# Patient Record
Sex: Female | Born: 1940 | Race: Black or African American | Hispanic: No | Smoking: Never smoker
Health system: Southern US, Community
[De-identification: ages and names within clinical notes are randomized; demographics above are authoritative.]

## PROBLEM LIST (undated history)

## (undated) DIAGNOSIS — E079 Disorder of thyroid, unspecified: Secondary | ICD-10-CM

## (undated) DIAGNOSIS — K219 Gastro-esophageal reflux disease without esophagitis: Secondary | ICD-10-CM

## (undated) DIAGNOSIS — I10 Essential (primary) hypertension: Secondary | ICD-10-CM

## (undated) DIAGNOSIS — Z974 Presence of external hearing-aid: Secondary | ICD-10-CM

## (undated) DIAGNOSIS — J45909 Unspecified asthma, uncomplicated: Secondary | ICD-10-CM

## (undated) DIAGNOSIS — Z972 Presence of dental prosthetic device (complete) (partial): Secondary | ICD-10-CM

---

## 2011-02-24 ENCOUNTER — Encounter (HOSPITAL_BASED_OUTPATIENT_CLINIC_OR_DEPARTMENT_OTHER): Payer: Self-pay | Admitting: *Deleted

## 2011-02-24 ENCOUNTER — Emergency Department (HOSPITAL_BASED_OUTPATIENT_CLINIC_OR_DEPARTMENT_OTHER)
Admission: EM | Admit: 2011-02-24 | Discharge: 2011-02-24 | Disposition: A | Discharge status: Home / Self Care | Payer: Medicare Other | Attending: Emergency Medicine | Admitting: Emergency Medicine

## 2011-02-24 DIAGNOSIS — R040 Epistaxis: Secondary | ICD-10-CM | POA: Insufficient documentation

## 2011-02-24 DIAGNOSIS — I10 Essential (primary) hypertension: Secondary | ICD-10-CM | POA: Insufficient documentation

## 2011-02-24 DIAGNOSIS — E079 Disorder of thyroid, unspecified: Secondary | ICD-10-CM | POA: Insufficient documentation

## 2011-02-24 HISTORY — DX: Essential (primary) hypertension: I10

## 2011-02-24 HISTORY — DX: Disorder of thyroid, unspecified: E07.9

## 2011-02-24 MED ORDER — SILVER NITRATE-POT NITRATE 75-25 % EX MISC
1.0000 | Freq: Once | CUTANEOUS | Status: AC
  Administered 2011-02-24: 1 via TOPICAL

## 2011-02-24 MED ORDER — LIDOCAINE-EPINEPHRINE 2 %-1:100000 IJ SOLN
20.0000 mL | Freq: Once | INTRAMUSCULAR | Status: AC
  Administered 2011-02-24: 1 mL

## 2011-02-24 MED ORDER — SILVER NITRATE-POT NITRATE 75-25 % EX MISC
CUTANEOUS | Status: AC
  Filled 2011-02-24: qty 1

## 2011-02-24 MED ORDER — LIDOCAINE-EPINEPHRINE 2 %-1:100000 IJ SOLN
INTRAMUSCULAR | Status: AC
  Filled 2011-02-24: qty 1

## 2011-02-24 NOTE — ED Provider Notes (Signed)
History     CSN: [id number]  Arrival date & time 02/24/11  7829   First MD Initiated Contact with Patient 02/24/11 [phone]      Chief Complaint  Patient presents with  . Epistaxis    (Consider location/radiation/quality/duration/timing/severity/associated sxs/prior treatment) HPI Comments: Pt has hx of intermittent brief nose bleeds over the last few years presents with c/o R sided anterior nose bleed which started just after awakening.  The bleeding is mild, persistent, better with holding pressure, associated with a slight amount of blood dripping down the throat into the stomach. She is on intermittent baby aspirin. She denies other anticoagulation, denies trauma, denies picking.  Patient is a 71 y.o. female presenting with nosebleeds. The history is provided by the patient and a relative.  Epistaxis     Past Medical History  Diagnosis Date  . Thyroid disease   . Hypertension     History reviewed. No pertinent past surgical history.  No family history on file.  History  Substance Use Topics  . Smoking status: Never Smoker   . Smokeless tobacco: Not on file  . Alcohol Use:     OB History    Grav Para Term Preterm Abortions TAB SAB Ect Mult Living                  Review of Systems  HENT: Positive for nosebleeds.   Respiratory: Negative for cough and chest tightness.   Cardiovascular: Negative for chest pain.  Gastrointestinal: Negative for nausea, vomiting and diarrhea.  Skin: Negative for rash.  Neurological: Negative for headaches.    Allergies  Fish oil  Home Medications   Current Outpatient Rx  Name Route Sig Dispense Refill  . ASPIRIN 81 MG PO TABS Oral Take 160 mg by mouth daily.    Marland Kitchen ESOMEPRAZOLE MAGNESIUM 20 MG PO CPDR Oral Take 20 mg by mouth daily before breakfast.    . LEVOTHYROXINE SODIUM 100 MCG PO TABS Oral Take 100 mcg by mouth daily.    Marland Kitchen LISINOPRIL 10 MG PO TABS Oral Take 10 mg by mouth daily.      BP 162/67  Pulse 87  Resp 18   SpO2 100%  Physical Exam  Nursing note and vitals reviewed. Constitutional: She appears well-developed and well-nourished. No distress.  HENT:  Head: Normocephalic and atraumatic.  Mouth/Throat: Oropharynx is clear and moist. No oropharyngeal exudate.       Right nostril with focal anterior focus of bleed.  Eyes: Conjunctivae and EOM are normal. Pupils are equal, round, and reactive to light. Right eye exhibits no discharge. Left eye exhibits no discharge. No scleral icterus.  Neck: Normal range of motion. Neck supple. No JVD present. No thyromegaly present.  Cardiovascular: Normal rate, regular rhythm, normal heart sounds and intact distal pulses.  Exam reveals no gallop and no friction rub.   No murmur heard. Pulmonary/Chest: Effort normal and breath sounds normal. No respiratory distress. She has no wheezes. She has no rales.  Abdominal: Soft. Bowel sounds are normal. She exhibits no distension and no mass. There is no tenderness.  Musculoskeletal: Normal range of motion. She exhibits no edema and no tenderness.  Lymphadenopathy:    She has no cervical adenopathy.  Neurological: She is alert. Coordination normal.  Skin: Skin is warm and dry. No rash noted. No erythema.  Psychiatric: She has a normal mood and affect. Her behavior is normal.    ED Course  EPISTAXIS MANAGEMENT Date/Time: 02/24/2011 6:50 AM Performed by: Eber Hong D  Authorized by: Eber Hong D Consent: Verbal consent obtained. Risks and benefits: risks, benefits and alternatives were discussed Consent given by: patient Patient understanding: patient states understanding of the procedure being performed Patient identity confirmed: verbally with patient and arm band Time out: Immediately prior to procedure a "time out" was called to verify the correct patient, procedure, equipment, support staff and site/side marked as required. Local anesthetic: lidocaine 2% with epinephrine and topical anesthetic Anesthetic  total: 3 ml Patient sedated: no Treatment site: right anterior Post-procedure assessment: bleeding stopped Treatment complexity: simple Patient tolerance: Patient tolerated the procedure well with no immediate complications. Comments: Focal packing with cotton ball, lidocaine 2% with epi soak, compression. Removed after 20 minutes with cessation. Than focally cauterized with silver nitrate.   (including critical care time)  Labs Reviewed - No data to display No results found.   1. Epistaxis       MDM  Right nostril packed with cotton swabs, 2% lidocaine with epi subcutaneous swabs, PACs remain in place for approximately 20 minutes. When removed bleeding had stopped and focal site seen. Dental silver nitrate applied, good eschar in place, patient stable for discharge and referral to ear nose and throat.        Vida Roller, MD 02/24/11 [phone]

## 2011-02-24 NOTE — ED Notes (Addendum)
Patient has had a nose bleed starting around 6am. Patient is holding pressure. Patient has a history of nosebleeds and has seen an ENT in the past.

## 2012-11-24 ENCOUNTER — Encounter (HOSPITAL_BASED_OUTPATIENT_CLINIC_OR_DEPARTMENT_OTHER): Payer: Self-pay | Admitting: Emergency Medicine

## 2012-11-24 ENCOUNTER — Emergency Department (HOSPITAL_BASED_OUTPATIENT_CLINIC_OR_DEPARTMENT_OTHER)
Admission: EM | Admit: 2012-11-24 | Discharge: 2012-11-24 | Disposition: A | Discharge status: Home / Self Care | Payer: Medicare Other | Attending: Emergency Medicine | Admitting: Emergency Medicine

## 2012-11-24 ENCOUNTER — Emergency Department (HOSPITAL_BASED_OUTPATIENT_CLINIC_OR_DEPARTMENT_OTHER): Payer: Medicare Other

## 2012-11-24 DIAGNOSIS — Z7982 Long term (current) use of aspirin: Secondary | ICD-10-CM | POA: Insufficient documentation

## 2012-11-24 DIAGNOSIS — I10 Essential (primary) hypertension: Secondary | ICD-10-CM | POA: Insufficient documentation

## 2012-11-24 DIAGNOSIS — Z79899 Other long term (current) drug therapy: Secondary | ICD-10-CM | POA: Insufficient documentation

## 2012-11-24 DIAGNOSIS — N390 Urinary tract infection, site not specified: Secondary | ICD-10-CM | POA: Insufficient documentation

## 2012-11-24 DIAGNOSIS — K219 Gastro-esophageal reflux disease without esophagitis: Secondary | ICD-10-CM | POA: Insufficient documentation

## 2012-11-24 DIAGNOSIS — E079 Disorder of thyroid, unspecified: Secondary | ICD-10-CM | POA: Insufficient documentation

## 2012-11-24 HISTORY — DX: Gastro-esophageal reflux disease without esophagitis: K21.9

## 2012-11-24 LAB — COMPREHENSIVE METABOLIC PANEL
ALT: 17 U/L (ref 0–35)
Albumin: 3.9 g/dL (ref 3.5–5.2)
Alkaline Phosphatase: 74 U/L (ref 39–117)
Calcium: 10.1 mg/dL (ref 8.4–10.5)
Potassium: 3.4 mEq/L — ABNORMAL LOW (ref 3.5–5.1)
Sodium: 139 mEq/L (ref 135–145)
Total Protein: 8.3 g/dL (ref 6.0–8.3)

## 2012-11-24 LAB — URINALYSIS, ROUTINE W REFLEX MICROSCOPIC
Hgb urine dipstick: NEGATIVE
Protein, ur: NEGATIVE mg/dL
Urobilinogen, UA: 1 mg/dL (ref 0.0–1.0)

## 2012-11-24 LAB — CBC WITH DIFFERENTIAL/PLATELET
Basophils Relative: 0 % (ref 0–1)
Eosinophils Absolute: 0.1 10*3/uL (ref 0.0–0.7)
MCH: 27.2 pg (ref 26.0–34.0)
MCHC: 33.3 g/dL (ref 30.0–36.0)
Neutrophils Relative %: 74 % (ref 43–77)
Platelets: 251 10*3/uL (ref 150–400)
RDW: 14.9 % (ref 11.5–15.5)

## 2012-11-24 LAB — LIPASE, BLOOD: Lipase: 32 U/L (ref 11–59)

## 2012-11-24 LAB — URINE MICROSCOPIC-ADD ON

## 2012-11-24 MED ORDER — NITROFURANTOIN MONOHYD MACRO 100 MG PO CAPS: 100.0000 mg | ORAL_CAPSULE | Freq: Two times a day (BID) | ORAL | Status: DC

## 2012-11-24 MED ORDER — CEPHALEXIN 250 MG/5ML PO SUSR: 500.0000 mg | Freq: Three times a day (TID) | ORAL | Status: AC

## 2012-11-24 NOTE — ED Notes (Signed)
Periumbilical abd pain since 0700 this morning. Tender to touch.  GERD meds have not been working lately.  Burning in throat. Denies N/V/D.

## 2012-11-24 NOTE — ED Provider Notes (Signed)
CSN: [id number]     Arrival date & time 11/24/12  1437 History   First MD Initiated Contact with Patient 11/24/12 1544     Chief Complaint  Patient presents with  . Abdominal Pain   (Consider location/radiation/quality/duration/timing/severity/associated sxs/prior Treatment) HPI Pt presents with c/o mid abdominal pain.  She states pain began this morning and she felt that she needed to pass a bowel movement.  She was able to have a small bowel movement and pain did decrease somewhat.  She denies having fever, vomiting.  Did notice some burning with urination this morning.  She states she takes nexium for GERD but feels this is not working as well as when she first started it.  No chest pain.  No shortness of breath. There are no other associated systemic symptoms, there are no other alleviating or modifying factors.   Past Medical History  Diagnosis Date  . Thyroid disease   . Hypertension   . GERD (gastroesophageal reflux disease)    History reviewed. No pertinent past surgical history. No family history on file. History  Substance Use Topics  . Smoking status: Never Smoker   . Smokeless tobacco: Not on file  . Alcohol Use: No   OB History   Grav Para Term Preterm Abortions TAB SAB Ect Mult Living                 Review of Systems ROS reviewed and all otherwise negative except for mentioned in HPI  Allergies  Fish oil  Home Medications   Current Outpatient Rx  Name  Route  Sig  Dispense  Refill  . furosemide (LASIX) 20 MG tablet   Oral   Take 20 mg by mouth daily.         . montelukast (SINGULAIR) 10 MG tablet   Oral   Take 10 mg by mouth at bedtime.         Marland Kitchen aspirin 81 MG tablet   Oral   Take 160 mg by mouth daily.         Marland Kitchen esomeprazole (NEXIUM) 20 MG capsule   Oral   Take 20 mg by mouth daily before breakfast.         . levothyroxine (SYNTHROID, LEVOTHROID) 100 MCG tablet   Oral   Take 100 mcg by mouth daily.         Marland Kitchen lisinopril  (PRINIVIL,ZESTRIL) 10 MG tablet   Oral   Take 10 mg by mouth daily.         . nitrofurantoin, macrocrystal-monohydrate, (MACROBID) 100 MG capsule   Oral   Take 1 capsule (100 mg total) by mouth 2 (two) times daily.   14 capsule   0    BP 145/71  Pulse 87  Temp(Src) 98.3 F (36.8 C) (Oral)  Resp 16  Ht 5\' 3"  (1.6 m)  Wt 172 lb (78.019 kg)  BMI 30.48 kg/m2  SpO2 98% Vitals reviewed Physical Exam Physical Examination: General appearance - alert, well appearing, and in no distress Mental status - alert, oriented to person, place, and time Eyes - no conjunctival injection, no scleral icterus Mouth - mucous membranes moist, pharynx normal without lesions Chest - clear to auscultation, no wheezes, rales or rhonchi, symmetric air entry Heart - normal rate, regular rhythm, normal S1, S2, no murmurs, rubs, clicks or gallops Abdomen - soft, mild diffuse tenderness to palpation, nondistended, no masses or organomegaly, nabs Extremities - peripheral pulses normal, no pedal edema, no clubbing or cyanosis Skin - normal  coloration and turgor, no rashes  ED Course  Procedures (including critical care time) Labs Review Labs Reviewed  URINALYSIS, ROUTINE W REFLEX MICROSCOPIC - Abnormal; Notable for the following:    APPearance CLOUDY (*)    Bilirubin Urine SMALL (*)    Nitrite POSITIVE (*)    Leukocytes, UA SMALL (*)    All other components within normal limits  URINE MICROSCOPIC-ADD ON - Abnormal; Notable for the following:    Squamous Epithelial / LPF FEW (*)    Bacteria, UA MANY (*)    All other components within normal limits  COMPREHENSIVE METABOLIC PANEL - Abnormal; Notable for the following:    Potassium 3.4 (*)    Glucose, Bld 121 (*)    GFR calc non Af Amer 54 (*)    GFR calc Af Amer 63 (*)    All other components within normal limits  URINE CULTURE  CBC WITH DIFFERENTIAL  LIPASE, BLOOD   Imaging Review Dg Abd 2 Views  11/24/2012   CLINICAL DATA:  Abdominal pain.   EXAM: ABDOMEN - 2 VIEW  COMPARISON:  None.  FINDINGS: Calcifications in the pelvis compatible with phleboliths. Nonobstructive bowel gas pattern. No free air, organomegaly or acute bony abnormality. Left basilar scarring or atelectasis. Right lung base is clear.  IMPRESSION: No evidence of bowel obstruction or free air.   Electronically Signed   By: Charlett Nose M.D.   On: 11/24/2012 16:28      EKG Interpretation     Ventricular Rate:  95 PR Interval:    QRS Duration: 140 QT Interval:  384 QTC Calculation: 482 R Axis:   -70 Text Interpretation:  Wide QRS rhythm with occasional Premature ventricular complexes Right bundle branch block Left anterior fascicular block Left ventricular hypertrophy with repolarization abnormality Cannot rule out Septal infarct , age undetermined Abnormal ECG No old tracing to compare               MDM   1. Urinary tract infection    Pt presenting with c/o diffuse abdominal pain.  Labs reassuring, no significant tenderness on abdominal exam.  UA c/w UTI.  Will start on macrobid.  xrays also reaassuring.  Xray images reviewed and interpreted by me.  Low suspicion of acute emergent condition requiring further treatment in the ED.  Close f/u with PMD recommended.  Discharged with strict return precautions.  Pt agreeable with plan.    Ethelda Chick, MD 11/24/12 [phone]

## 2012-11-24 NOTE — ED Notes (Signed)
Pt. Reports her son drove her here.

## 2012-11-25 LAB — URINE CULTURE: Colony Count: >=100000

## 2013-04-30 ENCOUNTER — Emergency Department (HOSPITAL_BASED_OUTPATIENT_CLINIC_OR_DEPARTMENT_OTHER): Payer: Medicare Other

## 2013-04-30 ENCOUNTER — Emergency Department (HOSPITAL_BASED_OUTPATIENT_CLINIC_OR_DEPARTMENT_OTHER)
Admission: EM | Admit: 2013-04-30 | Discharge: 2013-04-30 | Disposition: A | Discharge status: Home / Self Care | Payer: Medicare Other | Attending: Emergency Medicine | Admitting: Emergency Medicine

## 2013-04-30 ENCOUNTER — Encounter (HOSPITAL_BASED_OUTPATIENT_CLINIC_OR_DEPARTMENT_OTHER): Payer: Self-pay | Admitting: Emergency Medicine

## 2013-04-30 DIAGNOSIS — I10 Essential (primary) hypertension: Secondary | ICD-10-CM | POA: Insufficient documentation

## 2013-04-30 DIAGNOSIS — N289 Disorder of kidney and ureter, unspecified: Secondary | ICD-10-CM | POA: Insufficient documentation

## 2013-04-30 DIAGNOSIS — J45909 Unspecified asthma, uncomplicated: Secondary | ICD-10-CM | POA: Insufficient documentation

## 2013-04-30 DIAGNOSIS — Z7982 Long term (current) use of aspirin: Secondary | ICD-10-CM | POA: Insufficient documentation

## 2013-04-30 DIAGNOSIS — E079 Disorder of thyroid, unspecified: Secondary | ICD-10-CM | POA: Insufficient documentation

## 2013-04-30 DIAGNOSIS — Z79899 Other long term (current) drug therapy: Secondary | ICD-10-CM | POA: Insufficient documentation

## 2013-04-30 DIAGNOSIS — K219 Gastro-esophageal reflux disease without esophagitis: Secondary | ICD-10-CM | POA: Insufficient documentation

## 2013-04-30 DIAGNOSIS — R42 Dizziness and giddiness: Secondary | ICD-10-CM | POA: Insufficient documentation

## 2013-04-30 HISTORY — DX: Unspecified asthma, uncomplicated: J45.909

## 2013-04-30 LAB — COMPREHENSIVE METABOLIC PANEL
ALBUMIN: 3.9 g/dL (ref 3.5–5.2)
ALT: 16 U/L (ref 0–35)
AST: 26 U/L (ref 0–37)
Alkaline Phosphatase: 77 U/L (ref 39–117)
BUN: 22 mg/dL (ref 6–23)
CALCIUM: 9.8 mg/dL (ref 8.4–10.5)
CO2: 27 mEq/L (ref 19–32)
Chloride: 98 mEq/L (ref 96–112)
Creatinine, Ser: 1.5 mg/dL — ABNORMAL HIGH (ref 0.50–1.10)
GFR calc non Af Amer: 33 mL/min — ABNORMAL LOW (ref >90)
GFR, EST AFRICAN AMERICAN: 38 mL/min — AB (ref >90)
GLUCOSE: 108 mg/dL — AB (ref 70–99)
Potassium: 3.8 mEq/L (ref 3.7–5.3)
SODIUM: 139 meq/L (ref 137–147)
TOTAL PROTEIN: 8 g/dL (ref 6.0–8.3)
Total Bilirubin: 0.4 mg/dL (ref 0.3–1.2)

## 2013-04-30 LAB — TROPONIN I

## 2013-04-30 LAB — CBC WITH DIFFERENTIAL/PLATELET
Basophils Absolute: 0 10*3/uL (ref 0.0–0.1)
Basophils Relative: 1 % (ref 0–1)
EOS ABS: 0.1 10*3/uL (ref 0.0–0.7)
Eosinophils Relative: 3 % (ref 0–5)
HCT: 36 % (ref 36.0–46.0)
HEMOGLOBIN: 11.9 g/dL — AB (ref 12.0–15.0)
Lymphocytes Relative: 31 % (ref 12–46)
Lymphs Abs: 1.6 10*3/uL (ref 0.7–4.0)
MCH: 27.9 pg (ref 26.0–34.0)
MCHC: 33.1 g/dL (ref 30.0–36.0)
MCV: 84.3 fL (ref 78.0–100.0)
Monocytes Absolute: 0.8 10*3/uL (ref 0.1–1.0)
Monocytes Relative: 15 % — ABNORMAL HIGH (ref 3–12)
Neutro Abs: 2.7 10*3/uL (ref 1.7–7.7)
Neutrophils Relative %: 51 % (ref 43–77)
PLATELETS: 237 10*3/uL (ref 150–400)
RBC: 4.27 MIL/uL (ref 3.87–5.11)
RDW: 14.4 % (ref 11.5–15.5)
WBC: 5.2 10*3/uL (ref 4.0–10.5)

## 2013-04-30 LAB — URINALYSIS, ROUTINE W REFLEX MICROSCOPIC
Bilirubin Urine: NEGATIVE
Glucose, UA: NEGATIVE mg/dL
Hgb urine dipstick: NEGATIVE
Ketones, ur: NEGATIVE mg/dL
Leukocytes, UA: NEGATIVE
NITRITE: NEGATIVE
PH: 7 (ref 5.0–8.0)
Protein, ur: NEGATIVE mg/dL
SPECIFIC GRAVITY, URINE: 1.009 (ref 1.005–1.030)
Urobilinogen, UA: 1 mg/dL (ref 0.0–1.0)

## 2013-04-30 MED ORDER — SODIUM CHLORIDE 0.9 % IV BOLUS (SEPSIS)
500.0000 mL | Freq: Once | INTRAVENOUS | Status: AC
  Administered 2013-04-30: 500 mL via INTRAVENOUS

## 2013-04-30 MED ORDER — SODIUM CHLORIDE 0.9 % IV BOLUS (SEPSIS): 250.0000 mL | Freq: Once | INTRAVENOUS | Status: DC

## 2013-04-30 MED ORDER — SODIUM CHLORIDE 0.9 % IV SOLN: INTRAVENOUS | Status: DC

## 2013-04-30 NOTE — ED Notes (Signed)
MD at bedside. 

## 2013-04-30 NOTE — ED Notes (Signed)
Pt resting quielty, describes and episode today of "my feet wouldn't go where I wanted them to go..." pt states episode lasted a few minutes, and has had no c/o since. Denies any ha, numbness, tingling, weakness or other c/o.

## 2013-04-30 NOTE — ED Notes (Signed)
Patient transported to X-ray 

## 2013-04-30 NOTE — Discharge Instructions (Signed)
Dizziness Dizziness is a common problem. It is a feeling of unsteadiness or lightheadedness. You may feel like you are about to faint. Dizziness can lead to injury if you stumble or fall. A person of any age group can suffer from dizziness, but dizziness is more common in older adults. CAUSES  Dizziness can be caused by many different things, including:  Middle ear problems.  Standing for too long.  Infections.  An allergic reaction.  Aging.  An emotional response to something, such as the sight of blood.  Side effects of medicines.  Fatigue.  Problems with circulation or blood pressure.  Excess use of alcohol, medicines, or illegal drug use.  Breathing too fast (hyperventilation).  An arrhythmia or problems with your heart rhythm.  Low red blood cell count (anemia).  Pregnancy.  Vomiting, diarrhea, fever, or other illnesses that cause dehydration.  Diseases or conditions such as Parkinson's disease, high blood pressure (hypertension), diabetes, and thyroid problems.  Exposure to extreme heat. DIAGNOSIS  To find the cause of your dizziness, your caregiver may do a physical exam, lab tests, radiologic imaging scans, or an electrocardiography test (ECG).  TREATMENT  Treatment of dizziness depends on the cause of your symptoms and can vary greatly. HOME CARE INSTRUCTIONS   Drink enough fluids to keep your urine clear or pale yellow. This is especially important in very hot weather. In the elderly, it is also important in cold weather.  If your dizziness is caused by medicines, take them exactly as directed. When taking blood pressure medicines, it is especially important to get up slowly.  Rise slowly from chairs and steady yourself until you feel okay.  In the morning, first sit up on the side of the bed. When this seems okay, stand slowly while holding onto something until you know your balance is fine.  If you need to stand in one place for a long time, be sure to  move your legs often. Tighten and relax the muscles in your legs while standing.  If dizziness continues to be a problem, have someone stay with you for a day or two. Do this until you feel you are well enough to stay alone. Have the person call your caregiver if he or she notices changes in you that are concerning.  Do not drive or use heavy machinery if you feel dizzy.  Do not drink alcohol. SEEK IMMEDIATE MEDICAL CARE IF:   Your dizziness or lightheadedness gets worse.  You feel nauseous or vomit.  You develop problems with talking, walking, weakness, or using your arms, hands, or legs.  You are not thinking clearly or you have difficulty forming sentences. It may take a friend or family member to determine if your thinking is normal.  You develop chest pain, abdominal pain, shortness of breath, or sweating.  Your vision changes.  You notice any bleeding.  You have side effects from medicine that seems to be getting worse rather than better. MAKE SURE YOU:   Understand these instructions.  Will watch your condition.  Will get help right away if you are not doing well or get worse. Document Released: 07/10/2000 Document Revised: 04/08/2011 Document Reviewed: 08/03/2010 [facility] Patient Information 2014 [place], [place].  Workup here was negative. For any significant findings. Return for any newer worse symptoms. If he still experienced some mild dizziness just followup with your regular Dr.  Renal function is a little worse than it has been in the past. Recommend that she have this rechecked next week  by your regular Dr.

## 2013-04-30 NOTE — ED Notes (Signed)
Pt states she was at home when "my feet wouldn't work right and i got dizzy"-A/O-denies pain

## 2013-04-30 NOTE — ED Provider Notes (Signed)
CSN: [id number]     Arrival date & time 04/30/13  1519 History   First MD Initiated Contact with Patient 04/30/13 1538     Chief Complaint  Patient presents with  . Dizziness     (Consider location/radiation/quality/duration/timing/severity/associated sxs/prior Treatment) The history is provided by the patient.   73 year old female with acute onset of some dizziness. Approximately 15 minutes prior to arrival lasting just 10 minutes. Was gone by the time she got here. Not true vertigo the room spinning did not feel like you pass out. No headache no nausea vomiting no fevers no other symptoms. Patient's had this happen in the past as been a while. Patient is followed by [facility]. Patient denies headache did have some generalized weakness but no focal weakness. Stated the date was a little bit off with walking. No neck pain no back pain.  Past Medical History  Diagnosis Date  . Thyroid disease   . Hypertension   . GERD (gastroesophageal reflux disease)   . Asthma    History reviewed. No pertinent past surgical history. No family history on file. History  Substance Use Topics  . Smoking status: Never Smoker   . Smokeless tobacco: Not on file  . Alcohol Use: No   OB History   Grav Para Term Preterm Abortions TAB SAB Ect Mult Living                 Review of Systems  Constitutional: Positive for fatigue. Negative for fever.  HENT: Negative for congestion.   Eyes: Negative for visual disturbance.  Respiratory: Negative for shortness of breath.   Cardiovascular: Negative for chest pain.  Gastrointestinal: Negative for nausea, vomiting and abdominal pain.  Musculoskeletal: Negative for neck pain.  Skin: Negative for rash.  Neurological: Positive for dizziness and weakness. Negative for syncope, speech difficulty, light-headedness, numbness and headaches.      Allergies  Fish oil  Home Medications   Current Outpatient Rx  Name  Route  Sig  Dispense   Refill  . aspirin 81 MG tablet   Oral   Take 160 mg by mouth daily.         Marland Kitchen esomeprazole (NEXIUM) 20 MG capsule   Oral   Take 20 mg by mouth daily before breakfast.         . furosemide (LASIX) 20 MG tablet   Oral   Take 20 mg by mouth daily.         Marland Kitchen levothyroxine (SYNTHROID, LEVOTHROID) 100 MCG tablet   Oral   Take 100 mcg by mouth daily.         Marland Kitchen lisinopril (PRINIVIL,ZESTRIL) 10 MG tablet   Oral   Take 10 mg by mouth daily.         . montelukast (SINGULAIR) 10 MG tablet   Oral   Take 10 mg by mouth at bedtime.          BP 132/70  Pulse 83  Temp(Src) 98.1 F (36.7 C) (Oral)  Resp 20  Ht 5\' 8"  (1.727 m)  Wt 169 lb (76.658 kg)  BMI 25.70 kg/m2  SpO2 100% Physical Exam  Nursing note and vitals reviewed. Constitutional: She is oriented to person, place, and time. She appears well-developed and well-nourished. No distress.  HENT:  Head: Normocephalic and atraumatic.  Eyes: Conjunctivae and EOM are normal. Pupils are equal, round, and reactive to light.  Neck: Normal range of motion.  Cardiovascular: Normal rate, regular rhythm and normal heart sounds.  No murmur heard. Pulmonary/Chest: Effort normal. No respiratory distress.  Abdominal: Soft. Bowel sounds are normal. There is no tenderness.  Neurological: She is alert and oriented to person, place, and time. No cranial nerve deficit. She exhibits normal muscle tone. Coordination normal.  Skin: Skin is warm. No rash noted.    ED Course  Procedures (including critical care time) Labs Review Labs Reviewed  COMPREHENSIVE METABOLIC PANEL - Abnormal; Notable for the following:    Glucose, Bld 108 (*)    Creatinine, Ser 1.50 (*)    GFR calc non Af Amer 33 (*)    GFR calc Af Amer 38 (*)    All other components within normal limits  CBC WITH DIFFERENTIAL - Abnormal; Notable for the following:    Hemoglobin 11.9 (*)    Monocytes Relative 15 (*)    All other components within normal limits  TROPONIN  I  URINALYSIS, ROUTINE W REFLEX MICROSCOPIC   Results for orders placed during the hospital encounter of 04/30/13  COMPREHENSIVE METABOLIC PANEL      Result Value Ref Range   Sodium 139  137 - 147 mEq/L   Potassium 3.8  3.7 - 5.3 mEq/L   Chloride 98  96 - 112 mEq/L   CO2 27  19 - 32 mEq/L   Glucose, Bld 108 (*) 70 - 99 mg/dL   BUN 22  6 - 23 mg/dL   Creatinine, Ser 1.611.50 (*) 0.50 - 1.10 mg/dL   Calcium 9.8  8.4 - 09.610.5 mg/dL   Total Protein 8.0  6.0 - 8.3 g/dL   Albumin 3.9  3.5 - 5.2 g/dL   AST 26  0 - 37 U/L   ALT 16  0 - 35 U/L   Alkaline Phosphatase 77  39 - 117 U/L   Total Bilirubin 0.4  0.3 - 1.2 mg/dL   GFR calc non Af Amer 33 (*) >90 mL/min   GFR calc Af Amer 38 (*) >90 mL/min  TROPONIN I      Result Value Ref Range   Troponin I <0.30  <0.30 ng/mL  URINALYSIS, ROUTINE W REFLEX MICROSCOPIC      Result Value Ref Range   Color, Urine YELLOW  YELLOW   APPearance CLEAR  CLEAR   Specific Gravity, Urine 1.009  1.005 - 1.030   pH 7.0  5.0 - 8.0   Glucose, UA NEGATIVE  NEGATIVE mg/dL   Hgb urine dipstick NEGATIVE  NEGATIVE   Bilirubin Urine NEGATIVE  NEGATIVE   Ketones, ur NEGATIVE  NEGATIVE mg/dL   Protein, ur NEGATIVE  NEGATIVE mg/dL   Urobilinogen, UA 1.0  0.0 - 1.0 mg/dL   Nitrite NEGATIVE  NEGATIVE   Leukocytes, UA NEGATIVE  NEGATIVE  CBC WITH DIFFERENTIAL      Result Value Ref Range   WBC 5.2  4.0 - 10.5 K/uL   RBC 4.27  3.87 - 5.11 MIL/uL   Hemoglobin 11.9 (*) 12.0 - 15.0 g/dL   HCT 04.536.0  40.936.0 - 81.146.0 %   MCV 84.3  78.0 - 100.0 fL   MCH 27.9  26.0 - 34.0 pg   MCHC 33.1  30.0 - 36.0 g/dL   RDW 91.414.4  78.211.5 - 95.615.5 %   Platelets 237  150 - 400 K/uL   Neutrophils Relative % 51  43 - 77 %   Neutro Abs 2.7  1.7 - 7.7 K/uL   Lymphocytes Relative 31  12 - 46 %   Lymphs Abs 1.6  0.7 - 4.0 K/uL  Monocytes Relative 15 (*) 3 - 12 %   Monocytes Absolute 0.8  0.1 - 1.0 K/uL   Eosinophils Relative 3  0 - 5 %   Eosinophils Absolute 0.1  0.0 - 0.7 K/uL   Basophils Relative  1  0 - 1 %   Basophils Absolute 0.0  0.0 - 0.1 K/uL    Imaging Review Dg Chest 2 View  04/30/2013   CLINICAL DATA:  Dizziness  EXAM: CHEST  2 VIEW  COMPARISON:  None.  FINDINGS: There is no edema or consolidation. Heart is upper normal in size with pulmonary vascularity are normal. No adenopathy. There is degenerative change in the thoracic spine.  IMPRESSION: No edema or consolidation.   Electronically Signed   By: Bretta Bang M.D.   On: 04/30/2013 16:38   Ct Head Wo Contrast  04/30/2013   CLINICAL DATA:  Dizziness  EXAM: CT HEAD WITHOUT CONTRAST  TECHNIQUE: Contiguous axial images were obtained from the base of the skull through the vertex without intravenous contrast. Study was obtained within 24 hr of patient's arrival at the emergency department.  COMPARISON:  None.  FINDINGS: There is age related volume loss. There is a cavum septum pellucidum, an anatomic variant. There is no mass, hemorrhage, extra-axial fluid collection, or midline shift. Gray-white compartments appear normal. There is no focal acute infarct appreciable.  Bony calvarium appears intact.  The mastoid air cells are clear.  IMPRESSION: Age related volume loss.  Study otherwise unremarkable.   Electronically Signed   By: Bretta Bang M.D.   On: 04/30/2013 16:36     EKG Interpretation   Date/Time:  Friday April 30 2013 15:36:07 EDT Ventricular Rate:  0 PR Interval:    QRS Duration: 0 QT Interval:  0 QTC Calculation: 0 R Axis:   0 Text Interpretation:   No QRS complexes found, no ECG analysis possible  Confirmed by Leyton Brownlee  MD, Angelos Wasco [phone]) on 04/30/2013 3:42:10 PM Also  confirmed by Deretha Emory  MD, Phill Steck [phone])  on 04/30/2013 3:42:49 PM       Date: 04/30/2013  Rate: 84  Rhythm: normal sinus rhythm  QRS Axis: left  Intervals: normal  ST/T Wave abnormalities: nonspecific ST/T changes and early repolarization  Conduction Disutrbances:right bundle branch block and left anterior fascicular block  Narrative  Interpretation:   Old EKG Reviewed: unchanged EKG is unchanged from 2014. EKG this is above-the-knee use was a blank EKG. EKG was also done today.    MDM   Final diagnoses:  Dizziness  Renal insufficiency   The patient's dizziness symptoms have resolved. Not consistent with true vertigo. Patient's neuro exam without any focal deficits. Symptoms are very brief and lasted 10 minutes at most. Patient has had similar symptoms in the past but not recently. Head CT negative for any evidence of bleed or prior stroke. EKG without any significant arrhythmias. Labs normal no leukocytosis or anemia only exception to the labs would be the creatinine is elevated at 1.5 suggests of her renals insufficiency and repeat the kidney function test will need to be done next week. Patient will return for any new or worse symptoms. Chest x-ray is also negative for pneumonia pneumothorax or pulmonary edema. Patient's troponin was also negative.  Clinically do not think this was related to a stroke or TIA.  Shelda Jakes, MD 04/30/13 [phone]

## 2013-10-30 ENCOUNTER — Emergency Department (HOSPITAL_BASED_OUTPATIENT_CLINIC_OR_DEPARTMENT_OTHER)
Admission: EM | Admit: 2013-10-30 | Discharge: 2013-10-30 | Disposition: A | Discharge status: Home / Self Care | Payer: Medicare Other | Attending: Emergency Medicine | Admitting: Emergency Medicine

## 2013-10-30 ENCOUNTER — Encounter (HOSPITAL_BASED_OUTPATIENT_CLINIC_OR_DEPARTMENT_OTHER): Payer: Self-pay | Admitting: Emergency Medicine

## 2013-10-30 DIAGNOSIS — Z79899 Other long term (current) drug therapy: Secondary | ICD-10-CM | POA: Insufficient documentation

## 2013-10-30 DIAGNOSIS — Z7982 Long term (current) use of aspirin: Secondary | ICD-10-CM | POA: Diagnosis not present

## 2013-10-30 DIAGNOSIS — J45909 Unspecified asthma, uncomplicated: Secondary | ICD-10-CM | POA: Diagnosis not present

## 2013-10-30 DIAGNOSIS — R112 Nausea with vomiting, unspecified: Secondary | ICD-10-CM | POA: Diagnosis present

## 2013-10-30 DIAGNOSIS — N3000 Acute cystitis without hematuria: Secondary | ICD-10-CM | POA: Diagnosis not present

## 2013-10-30 DIAGNOSIS — K219 Gastro-esophageal reflux disease without esophagitis: Secondary | ICD-10-CM | POA: Diagnosis not present

## 2013-10-30 DIAGNOSIS — I10 Essential (primary) hypertension: Secondary | ICD-10-CM | POA: Insufficient documentation

## 2013-10-30 DIAGNOSIS — E079 Disorder of thyroid, unspecified: Secondary | ICD-10-CM | POA: Diagnosis not present

## 2013-10-30 LAB — COMPREHENSIVE METABOLIC PANEL
ALK PHOS: 76 U/L (ref 39–117)
ALT: 12 U/L (ref 0–35)
AST: 20 U/L (ref 0–37)
Albumin: 3.5 g/dL (ref 3.5–5.2)
Anion gap: 15 (ref 5–15)
BILIRUBIN TOTAL: 0.5 mg/dL (ref 0.3–1.2)
BUN: 15 mg/dL (ref 6–23)
CHLORIDE: 102 meq/L (ref 96–112)
CO2: 26 meq/L (ref 19–32)
Calcium: 9.8 mg/dL (ref 8.4–10.5)
Creatinine, Ser: 1 mg/dL (ref 0.50–1.10)
GFR calc Af Amer: 62 mL/min — ABNORMAL LOW (ref >90)
GFR, EST NON AFRICAN AMERICAN: 54 mL/min — AB (ref >90)
Glucose, Bld: 165 mg/dL — ABNORMAL HIGH (ref 70–99)
Potassium: 3.4 mEq/L — ABNORMAL LOW (ref 3.7–5.3)
SODIUM: 143 meq/L (ref 137–147)
Total Protein: 7.7 g/dL (ref 6.0–8.3)

## 2013-10-30 LAB — CBC WITH DIFFERENTIAL/PLATELET
Basophils Absolute: 0 10*3/uL (ref 0.0–0.1)
Basophils Relative: 0 % (ref 0–1)
Eosinophils Absolute: 0 10*3/uL (ref 0.0–0.7)
Eosinophils Relative: 0 % (ref 0–5)
HEMATOCRIT: 38.7 % (ref 36.0–46.0)
Hemoglobin: 12.7 g/dL (ref 12.0–15.0)
LYMPHS PCT: 16 % (ref 12–46)
Lymphs Abs: 1.5 10*3/uL (ref 0.7–4.0)
MCH: 27.3 pg (ref 26.0–34.0)
MCHC: 32.8 g/dL (ref 30.0–36.0)
MCV: 83.2 fL (ref 78.0–100.0)
Monocytes Absolute: 0.6 10*3/uL (ref 0.1–1.0)
Monocytes Relative: 7 % (ref 3–12)
NEUTROS ABS: 6.8 10*3/uL (ref 1.7–7.7)
Neutrophils Relative %: 77 % (ref 43–77)
PLATELETS: 226 10*3/uL (ref 150–400)
RBC: 4.65 MIL/uL (ref 3.87–5.11)
RDW: 14.4 % (ref 11.5–15.5)
WBC: 8.9 10*3/uL (ref 4.0–10.5)

## 2013-10-30 LAB — URINALYSIS, ROUTINE W REFLEX MICROSCOPIC
Bilirubin Urine: NEGATIVE
GLUCOSE, UA: NEGATIVE mg/dL
Hgb urine dipstick: NEGATIVE
Ketones, ur: NEGATIVE mg/dL
Nitrite: POSITIVE — AB
PROTEIN: NEGATIVE mg/dL
Specific Gravity, Urine: 1.017 (ref 1.005–1.030)
UROBILINOGEN UA: 1 mg/dL (ref 0.0–1.0)
pH: 7.5 (ref 5.0–8.0)

## 2013-10-30 LAB — URINE MICROSCOPIC-ADD ON

## 2013-10-30 MED ORDER — ONDANSETRON 4 MG PO TBDP: 4.0000 mg | ORAL_TABLET | Freq: Three times a day (TID) | ORAL | Status: DC | PRN

## 2013-10-30 MED ORDER — CEPHALEXIN 500 MG PO CAPS: 500.0000 mg | ORAL_CAPSULE | Freq: Two times a day (BID) | ORAL | Status: DC

## 2013-10-30 MED ORDER — SODIUM CHLORIDE 0.9 % IV BOLUS (SEPSIS)
500.0000 mL | Freq: Once | INTRAVENOUS | Status: AC
  Administered 2013-10-30: 500 mL via INTRAVENOUS

## 2013-10-30 MED ORDER — CEFTRIAXONE SODIUM 1 G IJ SOLR
INTRAMUSCULAR | Status: AC
  Filled 2013-10-30: qty 10

## 2013-10-30 MED ORDER — DEXTROSE 5 % IV SOLN
1.0000 g | Freq: Once | INTRAVENOUS | Status: AC
  Administered 2013-10-30: 1 g via INTRAVENOUS

## 2013-10-30 MED ORDER — ONDANSETRON HCL 4 MG/2ML IJ SOLN
4.0000 mg | Freq: Once | INTRAMUSCULAR | Status: AC
  Administered 2013-10-30: 4 mg via INTRAVENOUS
  Filled 2013-10-30: qty 2

## 2013-10-30 NOTE — ED Notes (Signed)
Pt sts she ate at about 3:00pm on Friday and then around 7:30, she began vomiting and having abd pain.

## 2013-10-30 NOTE — ED Provider Notes (Signed)
CSN: [id number]     Arrival date & time 10/30/13  0131 History   First MD Initiated Contact with Patient 10/30/13 0143     Chief Complaint  Patient presents with  . Vomiting      (Consider location/radiation/quality/duration/timing/severity/associated sxs/prior Treatment) HPI This is a 73 year old female who started "feeling bad" yesterday afternoon about 3:30 after eating a sandwich. She began vomiting about 6:30 PM yesterday evening. She has vomited 3 times, the most severe episode just prior to arrival. Her nausea has improved. She denies diarrhea. She denies fever. She has had some mild abdominal pain which she describes as "gas".   Past Medical History  Diagnosis Date  . Thyroid disease   . Hypertension   . GERD (gastroesophageal reflux disease)   . Asthma    History reviewed. No pertinent past surgical history. No family history on file. History  Substance Use Topics  . Smoking status: Never Smoker   . Smokeless tobacco: Not on file  . Alcohol Use: No   OB History   Grav Para Term Preterm Abortions TAB SAB Ect Mult Living                 Review of Systems  All other systems reviewed and are negative.   Allergies  Fish oil  Home Medications   Prior to Admission medications   Medication Sig Start Date End Date Taking? Authorizing Provider  aspirin 81 MG tablet Take 160 mg by mouth daily.    Historical Provider, MD  esomeprazole (NEXIUM) 20 MG capsule Take 40 mg by mouth daily before breakfast.     Historical Provider, MD  furosemide (LASIX) 20 MG tablet Take 20 mg by mouth daily.    Historical Provider, MD  levothyroxine (SYNTHROID, LEVOTHROID) 100 MCG tablet Take 250 mcg by mouth daily.     Historical Provider, MD  lisinopril (PRINIVIL,ZESTRIL) 10 MG tablet Take 10 mg by mouth daily.    Historical Provider, MD  montelukast (SINGULAIR) 10 MG tablet Take 10 mg by mouth at bedtime.    Historical Provider, MD   BP 134/69  Pulse 84  Temp(Src) 98.2 F (36.8 C)  (Oral)  Resp 18  Ht 5\' 6"  (1.676 m)  Wt 163 lb (73.936 kg)  BMI 26.32 kg/m2  SpO2 99%  Physical Exam General: Well-developed, well-nourished female in no acute distress; appearance consistent with age of record HENT: normocephalic; atraumatic Eyes: pupils equal, round and reactive to light; extraocular muscles intact; arcus senilis bilaterally; left ptosis Neck: supple Heart: regular rate and rhythm Lungs: clear to auscultation bilaterally Abdomen: soft; nondistended; nontender; no masses or hepatosplenomegaly; bowel sounds present Extremities: No deformity; full range of motion; pulses normal Neurologic: Awake, alert and oriented; motor function intact in all extremities and symmetric; no facial droop Skin: Warm and dry Psychiatric: Normal mood and affect    ED Course  Procedures (including critical care time)  MDM  Nursing notes and vitals signs, including pulse oximetry, reviewed.  Summary of this visit's results, reviewed by myself:  Labs:  Results for orders placed during the hospital encounter of 10/30/13 (from the past 24 hour(s))  URINALYSIS, ROUTINE W REFLEX MICROSCOPIC     Status: Abnormal   Collection Time    10/30/13  1:38 AM      Result Value Ref Range   Color, Urine YELLOW  YELLOW   APPearance CLOUDY (*) CLEAR   Specific Gravity, Urine 1.017  1.005 - 1.030   pH 7.5  5.0 - 8.0  Glucose, UA NEGATIVE  NEGATIVE mg/dL   Hgb urine dipstick NEGATIVE  NEGATIVE   Bilirubin Urine NEGATIVE  NEGATIVE   Ketones, ur NEGATIVE  NEGATIVE mg/dL   Protein, ur NEGATIVE  NEGATIVE mg/dL   Urobilinogen, UA 1.0  0.0 - 1.0 mg/dL   Nitrite POSITIVE (*) NEGATIVE   Leukocytes, UA SMALL (*) NEGATIVE  URINE MICROSCOPIC-ADD ON     Status: Abnormal   Collection Time    10/30/13  1:38 AM      Result Value Ref Range   Squamous Epithelial / LPF FEW (*) RARE   WBC, UA 3-6  <3 WBC/hpf   RBC / HPF 0-2  <3 RBC/hpf   Bacteria, UA MANY (*) RARE   Casts HYALINE CASTS (*) NEGATIVE  CBC  WITH DIFFERENTIAL     Status: None   Collection Time    10/30/13  1:54 AM      Result Value Ref Range   WBC 8.9  4.0 - 10.5 K/uL   RBC 4.65  3.87 - 5.11 MIL/uL   Hemoglobin 12.7  12.0 - 15.0 g/dL   HCT 16.1  09.6 - 04.5 %   MCV 83.2  78.0 - 100.0 fL   MCH 27.3  26.0 - 34.0 pg   MCHC 32.8  30.0 - 36.0 g/dL   RDW 40.9  81.1 - 91.4 %   Platelets 226  150 - 400 K/uL   Neutrophils Relative % 77  43 - 77 %   Neutro Abs 6.8  1.7 - 7.7 K/uL   Lymphocytes Relative 16  12 - 46 %   Lymphs Abs 1.5  0.7 - 4.0 K/uL   Monocytes Relative 7  3 - 12 %   Monocytes Absolute 0.6  0.1 - 1.0 K/uL   Eosinophils Relative 0  0 - 5 %   Eosinophils Absolute 0.0  0.0 - 0.7 K/uL   Basophils Relative 0  0 - 1 %   Basophils Absolute 0.0  0.0 - 0.1 K/uL  COMPREHENSIVE METABOLIC PANEL     Status: Abnormal   Collection Time    10/30/13  1:54 AM      Result Value Ref Range   Sodium 143  137 - 147 mEq/L   Potassium 3.4 (*) 3.7 - 5.3 mEq/L   Chloride 102  96 - 112 mEq/L   CO2 26  19 - 32 mEq/L   Glucose, Bld 165 (*) 70 - 99 mg/dL   BUN 15  6 - 23 mg/dL   Creatinine, Ser 7.82  0.50 - 1.10 mg/dL   Calcium 9.8  8.4 - 95.6 mg/dL   Total Protein 7.7  6.0 - 8.3 g/dL   Albumin 3.5  3.5 - 5.2 g/dL   AST 20  0 - 37 U/L   ALT 12  0 - 35 U/L   Alkaline Phosphatase 76  39 - 117 U/L   Total Bilirubin 0.5  0.3 - 1.2 mg/dL   GFR calc non Af Amer 54 (*) >90 mL/min   GFR calc Af Amer 62 (*) >90 mL/min   Anion gap 15  5 - 15   2:34 AM Patient feeling better after IV fluids and Zofran. Urinalysis is concerning for early UTI. We will treat accordingly.   Hanley Seamen, MD 10/30/13 [phone]

## 2013-11-01 LAB — URINE CULTURE: Colony Count: >=100000

## 2013-11-02 ENCOUNTER — Telehealth (HOSPITAL_BASED_OUTPATIENT_CLINIC_OR_DEPARTMENT_OTHER): Payer: Self-pay | Admitting: Emergency Medicine

## 2013-11-02 NOTE — Telephone Encounter (Signed)
Post ED Visit - Positive Culture Follow-up  Culture report reviewed by antimicrobial stewardship pharmacist: []  Wes Dulaney, Pharm.D., BCPS [x]  Celedonio MiyamotoJeremy Frens, [address] BCPS []  Georgina PillionElizabeth Martin, Pharm.D., BCPS []  GroverMinh Pham, [place]arm[place]D., BCPS, AAHIVP []  Estella HuskMichelle Turner, Pharm.D., BCPS, AAHIVP []  Carly Sabat, Pharm.D. []  Enzo BiNathan Batchelder, [address]  Positive urine culture Klebsiella Treated with Cephalexin 500mg  po caps bid x 5 days, organism sensitive to the same and no further patient follow-up is required at this time.  Berle MullMiller, Tiffanny Lamarche 11/02/2013, 11:00 AM

## 2013-12-03 ENCOUNTER — Encounter (HOSPITAL_BASED_OUTPATIENT_CLINIC_OR_DEPARTMENT_OTHER): Payer: Self-pay | Admitting: *Deleted

## 2013-12-03 ENCOUNTER — Emergency Department (HOSPITAL_BASED_OUTPATIENT_CLINIC_OR_DEPARTMENT_OTHER)
Admission: EM | Admit: 2013-12-03 | Discharge: 2013-12-04 | Disposition: A | Discharge status: Home / Self Care | Payer: Medicare Other | Attending: Emergency Medicine | Admitting: Emergency Medicine

## 2013-12-03 DIAGNOSIS — R52 Pain, unspecified: Secondary | ICD-10-CM

## 2013-12-03 DIAGNOSIS — Z7982 Long term (current) use of aspirin: Secondary | ICD-10-CM | POA: Insufficient documentation

## 2013-12-03 DIAGNOSIS — J45909 Unspecified asthma, uncomplicated: Secondary | ICD-10-CM | POA: Diagnosis not present

## 2013-12-03 DIAGNOSIS — R1084 Generalized abdominal pain: Secondary | ICD-10-CM | POA: Diagnosis present

## 2013-12-03 DIAGNOSIS — E079 Disorder of thyroid, unspecified: Secondary | ICD-10-CM | POA: Diagnosis not present

## 2013-12-03 DIAGNOSIS — K5900 Constipation, unspecified: Secondary | ICD-10-CM | POA: Insufficient documentation

## 2013-12-03 DIAGNOSIS — K219 Gastro-esophageal reflux disease without esophagitis: Secondary | ICD-10-CM | POA: Diagnosis not present

## 2013-12-03 DIAGNOSIS — I10 Essential (primary) hypertension: Secondary | ICD-10-CM | POA: Insufficient documentation

## 2013-12-03 DIAGNOSIS — Z79899 Other long term (current) drug therapy: Secondary | ICD-10-CM | POA: Insufficient documentation

## 2013-12-03 LAB — CBC WITH DIFFERENTIAL/PLATELET
BASOS PCT: 1 % (ref 0–1)
Basophils Absolute: 0 10*3/uL (ref 0.0–0.1)
Eosinophils Absolute: 0 10*3/uL (ref 0.0–0.7)
Eosinophils Relative: 1 % (ref 0–5)
HCT: 35.6 % — ABNORMAL LOW (ref 36.0–46.0)
Hemoglobin: 11.9 g/dL — ABNORMAL LOW (ref 12.0–15.0)
LYMPHS PCT: 28 % (ref 12–46)
Lymphs Abs: 1.4 10*3/uL (ref 0.7–4.0)
MCH: 27.7 pg (ref 26.0–34.0)
MCHC: 33.4 g/dL (ref 30.0–36.0)
MCV: 82.8 fL (ref 78.0–100.0)
Monocytes Absolute: 0.8 10*3/uL (ref 0.1–1.0)
Monocytes Relative: 17 % — ABNORMAL HIGH (ref 3–12)
Neutro Abs: 2.7 10*3/uL (ref 1.7–7.7)
Neutrophils Relative %: 53 % (ref 43–77)
Platelets: 200 10*3/uL (ref 150–400)
RBC: 4.3 MIL/uL (ref 3.87–5.11)
RDW: 14 % (ref 11.5–15.5)
WBC: 5 10*3/uL (ref 4.0–10.5)

## 2013-12-03 LAB — COMPREHENSIVE METABOLIC PANEL
ALT: 14 U/L (ref 0–35)
AST: 25 U/L (ref 0–37)
Albumin: 3.7 g/dL (ref 3.5–5.2)
Alkaline Phosphatase: 73 U/L (ref 39–117)
Anion gap: 13 (ref 5–15)
BUN: 13 mg/dL (ref 6–23)
CHLORIDE: 102 meq/L (ref 96–112)
CO2: 28 meq/L (ref 19–32)
CREATININE: 0.9 mg/dL (ref 0.50–1.10)
Calcium: 9.8 mg/dL (ref 8.4–10.5)
GFR, EST AFRICAN AMERICAN: 71 mL/min — AB (ref >90)
GFR, EST NON AFRICAN AMERICAN: 61 mL/min — AB (ref >90)
GLUCOSE: 115 mg/dL — AB (ref 70–99)
Potassium: 3.2 mEq/L — ABNORMAL LOW (ref 3.7–5.3)
Sodium: 143 mEq/L (ref 137–147)
Total Bilirubin: 0.6 mg/dL (ref 0.3–1.2)
Total Protein: 7.8 g/dL (ref 6.0–8.3)

## 2013-12-03 LAB — URINALYSIS, ROUTINE W REFLEX MICROSCOPIC
BILIRUBIN URINE: NEGATIVE
GLUCOSE, UA: NEGATIVE mg/dL
Hgb urine dipstick: NEGATIVE
KETONES UR: NEGATIVE mg/dL
LEUKOCYTES UA: NEGATIVE
Nitrite: NEGATIVE
Protein, ur: NEGATIVE mg/dL
Specific Gravity, Urine: 1.009 (ref 1.005–1.030)
Urobilinogen, UA: 1 mg/dL (ref 0.0–1.0)
pH: 6.5 (ref 5.0–8.0)

## 2013-12-03 LAB — LIPASE, BLOOD: LIPASE: 45 U/L (ref 11–59)

## 2013-12-03 MED ORDER — GI COCKTAIL ~~LOC~~
30.0000 mL | Freq: Once | ORAL | Status: AC
  Administered 2013-12-03: 30 mL via ORAL
  Filled 2013-12-03: qty 30

## 2013-12-03 MED ORDER — DICYCLOMINE HCL 10 MG/ML IM SOLN
20.0000 mg | Freq: Once | INTRAMUSCULAR | Status: AC
  Administered 2013-12-03: 20 mg via INTRAMUSCULAR
  Filled 2013-12-03: qty 2

## 2013-12-03 NOTE — ED Notes (Addendum)
Mid abdominal burning pain off and on for 2 days. Constipation. Recently treated for a UTI

## 2013-12-03 NOTE — ED Provider Notes (Signed)
CSN: [id number]     Arrival date & time 12/03/13  2102 History  This chart was scribed for Dominique Ochoa Dominique CordsK Cathey Fredenburg-Rasch, MD by Modena JanskyAlbert Thayil, ED Scribe. This patient was seen in room MH04/MH04 and the patient's care was started at 11:04 PM.   Chief Complaint  Patient presents with  . Abdominal Pain   Patient is a 73 y.o. female presenting with abdominal pain. The history is provided by the patient. No language interpreter was used.  Abdominal Pain Pain location:  Generalized Pain quality: burning   Pain radiates to:  Does not radiate Pain severity:  Moderate Duration:  4 days Timing:  Intermittent Chronicity:  New Context: eating   Context: not alcohol use   Relieved by: gaviscon. Worsened by:  Nothing tried Ineffective treatments:  None tried Associated symptoms: constipation   Associated symptoms: no chest pain, no diarrhea, no dysuria, no fever, no nausea, no shortness of breath and no vomiting   Risk factors: not pregnant and no recent hospitalization    HPI Comments: Dominique Ochoa is a 73 y.o. female with a hx of GERD who presents to the Emergency Department complaining of intermittent moderate generalized abdominal pain that started about 3 days ago. She reports that she had an episode 3 days ago and this morning. She rates the severity of her pain currently as a 3/10. She describes the pain as a burning sensation. She states that she took gaviscon with relief. She reports that her last BM was 2 days ago. She denies any diarrhea, or emesis.   Past Medical History  Diagnosis Date  . Thyroid disease   . Hypertension   . GERD (gastroesophageal reflux disease)   . Asthma    History reviewed. No pertinent past surgical history. No family history on file. History  Substance Use Topics  . Smoking status: Never Smoker   . Smokeless tobacco: Not on file  . Alcohol Use: No   OB History    No data available     Review of Systems  Constitutional: Negative for fever.  Respiratory:  Negative for shortness of breath.   Cardiovascular: Negative for chest pain.  Gastrointestinal: Positive for abdominal pain and constipation. Negative for nausea, vomiting and diarrhea.  Genitourinary: Negative for dysuria and difficulty urinating.  All other systems reviewed and are negative.   Allergies  Fish oil  Home Medications   Prior to Admission medications   Medication Sig Start Date End Date Taking? Authorizing Provider  aspirin 81 MG tablet Take 160 mg by mouth daily.    Historical Provider, MD  cephALEXin (KEFLEX) 500 MG capsule Take 1 capsule (500 mg total) by mouth 2 (two) times daily. 10/30/13   John L Molpus, MD  esomeprazole (NEXIUM) 20 MG capsule Take 40 mg by mouth daily before breakfast.     Historical Provider, MD  furosemide (LASIX) 20 MG tablet Take 20 mg by mouth daily.    Historical Provider, MD  levothyroxine (SYNTHROID, LEVOTHROID) 100 MCG tablet Take 250 mcg by mouth daily.     Historical Provider, MD  lisinopril (PRINIVIL,ZESTRIL) 10 MG tablet Take 10 mg by mouth daily.    Historical Provider, MD  montelukast (SINGULAIR) 10 MG tablet Take 10 mg by mouth at bedtime.    Historical Provider, MD  ondansetron (ZOFRAN ODT) 4 MG disintegrating tablet Take 1 tablet (4 mg total) by mouth every 8 (eight) hours as needed for nausea or vomiting. 4mg  ODT q4 hours prn nausea/vomit 10/30/13   Hanley SeamenJohn L Molpus, MD  BP 147/78 mmHg  Pulse 81  Temp(Src) 97.6 F (36.4 C) (Oral)  Resp 20  Ht 5\' 6"  (1.676 m)  Wt 163 lb (73.936 kg)  BMI 26.32 kg/m2  SpO2 100% Physical Exam  Constitutional: She is oriented to person, place, and time. She appears well-developed and well-nourished. No distress.  HENT:  Head: Normocephalic and atraumatic.  Mouth/Throat: Oropharynx is clear and moist.  Moist mucous membranes.   Eyes: EOM are normal. Pupils are equal, round, and reactive to light.  Neck: Normal range of motion. Neck supple. No tracheal deviation present.  Cardiovascular: Normal  rate, regular rhythm and normal heart sounds.  Exam reveals no gallop and no friction rub.   No murmur heard. Pulmonary/Chest: Effort normal and breath sounds normal. No respiratory distress. She has no wheezes. She has no rales.  Abdominal: Soft. She exhibits no distension. There is no tenderness. There is no rebound and no guarding.  Hyperactive bowel sounds. Stool is palpable.   Musculoskeletal: Normal range of motion. She exhibits no edema or tenderness.  Neurological: She is alert and oriented to person, place, and time.  Skin: Skin is warm and dry.  Psychiatric: She has a normal mood and affect. Her behavior is normal.  Nursing note and vitals reviewed.   ED Course  Procedures (including critical care time) DIAGNOSTIC STUDIES: Oxygen Saturation is 100% on RA, normal by my interpretation.    COORDINATION OF CARE: 11:08 PM- Pt advised of plan for treatment which includes medication and labs and pt agrees.  Labs Review Labs Reviewed  URINALYSIS, ROUTINE W REFLEX MICROSCOPIC - Abnormal; Notable for the following:    APPearance CLOUDY (*)    All other components within normal limits  CBC WITH DIFFERENTIAL - Abnormal; Notable for the following:    Hemoglobin 11.9 (*)    HCT 35.6 (*)    Monocytes Relative 17 (*)    All other components within normal limits  COMPREHENSIVE METABOLIC PANEL - Abnormal; Notable for the following:    Potassium 3.2 (*)    Glucose, Bld 115 (*)    GFR calc non Af Amer 61 (*)    GFR calc Af Amer 71 (*)    All other components within normal limits  LIPASE, BLOOD    Imaging Review No results found.   EKG Interpretation None      MDM   Final diagnoses:  Pain   Exam and vitals benign and reassuring.  Patient points to sites where bowel sounds are most active as sites of pain.  Suspect Gas and GERD.  Will recommend a bland diet and miralax.  Strict return precautions given.     I personally performed the services described in this  documentation, which was scribed in my presence. The recorded information has been reviewed and is accurate.     [name], MD 12/04/13 [phone]

## 2013-12-04 ENCOUNTER — Emergency Department (HOSPITAL_BASED_OUTPATIENT_CLINIC_OR_DEPARTMENT_OTHER): Payer: Medicare Other

## 2013-12-04 MED ORDER — SUCRALFATE 1 GM/10ML PO SUSP: 1.0000 g | Freq: Three times a day (TID) | ORAL | Status: DC

## 2014-05-25 ENCOUNTER — Emergency Department (HOSPITAL_BASED_OUTPATIENT_CLINIC_OR_DEPARTMENT_OTHER)
Admission: EM | Admit: 2014-05-25 | Discharge: 2014-05-25 | Disposition: A | Discharge status: Home / Self Care | Payer: Medicare Other | Attending: Emergency Medicine | Admitting: Emergency Medicine

## 2014-05-25 ENCOUNTER — Encounter (HOSPITAL_BASED_OUTPATIENT_CLINIC_OR_DEPARTMENT_OTHER): Payer: Self-pay | Admitting: *Deleted

## 2014-05-25 DIAGNOSIS — Y998 Other external cause status: Secondary | ICD-10-CM | POA: Diagnosis not present

## 2014-05-25 DIAGNOSIS — S91011A Laceration without foreign body, right ankle, initial encounter: Secondary | ICD-10-CM | POA: Diagnosis present

## 2014-05-25 DIAGNOSIS — Y9389 Activity, other specified: Secondary | ICD-10-CM | POA: Insufficient documentation

## 2014-05-25 DIAGNOSIS — Z79899 Other long term (current) drug therapy: Secondary | ICD-10-CM | POA: Diagnosis not present

## 2014-05-25 DIAGNOSIS — Z7982 Long term (current) use of aspirin: Secondary | ICD-10-CM | POA: Diagnosis not present

## 2014-05-25 DIAGNOSIS — J45909 Unspecified asthma, uncomplicated: Secondary | ICD-10-CM | POA: Diagnosis not present

## 2014-05-25 DIAGNOSIS — I10 Essential (primary) hypertension: Secondary | ICD-10-CM | POA: Insufficient documentation

## 2014-05-25 DIAGNOSIS — W25XXXA Contact with sharp glass, initial encounter: Secondary | ICD-10-CM | POA: Diagnosis not present

## 2014-05-25 DIAGNOSIS — E079 Disorder of thyroid, unspecified: Secondary | ICD-10-CM | POA: Diagnosis not present

## 2014-05-25 DIAGNOSIS — K219 Gastro-esophageal reflux disease without esophagitis: Secondary | ICD-10-CM | POA: Diagnosis not present

## 2014-05-25 DIAGNOSIS — Z792 Long term (current) use of antibiotics: Secondary | ICD-10-CM | POA: Insufficient documentation

## 2014-05-25 DIAGNOSIS — Y9289 Other specified places as the place of occurrence of the external cause: Secondary | ICD-10-CM | POA: Diagnosis not present

## 2014-05-25 NOTE — ED Notes (Signed)
Pt c/o laceration to right ankle  by broken glass x 30 mins ago

## 2014-05-25 NOTE — ED Notes (Signed)
Minor cut to rt ankle by broken bottle that fell from cabinet  Bleeding controlled

## 2014-05-25 NOTE — Discharge Instructions (Signed)

## 2014-05-25 NOTE — ED Provider Notes (Signed)
CSN: [id number]     Arrival date & time 05/25/14  1854 History   First MD Initiated Contact with Patient 05/25/14 1907     Chief Complaint  Patient presents with  . Extremity Laceration     (Consider location/radiation/quality/duration/timing/severity/associated sxs/prior Treatment) HPI Comments: 74 year old female presenting with a laceration to her right ankle occurring 30 minutes prior to arrival. States she opened a cabinet, and a hot sauce bottle fell out that broke, scraping her on the right side of her ankle. States there was not a lot of bleeding. Denies any pain. She applied a Band-Aid but did not closely look at the wound. Denies numbness or tingling.  Patient is a 74 y.o. female presenting with skin laceration. The history is provided by the patient.  Laceration Location:  Leg Leg laceration location:  R ankle Length (cm):  2 Depth:  Cutaneous Quality: straight   Bleeding: controlled   Time since incident:  30 minutes Laceration mechanism:  Broken glass   Past Medical History  Diagnosis Date  . Thyroid disease   . Hypertension   . GERD (gastroesophageal reflux disease)   . Asthma    History reviewed. No pertinent past surgical history. History reviewed. No pertinent family history. History  Substance Use Topics  . Smoking status: Never Smoker   . Smokeless tobacco: Not on file  . Alcohol Use: No   OB History    No data available     Review of Systems  Constitutional: Negative.   HENT: Negative.   Respiratory: Negative.   Cardiovascular: Negative.   Skin: Positive for wound.  Neurological: Negative for numbness.      Allergies  Fish oil  Home Medications   Prior to Admission medications   Medication Sig Start Date End Date Taking? Authorizing Provider  aspirin 81 MG tablet Take 160 mg by mouth daily.    Historical Provider, MD  cephALEXin (KEFLEX) 500 MG capsule Take 1 capsule (500 mg total) by mouth 2 (two) times daily. 10/30/13   John Molpus,  MD  esomeprazole (NEXIUM) 20 MG capsule Take 40 mg by mouth daily before breakfast.     Historical Provider, MD  furosemide (LASIX) 20 MG tablet Take 20 mg by mouth daily.    Historical Provider, MD  levothyroxine (SYNTHROID, LEVOTHROID) 100 MCG tablet Take 250 mcg by mouth daily.     Historical Provider, MD  lisinopril (PRINIVIL,ZESTRIL) 10 MG tablet Take 10 mg by mouth daily.    Historical Provider, MD  montelukast (SINGULAIR) 10 MG tablet Take 10 mg by mouth at bedtime.    Historical Provider, MD  ondansetron (ZOFRAN ODT) 4 MG disintegrating tablet Take 1 tablet (4 mg total) by mouth every 8 (eight) hours as needed for nausea or vomiting.  ODT q4 hours prn nausea/vomit 10/30/13   John Molpus, MD  sucralfate (CARAFATE) 1 GM/10ML suspension Take 10 mLs (1 g total) by mouth 4 (four) times daily -  with meals and at bedtime. 12/04/13   April Palumbo, MD   BP 143/66 mmHg  Pulse 81  Temp(Src) 98.2 F (36.8 C) (Oral)  Resp 18  Ht  (1.626 m)  Wt 153 lb (69.4 kg)  BMI 26.25 kg/m2  SpO2 99% Physical Exam  Constitutional: She is oriented to person, place, and time. She appears well-developed and well-nourished. No distress.  HENT:  Head: Normocephalic and atraumatic.  Mouth/Throat: Oropharynx is clear and moist.  Eyes: Conjunctivae and EOM are normal.  Neck: Normal range of motion. Neck supple.  Cardiovascular: Normal rate, regular rhythm and normal heart sounds.   Pulses:      Dorsalis pedis pulses are 2+ on the left side.       Posterior tibial pulses are 2+ on the left side.  Pulmonary/Chest: Effort normal and breath sounds normal. No respiratory distress.  Musculoskeletal: Normal range of motion. She exhibits no edema.       Feet:  Neurological: She is alert and oriented to person, place, and time. No sensory deficit.  Skin: Skin is warm and dry.  Psychiatric: She has a normal mood and affect. Her behavior is normal.  Nursing note and vitals reviewed.   ED Course  Procedures  (including critical care time) Labs Review Labs Reviewed - No data to display  Imaging Review No results found.   EKG Interpretation None      MDM   Final diagnoses:  Laceration of right ankle, initial encounter   Neurovascularly intact. The laceration is very superficial. No foreign body. No active bleeding. Reassurance given. Stable for discharge. Return precautions given. Patient states understanding of treatment care plan and is agreeable.  Discussed with attending Dr. Anitra LauthPlunkett who also evaluated patient and agrees with plan of care.  Kathrynn SpeedRobyn M Kmarion Rawl, PA-C 05/25/14 1932  Gwyneth SproutWhitney Plunkett, MD 05/25/14 [phone]

## 2015-04-15 ENCOUNTER — Emergency Department (HOSPITAL_COMMUNITY): Payer: Medicare Other

## 2015-04-15 ENCOUNTER — Emergency Department (HOSPITAL_COMMUNITY)
Admission: EM | Admit: 2015-04-15 | Discharge: 2015-04-15 | Disposition: A | Discharge status: Home / Self Care | Payer: Medicare Other | Attending: Emergency Medicine | Admitting: Emergency Medicine

## 2015-04-15 ENCOUNTER — Encounter (HOSPITAL_COMMUNITY): Payer: Self-pay

## 2015-04-15 DIAGNOSIS — Z7982 Long term (current) use of aspirin: Secondary | ICD-10-CM | POA: Diagnosis not present

## 2015-04-15 DIAGNOSIS — I10 Essential (primary) hypertension: Secondary | ICD-10-CM | POA: Diagnosis not present

## 2015-04-15 DIAGNOSIS — E079 Disorder of thyroid, unspecified: Secondary | ICD-10-CM | POA: Insufficient documentation

## 2015-04-15 DIAGNOSIS — Z79899 Other long term (current) drug therapy: Secondary | ICD-10-CM | POA: Insufficient documentation

## 2015-04-15 DIAGNOSIS — K219 Gastro-esophageal reflux disease without esophagitis: Secondary | ICD-10-CM | POA: Diagnosis not present

## 2015-04-15 DIAGNOSIS — J45909 Unspecified asthma, uncomplicated: Secondary | ICD-10-CM | POA: Insufficient documentation

## 2015-04-15 DIAGNOSIS — R55 Syncope and collapse: Secondary | ICD-10-CM | POA: Insufficient documentation

## 2015-04-15 LAB — CBC WITH DIFFERENTIAL/PLATELET
Basophils Absolute: 0 10*3/uL (ref 0.0–0.1)
Basophils Relative: 0 %
Eosinophils Absolute: 0 10*3/uL (ref 0.0–0.7)
Eosinophils Relative: 0 %
HCT: 37.6 % (ref 36.0–46.0)
HEMOGLOBIN: 11.9 g/dL — AB (ref 12.0–15.0)
LYMPHS PCT: 15 %
Lymphs Abs: 1.1 10*3/uL (ref 0.7–4.0)
MCH: 26.5 pg (ref 26.0–34.0)
MCHC: 31.6 g/dL (ref 30.0–36.0)
MCV: 83.7 fL (ref 78.0–100.0)
Monocytes Absolute: 0.6 10*3/uL (ref 0.1–1.0)
Monocytes Relative: 8 %
NEUTROS PCT: 77 %
Neutro Abs: 5.4 10*3/uL (ref 1.7–7.7)
Platelets: 221 10*3/uL (ref 150–400)
RBC: 4.49 MIL/uL (ref 3.87–5.11)
RDW: 14.1 % (ref 11.5–15.5)
WBC: 7.1 10*3/uL (ref 4.0–10.5)

## 2015-04-15 LAB — COMPREHENSIVE METABOLIC PANEL
ALK PHOS: 64 U/L (ref 38–126)
ALT: 11 U/L — AB (ref 14–54)
AST: 26 U/L (ref 15–41)
Albumin: 3.4 g/dL — ABNORMAL LOW (ref 3.5–5.0)
Anion gap: 13 (ref 5–15)
BUN: 19 mg/dL (ref 6–20)
CO2: 24 mmol/L (ref 22–32)
CREATININE: 1.13 mg/dL — AB (ref 0.44–1.00)
Calcium: 9.4 mg/dL (ref 8.9–10.3)
Chloride: 103 mmol/L (ref 101–111)
GFR calc Af Amer: 53 mL/min — ABNORMAL LOW (ref >60)
GFR, EST NON AFRICAN AMERICAN: 46 mL/min — AB (ref >60)
Glucose, Bld: 149 mg/dL — ABNORMAL HIGH (ref 65–99)
Potassium: 3.7 mmol/L (ref 3.5–5.1)
SODIUM: 140 mmol/L (ref 135–145)
Total Bilirubin: 0.7 mg/dL (ref 0.3–1.2)
Total Protein: 6.5 g/dL (ref 6.5–8.1)

## 2015-04-15 LAB — URINALYSIS, ROUTINE W REFLEX MICROSCOPIC
BILIRUBIN URINE: NEGATIVE
Glucose, UA: NEGATIVE mg/dL
Hgb urine dipstick: NEGATIVE
KETONES UR: NEGATIVE mg/dL
LEUKOCYTES UA: NEGATIVE
NITRITE: NEGATIVE
PH: 6.5 (ref 5.0–8.0)
PROTEIN: NEGATIVE mg/dL
Specific Gravity, Urine: 1.006 (ref 1.005–1.030)

## 2015-04-15 LAB — LIPASE, BLOOD: Lipase: 42 U/L (ref 11–51)

## 2015-04-15 LAB — TROPONIN I

## 2015-04-15 MED ORDER — ONDANSETRON HCL 4 MG/2ML IJ SOLN
4.0000 mg | Freq: Once | INTRAMUSCULAR | Status: DC
  Filled 2015-04-15: qty 2

## 2015-04-15 MED ORDER — SODIUM CHLORIDE 0.9 % IV BOLUS (SEPSIS)
500.0000 mL | Freq: Once | INTRAVENOUS | Status: AC
  Administered 2015-04-15: 500 mL via INTRAVENOUS

## 2015-04-15 NOTE — ED Notes (Signed)
Patient transported to CT 

## 2015-04-15 NOTE — ED Notes (Signed)
Patient arrived to ED via [facility] from home. EMS reports: patient lives with family. Patient reported that she awoke approx 0200 feeling nauseous. Ambulated to bathroom to vomit. Had unwitnessed syncopal episode. Reports that when she came to, she banged on wall to alert family. EMS called by family. 20 gauge in Left FA. BP 118/82, Pulse 80, 99% on room air. CBG 163. EKG - RBBB. Also, c/o abdominal pain with palpation. Hx GERD.

## 2015-04-15 NOTE — ED Provider Notes (Signed)
CSN: [id number]     Arrival date & time 04/15/15  0240 History  By signing my name below, I, [name], attest that this documentation has been prepared under the direction and in the presence of Gilda Creasehristopher J Pollina, MD. Electronically Signed: Evon Slackerrance Branch, ED Scribe. 04/15/2015. 3:21 AM.     Chief Complaint  Patient presents with  . Loss of Consciousness    The history is provided by the patient. No language interpreter was used.   HPI Comments: Dominique Ochoa is a 75 y.o. female brought in by ambulance, who presents to the Emergency Department complaining of syncopal episode 45 min PTA. Per ems pt was complaining of abdominal pain at about 9 pm tonight. She states that she woke up tonight due to nausea. She states she went to the restroom wanted to vomit but was unable to. States she returned to her room and is really unsure what happened after that. She states that all she remember is waking up on the floor. Pt states she felt "flush" prior to LOC. She states that she feels as if she is back to baseline. Denies HA, CP. Pt denies abdominal pain or nausea at this time.      Past Medical History  Diagnosis Date  . Thyroid disease   . Hypertension   . GERD (gastroesophageal reflux disease)   . Asthma    History reviewed. No pertinent past surgical history. No family history on file. Social History  Substance Use Topics  . Smoking status: Never Smoker   . Smokeless tobacco: None  . Alcohol Use: No   OB History    No data available     Review of Systems  Cardiovascular: Negative for chest pain.  Neurological: Positive for syncope. Negative for headaches.  All other systems reviewed and are negative.     Allergies  Fish oil  Home Medications   Prior to Admission medications   Medication Sig Start Date End Date Taking? Authorizing Provider  aspirin 81 MG tablet Take 81 mg by mouth every other day.    Yes Historical Provider, MD  levothyroxine (SYNTHROID,  LEVOTHROID) 25 MCG tablet Take 25 mcg by mouth daily before breakfast.   Yes Historical Provider, MD  lisinopril-hydrochlorothiazide (PRINZIDE,ZESTORETIC) 20-25 MG tablet Take 1 tablet by mouth daily.   Yes Historical Provider, MD  montelukast (SINGULAIR) 10 MG tablet Take 10 mg by mouth at bedtime.   Yes Historical Provider, MD  Multiple Vitamin (MULTIVITAMIN WITH MINERALS) TABS tablet Take 1 tablet by mouth daily.   Yes Historical Provider, MD  pantoprazole (PROTONIX) 40 MG tablet Take 40 mg by mouth 2 (two) times daily.   Yes Historical Provider, MD   BP 143/74 mmHg  Pulse 78  Temp(Src) 98.4 F (36.9 C) (Oral)  Resp 19  Ht 5\' 5"  (1.651 m)  Wt 165 lb (74.844 kg)  BMI 27.46 kg/m2  SpO2 93%   Physical Exam  Constitutional: She is oriented to person, place, and time. She appears well-developed and well-nourished. No distress.  HENT:  Head: Normocephalic and atraumatic.  Right Ear: Hearing normal.  Left Ear: Hearing normal.  Nose: Nose normal.  Mouth/Throat: Oropharynx is clear and moist and mucous membranes are normal.  Eyes: Conjunctivae and EOM are normal. Pupils are equal, round, and reactive to light.  Neck: Normal range of motion. Neck supple.  Cardiovascular: Regular rhythm, S1 normal and S2 normal.  Exam reveals no gallop and no friction rub.   No murmur heard. Pulmonary/Chest: Effort normal and  breath sounds normal. No respiratory distress. She exhibits no tenderness.  Abdominal: Soft. Normal appearance and bowel sounds are normal. There is no hepatosplenomegaly. There is no tenderness. There is no rebound, no guarding, no tenderness at McBurney's point and negative Murphy's sign. No hernia.  Musculoskeletal: Normal range of motion.  Neurological: She is alert and oriented to person, place, and time. She has normal strength. No cranial nerve deficit or sensory deficit. Coordination normal. GCS eye subscore is 4. GCS verbal subscore is 5. GCS motor subscore is 6.  Skin: Skin is  warm, dry and intact. No rash noted. No cyanosis.  Psychiatric: She has a normal mood and affect. Her speech is normal and behavior is normal. Thought content normal.  Nursing note and vitals reviewed.   ED Course  Procedures (including critical care time) DIAGNOSTIC STUDIES: Oxygen Saturation is 100% on RA, normal by my interpretation.    COORDINATION OF CARE: 3:21 AM-Discussed treatment plan with pt at bedside and pt agreed to plan.     Labs Review Labs Reviewed  CBC WITH DIFFERENTIAL/PLATELET - Abnormal; Notable for the following:    Hemoglobin 11.9 (*)    All other components within normal limits  COMPREHENSIVE METABOLIC PANEL - Abnormal; Notable for the following:    Glucose, Bld 149 (*)    Creatinine, Ser 1.13 (*)    Albumin 3.4 (*)    ALT 11 (*)    GFR calc non Af Amer 46 (*)    GFR calc Af Amer 53 (*)    All other components within normal limits  LIPASE, BLOOD  URINALYSIS, ROUTINE W REFLEX MICROSCOPIC (NOT AT [facility])  TROPONIN I    Imaging Review Ct Head Wo Contrast  04/15/2015  CLINICAL DATA:  Altered mental status EXAM: CT HEAD WITHOUT CONTRAST TECHNIQUE: Contiguous axial images were obtained from the base of the skull through the vertex without intravenous contrast. COMPARISON:  04/30/2013 FINDINGS: There is no intracranial hemorrhage, mass or evidence of acute infarction. There is no extra-axial fluid collection. Gray matter and white matter appear normal. Cerebral volume is normal for age. Brainstem and posterior fossa are unremarkable. The CSF spaces appear normal. The bony structures are intact. The visible portions of the paranasal sinuses are clear. IMPRESSION: Mild generalized volume loss typical for age. Otherwise unremarkable. No acute intracranial findings. Electronically Signed   By: Ellery Plunk M.D.   On: 04/15/2015 03:43   Dg Abd Acute W/chest  04/15/2015  CLINICAL DATA:  Abdominal pain and vomiting, onset tonight. EXAM: DG ABDOMEN ACUTE W/ 1V CHEST  COMPARISON:  12/04/2013 FINDINGS: The abdominal gas pattern is negative for obstruction or perforation. There is a mildly generous volume of air throughout nonobstructed small bowel. There is stool and air throughout the colon. There is no extraluminal air. Multiple abdominal calcifications are present, likely outside the urinary tract. The upright view of the chest is negative for acute cardiopulmonary abnormality. There is mild unchanged linear left base scarring. IMPRESSION: Negative abdominal radiographs.  No acute cardiopulmonary disease. Electronically Signed   By: Ellery Plunk M.D.   On: 04/15/2015 03:51      EKG Interpretation   Date/Time:  Saturday April 15 2015 03:06:45 EDT Ventricular Rate:  78 PR Interval:  159 QRS Duration: 146 QT Interval:  421 QTC Calculation: 480 R Axis:   -67 Text Interpretation:  Sinus rhythm Consider left atrial enlargement RBBB  and LAFB No significant change since last tracing Confirmed by POLLINA   MD, CHRISTOPHER [phone]) on 04/15/2015 3:20:37  AM      MDM   Final diagnoses:  None  Syncope  Patient brought to the ER by ambulance after a brief syncopal episode. Patient reports that she was not feeling well earlier tonight. She had nausea and felt like she needed to vomit. She went to the bathroom and attempted to vomit, but was unable to. After retching she briefly passed out. She did fall to the ground but there was no injury. Syncope is likely secondary to vasovagal episode from the nausea and vomiting. Her nausea and vomiting has resolved and she is back to her normal self. She has not had any further episodes and continues to do well here in the ER. Vital signs are normal.  EKG unchanged from previous. No prolonged QT. Troponin negative. All labs are unremarkable, urinalysis does not suggest infection. CT head performed, no acute abnormality noted. S[place]ord Sisco syncope will negative, does not require any further workup.   I personally  performed the services described in this documentation, which was scribed in my presence. The recorded information has been reviewed and is accurate.      Gilda Crease, MD 04/15/15 [id number]

## 2015-04-15 NOTE — Discharge Instructions (Signed)

## 2015-09-16 ENCOUNTER — Observation Stay (HOSPITAL_BASED_OUTPATIENT_CLINIC_OR_DEPARTMENT_OTHER)
Admission: EM | Admit: 2015-09-16 | Discharge: 2015-09-17 | Disposition: A | Discharge status: Home / Self Care | Payer: Medicare Other | Attending: Internal Medicine | Admitting: Internal Medicine

## 2015-09-16 ENCOUNTER — Emergency Department (HOSPITAL_BASED_OUTPATIENT_CLINIC_OR_DEPARTMENT_OTHER): Payer: Medicare Other

## 2015-09-16 ENCOUNTER — Encounter (HOSPITAL_BASED_OUTPATIENT_CLINIC_OR_DEPARTMENT_OTHER): Payer: Self-pay | Admitting: Emergency Medicine

## 2015-09-16 DIAGNOSIS — J45909 Unspecified asthma, uncomplicated: Secondary | ICD-10-CM

## 2015-09-16 DIAGNOSIS — E039 Hypothyroidism, unspecified: Secondary | ICD-10-CM

## 2015-09-16 DIAGNOSIS — Z66 Do not resuscitate: Secondary | ICD-10-CM | POA: Insufficient documentation

## 2015-09-16 DIAGNOSIS — R739 Hyperglycemia, unspecified: Secondary | ICD-10-CM | POA: Diagnosis present

## 2015-09-16 DIAGNOSIS — R918 Other nonspecific abnormal finding of lung field: Secondary | ICD-10-CM | POA: Insufficient documentation

## 2015-09-16 DIAGNOSIS — R079 Chest pain, unspecified: Secondary | ICD-10-CM

## 2015-09-16 DIAGNOSIS — Z79899 Other long term (current) drug therapy: Secondary | ICD-10-CM | POA: Diagnosis not present

## 2015-09-16 DIAGNOSIS — R0789 Other chest pain: Principal | ICD-10-CM | POA: Insufficient documentation

## 2015-09-16 DIAGNOSIS — I10 Essential (primary) hypertension: Secondary | ICD-10-CM | POA: Diagnosis present

## 2015-09-16 DIAGNOSIS — K219 Gastro-esophageal reflux disease without esophagitis: Secondary | ICD-10-CM | POA: Diagnosis not present

## 2015-09-16 DIAGNOSIS — R9389 Abnormal findings on diagnostic imaging of other specified body structures: Secondary | ICD-10-CM | POA: Diagnosis present

## 2015-09-16 DIAGNOSIS — Z7982 Long term (current) use of aspirin: Secondary | ICD-10-CM | POA: Insufficient documentation

## 2015-09-16 HISTORY — DX: Hyperglycemia, unspecified: R73.9

## 2015-09-16 HISTORY — DX: Unspecified asthma, uncomplicated: J45.909

## 2015-09-16 HISTORY — DX: Essential (primary) hypertension: I10

## 2015-09-16 HISTORY — DX: Chest pain, unspecified: R07.9

## 2015-09-16 HISTORY — DX: Hypothyroidism, unspecified: E03.9

## 2015-09-16 HISTORY — DX: Abnormal findings on diagnostic imaging of other specified body structures: R93.89

## 2015-09-16 LAB — HEPATIC FUNCTION PANEL
ALK PHOS: 62 U/L (ref 38–126)
ALT: 14 U/L (ref 14–54)
AST: 25 U/L (ref 15–41)
Albumin: 3.5 g/dL (ref 3.5–5.0)
BILIRUBIN DIRECT: 0.1 mg/dL (ref 0.1–0.5)
BILIRUBIN INDIRECT: 0.4 mg/dL (ref 0.3–0.9)
TOTAL PROTEIN: 6.7 g/dL (ref 6.5–8.1)
Total Bilirubin: 0.5 mg/dL (ref 0.3–1.2)

## 2015-09-16 LAB — CBC WITH DIFFERENTIAL/PLATELET
BASOS ABS: 0 10*3/uL (ref 0.0–0.1)
Basophils Relative: 1 %
EOS PCT: 1 %
Eosinophils Absolute: 0.1 10*3/uL (ref 0.0–0.7)
HEMATOCRIT: 35.9 % — AB (ref 36.0–46.0)
Hemoglobin: 11.9 g/dL — ABNORMAL LOW (ref 12.0–15.0)
LYMPHS ABS: 1.5 10*3/uL (ref 0.7–4.0)
LYMPHS PCT: 23 %
MCH: 27.5 pg (ref 26.0–34.0)
MCHC: 33.1 g/dL (ref 30.0–36.0)
MCV: 82.9 fL (ref 78.0–100.0)
MONO ABS: 0.7 10*3/uL (ref 0.1–1.0)
Monocytes Relative: 11 %
NEUTROS ABS: 4.2 10*3/uL (ref 1.7–7.7)
Neutrophils Relative %: 64 %
Platelets: 256 10*3/uL (ref 150–400)
RBC: 4.33 MIL/uL (ref 3.87–5.11)
RDW: 15.4 % (ref 11.5–15.5)
WBC: 6.5 10*3/uL (ref 4.0–10.5)

## 2015-09-16 LAB — BASIC METABOLIC PANEL
Anion gap: 6 (ref 5–15)
BUN: 21 mg/dL — ABNORMAL HIGH (ref 6–20)
CALCIUM: 8.9 mg/dL (ref 8.9–10.3)
CHLORIDE: 104 mmol/L (ref 101–111)
CO2: 27 mmol/L (ref 22–32)
CREATININE: 1.21 mg/dL — AB (ref 0.44–1.00)
GFR calc Af Amer: 49 mL/min — ABNORMAL LOW (ref >60)
GFR calc non Af Amer: 42 mL/min — ABNORMAL LOW (ref >60)
GLUCOSE: 114 mg/dL — AB (ref 65–99)
Potassium: 3.7 mmol/L (ref 3.5–5.1)
Sodium: 137 mmol/L (ref 135–145)

## 2015-09-16 LAB — TROPONIN I
Troponin I: <0.03 ng/mL (ref <0.03)
Troponin I: <0.03 ng/mL (ref <0.03)
Troponin I: <0.03 ng/mL (ref <0.03)
Troponin I: <0.03 ng/mL (ref <0.03)

## 2015-09-16 MED ORDER — ASPIRIN EC 81 MG PO TBEC
81.0000 mg | DELAYED_RELEASE_TABLET | ORAL | Status: DC
  Administered 2015-09-16: 81 mg via ORAL
  Filled 2015-09-16: qty 1

## 2015-09-16 MED ORDER — ENOXAPARIN SODIUM 40 MG/0.4ML ~~LOC~~ SOLN
40.0000 mg | SUBCUTANEOUS | Status: DC
  Administered 2015-09-16 – 2015-09-17 (×2): 40 mg via SUBCUTANEOUS
  Filled 2015-09-16 (×2): qty 0.4

## 2015-09-16 MED ORDER — LISINOPRIL 20 MG PO TABS
20.0000 mg | ORAL_TABLET | Freq: Every day | ORAL | Status: DC
  Administered 2015-09-16 – 2015-09-17 (×2): 20 mg via ORAL
  Filled 2015-09-16 (×2): qty 1

## 2015-09-16 MED ORDER — ALBUTEROL SULFATE (2.5 MG/3ML) 0.083% IN NEBU: 2.5000 mg | INHALATION_SOLUTION | Freq: Four times a day (QID) | RESPIRATORY_TRACT | Status: DC | PRN

## 2015-09-16 MED ORDER — MONTELUKAST SODIUM 10 MG PO TABS
10.0000 mg | ORAL_TABLET | Freq: Every day | ORAL | Status: DC
  Administered 2015-09-16: 10 mg via ORAL
  Filled 2015-09-16: qty 1

## 2015-09-16 MED ORDER — ALBUTEROL SULFATE HFA 108 (90 BASE) MCG/ACT IN AERS: 1.0000 | INHALATION_SPRAY | Freq: Four times a day (QID) | RESPIRATORY_TRACT | Status: DC | PRN

## 2015-09-16 MED ORDER — ADULT MULTIVITAMIN W/MINERALS CH
1.0000 | ORAL_TABLET | Freq: Every day | ORAL | Status: DC
  Administered 2015-09-16 – 2015-09-17 (×2): 1 via ORAL
  Filled 2015-09-16 (×2): qty 1

## 2015-09-16 MED ORDER — POLYETHYLENE GLYCOL 3350 17 G PO PACK: 17.0000 g | PACK | Freq: Every day | ORAL | Status: DC

## 2015-09-16 MED ORDER — ACETAMINOPHEN 325 MG PO TABS: 650.0000 mg | ORAL_TABLET | ORAL | Status: DC | PRN

## 2015-09-16 MED ORDER — NITROGLYCERIN 0.4 MG SL SUBL: 0.4000 mg | SUBLINGUAL_TABLET | SUBLINGUAL | Status: DC | PRN

## 2015-09-16 MED ORDER — GI COCKTAIL ~~LOC~~: 30.0000 mL | Freq: Four times a day (QID) | ORAL | Status: DC | PRN

## 2015-09-16 MED ORDER — GI COCKTAIL ~~LOC~~
30.0000 mL | Freq: Once | ORAL | Status: AC
  Administered 2015-09-16: 30 mL via ORAL
  Filled 2015-09-16: qty 30

## 2015-09-16 MED ORDER — HYDROCHLOROTHIAZIDE 25 MG PO TABS
25.0000 mg | ORAL_TABLET | Freq: Every day | ORAL | Status: DC
  Administered 2015-09-16 – 2015-09-17 (×2): 25 mg via ORAL
  Filled 2015-09-16 (×2): qty 1

## 2015-09-16 MED ORDER — ONDANSETRON HCL 4 MG/2ML IJ SOLN: 4.0000 mg | Freq: Four times a day (QID) | INTRAMUSCULAR | Status: DC | PRN

## 2015-09-16 MED ORDER — PANTOPRAZOLE SODIUM 40 MG PO TBEC
40.0000 mg | DELAYED_RELEASE_TABLET | Freq: Two times a day (BID) | ORAL | Status: DC
  Administered 2015-09-16 – 2015-09-17 (×3): 40 mg via ORAL
  Filled 2015-09-16 (×3): qty 1

## 2015-09-16 MED ORDER — LISINOPRIL-HYDROCHLOROTHIAZIDE 20-25 MG PO TABS: 1.0000 | ORAL_TABLET | Freq: Every day | ORAL | Status: DC

## 2015-09-16 MED ORDER — DOCUSATE SODIUM 100 MG PO CAPS
100.0000 mg | ORAL_CAPSULE | Freq: Two times a day (BID) | ORAL | Status: DC
  Administered 2015-09-17: 100 mg via ORAL
  Filled 2015-09-16 (×2): qty 1

## 2015-09-16 MED ORDER — LEVOTHYROXINE SODIUM 25 MCG PO TABS
25.0000 ug | ORAL_TABLET | Freq: Every day | ORAL | Status: DC
  Administered 2015-09-17: 25 ug via ORAL
  Filled 2015-09-16 (×2): qty 1

## 2015-09-16 NOTE — ED Triage Notes (Signed)
Pt awoke from sleep with chest disciomfort lying in bed felt like she had to burp ginger ale ineffective pt attempted to ambulate and got dizzy

## 2015-09-16 NOTE — H&P (Signed)
History and Physical    Dominique Ochoa BMW:[id number] DOB: [birth year] DOA: 09/16/2015   PCP: Albertina Senegal, MD /UNASSIGNED  Patient coming from/Resides with: Private residence  Chief Complaint: Chest pain  HPI: Dominique Ochoa is a 75 y.o. female with medical history significant for asthma, hypothyroidism, GERD, hypertension who presented to [facility] after awakening to go to the bathroom and subsequently developing chest pressure. Patient reports she initially felt "gassy" and later developed what were similar to indigestion symptoms with chest pressure in the epigastric region radiating up into the throat. She attempted to burp without any improvement in her symptoms in the discomfort persisted. Patient presented to the above stated ER and was given a GI cocktail with resolution of her symptoms. She reports the symptoms are similar to previous reflux symptoms. She reports that her son came by and brought her a high fat content when these meal consisting of a loaded baked potato and some chicken nuggets. She is not had any exertional symptoms such as chest pain or shortness of breath during or after basic activity. She has not noticed any excessive fatigue or any decrease in exercise or activity tolerance. Initial EKG and troponin were reassuring. Because of a heart score 4 patient was sent to this facility for further evaluation for chest discomfort.  ED Course:  Vital signs: 97.8-125/68-66-14-room air 99% 2 view chest x-ray: Round opacity overlying the right lung base possibly a nipple shadow recommend repeat chest x-ray with nipple markers to exclude possibility of pulmonary nodule otherwise x-ray unremarkable Lab data: Sodium 137, potassium 3.7, CO2 27, BUN 21, creatinine 1.21, glucose 114, anion gap 6, troponin less than 0.03, WBC 6500 with normal differential, hemoglobin 11.9, platelets 256,000 Medications and treatments: GI cocktail 1  Review of Systems:  In addition to the HPI above,  No  Fever-chills, myalgias or other constitutional symptoms No Headache, changes with Vision or hearing, new weakness, tingling, numbness in any extremity, No problems swallowing food or Liquids No Cough or Shortness of Breath, palpitations, orthopnea or DOE No Abdominal pain, N/V; no melena or hematochezia, no dark tarry stools No dysuria, hematuria or flank pain No new skin rashes, lesions, masses or bruises, No new joints pains-aches No recent weight gain or loss No polyuria, polydypsia or polyphagia,   Past Medical History:  Diagnosis Date  . Asthma   . GERD (gastroesophageal reflux disease)   . Hypertension   . Thyroid disease     History reviewed. No pertinent surgical history.  Social History   Social History  . Marital status: Widowed    Spouse name: N/A  . Number of children: N/A  . Years of education: N/A   Occupational History  . Not on file.   Social History Main Topics  . Smoking status: Never Smoker  . Smokeless tobacco: Never Used  . Alcohol use No  . Drug use: No  . Sexual activity: Not on file   Other Topics Concern  . Not on file   Social History Narrative  . No narrative on file    Mobility: Without assistive devices Work history: Not obtained   Allergies  Allergen Reactions  . Fish Oil Other (See Comments)    MD orders    Family history reviewed and Patient denies any significant family history including of stroke or MI/CAD  Prior to Admission medications   Medication Sig Start Date End Date Taking? Authorizing Provider  aspirin 81 MG tablet Take 81 mg by mouth every other day.  Historical Provider, MD  levothyroxine (SYNTHROID, LEVOTHROID) 25 MCG tablet Take 25 mcg by mouth daily before breakfast.    Historical Provider, MD  lisinopril-hydrochlorothiazide (PRINZIDE,ZESTORETIC) 20-25 MG tablet Take 1 tablet by mouth daily.    Historical Provider, MD  montelukast (SINGULAIR) 10 MG tablet Take 10 mg by mouth at bedtime.    Historical  Provider, MD  Multiple Vitamin (MULTIVITAMIN WITH MINERALS) TABS tablet Take 1 tablet by mouth daily.    Historical Provider, MD  pantoprazole (PROTONIX) 40 MG tablet Take 40 mg by mouth 2 (two) times daily.    Historical Provider, MD    Physical Exam: Vitals:   09/16/15 0630 09/16/15 0719 09/16/15 0758 09/16/15 0853  BP: 138/74 133/55 137/62 133/62  Pulse: 66 73 71 81  Resp: 18 21 18 18   Temp:    98 F (36.7 C)  TempSrc:    Oral  SpO2: 100% 99% 100% 100%  Weight:    75.2 kg (165 lb 11.2 oz)  Height:    5\' 6"  (1.676 m)      Constitutional: NAD, calm, comfortable Eyes: PERRL, lids and conjunctivae normal ENMT: Mucous membranes are moist. Posterior pharynx clear of any exudate or lesions.Normal dentition.  Neck: normal, supple, no masses, no thyromegaly Respiratory: clear to auscultation bilaterally, no wheezing, no crackles. Normal respiratory effort. No accessory muscle use. Anterior chest wall nontender to palpation Cardiovascular: Regular rate and rhythm, no murmurs / rubs that there is an S4 gallop. Nonpitting bilateral lower extremity edema primarily in the ankles. 2+ pedal pulses. No carotid bruits.  Abdomen: no tenderness, no masses palpated. No hepatosplenomegaly. Bowel sounds positive.  Musculoskeletal: no clubbing / cyanosis. No joint deformity upper and lower extremities. Good ROM, no contractures. Normal muscle tone.  Skin: no rashes, lesions, ulcers. No induration Neurologic: CN 2-12 grossly intact. Sensation intact, DTR normal. Strength 5/5 x all 4 extremities.  Psychiatric: Normal judgment and insight. Alert and oriented x 3. Normal mood.    Labs on Admission: I have personally reviewed following labs and imaging studies  CBC:  Recent Labs Lab 09/16/15 0545  WBC 6.5  NEUTROABS 4.2  HGB 11.9*  HCT 35.9*  MCV 82.9  PLT 256   Basic Metabolic Panel:  Recent Labs Lab 09/16/15 0545  NA 137  K 3.7  CL 104  CO2 27  GLUCOSE 114*  BUN 21*  CREATININE  1.21*  CALCIUM 8.9   GFR: Estimated Creatinine Clearance: 40.4 mL/min (by C-G formula based on SCr of 1.21 mg/dL). Liver Function Tests: No results for input(s): AST, ALT, ALKPHOS, BILITOT, PROT, ALBUMIN in the last 168 hours. No results for input(s): LIPASE, AMYLASE in the last 168 hours. No results for input(s): AMMONIA in the last 168 hours. Coagulation Profile: No results for input(s): INR, PROTIME in the last 168 hours. Cardiac Enzymes:  Recent Labs Lab 09/16/15 0545  TROPONINI <0.03   BNP (last 3 results) No results for input(s): PROBNP in the last 8760 hours. HbA1C: No results for input(s): HGBA1C in the last 72 hours. CBG: No results for input(s): GLUCAP in the last 168 hours. Lipid Profile: No results for input(s): CHOL, HDL, LDLCALC, TRIG, CHOLHDL, LDLDIRECT in the last 72 hours. Thyroid Function Tests: No results for input(s): TSH, T4TOTAL, FREET4, T3FREE, THYROIDAB in the last 72 hours. Anemia Panel: No results for input(s): VITAMINB12, FOLATE, FERRITIN, TIBC, IRON, RETICCTPCT in the last 72 hours. Urine analysis:    Component Value Date/Time   COLORURINE YELLOW 04/15/2015 0425   APPEARANCEUR CLEAR 04/15/2015  0425   LABSPEC 1.006 04/15/2015 0425   PHURINE 6.5 04/15/2015 0425   GLUCOSEU NEGATIVE 04/15/2015 0425   HGBUR NEGATIVE 04/15/2015 0425   BILIRUBINUR NEGATIVE 04/15/2015 0425   KETONESUR NEGATIVE 04/15/2015 0425   PROTEINUR NEGATIVE 04/15/2015 0425   UROBILINOGEN 1.0 12/03/2013 2108   NITRITE NEGATIVE 04/15/2015 0425   LEUKOCYTESUR NEGATIVE 04/15/2015 0425   Sepsis Labs: @LABRCNTIP (procalcitonin:4,lacticidven:4) )No results found for this or any previous visit (from the past 240 hour(s)).   Radiological Exams on Admission: Dg Chest 2 View  Result Date: 09/16/2015 CLINICAL DATA:  Dizziness and chest discomfort. EXAM: CHEST  2 VIEW COMPARISON:  Chest radiograph 04/15/2015. FINDINGS: The lungs are well inflated. Cardiomediastinal contours are normal.  Round opacities overlying both lower lungs on the frontal projection are unchanged and likely represent nipple shadows. No focal airspace consolidation or pulmonary edema. IMPRESSION: 1. Round opacity overlying the right lung base, possibly a nipple shadow. Repeat chest radiograph with nipple markers is recommended for confirmation and to exclude the possibility of a pulmonary nodule. 2. No focal airspace consolidation or pulmonary edema. Electronically Signed   By: Deatra Robinson M.D.   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 as long as enzymes remain negative  -Fasting lipid panel in a.m. for risk factor stratification -Heart score equals 4 -prn sublingual NTG for chest pain -Continue aspirin 81 mg -No indication at this juncture to initiate beta blocker or full dose anticoagulation  Active Problems:   Abnormal finding on chest xray -Radiologist's question possible pulmonary nodule in right lung base and recommended repeating chest x-ray in a.m. with nipple markers-this has been ordered    HTN (hypertension) -Continue preadmission lisinopril with hydrochlorothiazide    GERD (gastroesophageal reflux disease) -Based on history patient's symptoms seem related to reflux -Continue  preadmission PPI -prn GI cocktail -Currently no abdominal pain but she still has gallbladder and nocturnal symptoms could be reflective of biliary colic so we'll check LFTs to better clarify    Hyperglycemia -Hemoglobin A1c    Hypothyroidism -Continue Synthroid    Asthma -Currently not actively wheezing -Continue Singulair -prn albuterol MDI      DVT prophylaxis: Lovenox Code Status: DO NOT RESUSCITATE  Family Communication: No family at bedside  Disposition Plan: Anticipate discharge back to preadmission home environment once medically stable Consults called: None  Admission status: Observation/telemetry    Isamar Wellbrock L. ANP-BC [facility] Pager [phone]   If 7PM-7AM, please contact night-coverage [email or web address] Password [email or web address]  09/16/2015, 11:10 AM

## 2015-09-16 NOTE — ED Provider Notes (Signed)
[facility] Provider Note   CSN: [id number] Arrival date & time: 09/16/15  0509     History   Chief Complaint Chief Complaint  Patient presents with  . Chest Pain    HPI Dominique Ochoa is a 75 y.o. female.  The history is provided by the patient.  Chest Pain   This is a new problem. The current episode started 3 to 5 hours ago. The problem occurs constantly. The problem has not changed since onset.The pain is associated with rest. The pain is present in the substernal region. The pain is moderate. The quality of the pain is described as dull. Associated symptoms include dizziness. Pertinent negatives include no diaphoresis, no palpitations and no weakness. She has tried nothing for the symptoms. The treatment provided no relief. Risk factors include obesity and post-menopausal.  Pertinent negatives for past medical history include no Marfan's syndrome.    Past Medical History:  Diagnosis Date  . Asthma   . GERD (gastroesophageal reflux disease)   . Hypertension   . Thyroid disease     There are no active problems to display for this patient.   History reviewed. No pertinent surgical history.  OB History    No data available       Home Medications    Prior to Admission medications   Medication Sig Start Date End Date Taking? Authorizing Provider  aspirin 81 MG tablet Take 81 mg by mouth every other day.     Historical Provider, MD  levothyroxine (SYNTHROID, LEVOTHROID) 25 MCG tablet Take 25 mcg by mouth daily before breakfast.    Historical Provider, MD  lisinopril-hydrochlorothiazide (PRINZIDE,ZESTORETIC) 20-25 MG tablet Take 1 tablet by mouth daily.    Historical Provider, MD  montelukast (SINGULAIR) 10 MG tablet Take 10 mg by mouth at bedtime.    Historical Provider, MD  Multiple Vitamin (MULTIVITAMIN WITH MINERALS) TABS tablet Take 1 tablet by mouth daily.    Historical Provider, MD  pantoprazole (PROTONIX) 40 MG tablet Take 40 mg by mouth 2 (two)  times daily.    Historical Provider, MD    Family History History reviewed. No pertinent family history.  Social History Social History  Substance Use Topics  . Smoking status: Never Smoker  . Smokeless tobacco: Never Used  . Alcohol use No     Allergies   Fish oil   Review of Systems Review of Systems  Constitutional: Negative for diaphoresis.  Cardiovascular: Negative for palpitations and leg swelling.  Neurological: Positive for dizziness. Negative for facial asymmetry, speech difficulty and weakness.  All other systems reviewed and are negative.    Physical Exam Updated Vital Signs BP 125/68 (BP Location: Right Arm)   Pulse 66   Temp 97.8 F (36.6 C) (Oral)   Resp 14   Ht 5\' 6"  (1.676 m)   Wt 168 lb (76.2 kg)   SpO2 99%   BMI 27.12 kg/m   Physical Exam  Constitutional: She is oriented to person, place, and time. She appears well-developed and well-nourished.  HENT:  Head: Normocephalic and atraumatic.  Mouth/Throat: No oropharyngeal exudate.  Eyes: EOM are normal. Pupils are equal, round, and reactive to light.  Neck: Normal range of motion. Neck supple. No JVD present.  Cardiovascular: Normal rate, regular rhythm and intact distal pulses.   Pulmonary/Chest: Effort normal and breath sounds normal. No stridor. No respiratory distress.  Abdominal: Soft. Bowel sounds are normal. She exhibits no mass. There is no tenderness. There is no guarding.  Musculoskeletal: Normal  range of motion.  Neurological: She is alert and oriented to person, place, and time. No cranial nerve deficit.  Skin: Skin is warm and dry. Capillary refill takes less than 2 seconds. She is not diaphoretic.  Psychiatric: She has a normal mood and affect.     ED Treatments / Results  Labs (all labs ordered are listed, but only abnormal results are displayed) Labs Reviewed  CBC WITH DIFFERENTIAL/PLATELET  BASIC METABOLIC PANEL  TROPONIN I    EKG  EKG  Interpretation  Date/Time:  Saturday September 16 2015 05:31:03 EDT Ventricular Rate:  69 PR Interval:    QRS Duration: 141 QT Interval:  454 QTC Calculation: 487 R Axis:   -63 Text Interpretation:  Sinus rhythm RBBB and LAFB Confirmed by [facility]  MD, Deaunna Olarte ([id number]) on 09/16/2015 5:33:56 AM      Vitals:   09/16/15 0536 09/16/15 0630  BP: 125/68 138/74  Pulse: 66 66  Resp: 14 18  Temp: 97.8 F (36.6 C)    Results for orders placed or performed during the hospital encounter of 09/16/15  CBC with Differential/Platelet  Result Value Ref Range   WBC 6.5 4.0 - 10.5 K/uL   RBC 4.33 3.87 - 5.11 MIL/uL   Hemoglobin 11.9 (L) 12.0 - 15.0 g/dL   HCT 44.035.9 (L) 10.236.0 - 72.546.0 %   MCV 82.9 78.0 - 100.0 fL   MCH 27.5 26.0 - 34.0 pg   MCHC 33.1 30.0 - 36.0 g/dL   RDW 36.615.4 44.011.5 - 34.715.5 %   Platelets 256 150 - 400 K/uL   Neutrophils Relative % 64 %   Neutro Abs 4.2 1.7 - 7.7 K/uL   Lymphocytes Relative 23 %   Lymphs Abs 1.5 0.7 - 4.0 K/uL   Monocytes Relative 11 %   Monocytes Absolute 0.7 0.1 - 1.0 K/uL   Eosinophils Relative 1 %   Eosinophils Absolute 0.1 0.0 - 0.7 K/uL   Basophils Relative 1 %   Basophils Absolute 0.0 0.0 - 0.1 K/uL  Basic metabolic panel  Result Value Ref Range   Sodium 137 135 - 145 mmol/L   Potassium 3.7 3.5 - 5.1 mmol/L   Chloride 104 101 - 111 mmol/L   CO2 27 22 - 32 mmol/L   Glucose, Bld 114 (H) 65 - 99 mg/dL   BUN 21 (H) 6 - 20 mg/dL   Creatinine, Ser 4.251.21 (H) 0.44 - 1.00 mg/dL   Calcium 8.9 8.9 - 95.610.3 mg/dL   GFR calc non Af Amer 42 (L) >60 mL/min   GFR calc Af Amer 49 (L) >60 mL/min   Anion gap 6 5 - 15  Troponin I  Result Value Ref Range   Troponin I <0.03 <0.03 ng/mL   Dg Chest 2 View  Result Date: 09/16/2015 CLINICAL DATA:  Dizziness and chest discomfort. EXAM: CHEST  2 VIEW COMPARISON:  Chest radiograph 04/15/2015. FINDINGS: The lungs are well inflated. Cardiomediastinal contours are normal. Round opacities overlying both lower lungs on the  frontal projection are unchanged and likely represent nipple shadows. No focal airspace consolidation or pulmonary edema. IMPRESSION: 1. Round opacity overlying the right lung base, possibly a nipple shadow. Repeat chest radiograph with nipple markers is recommended for confirmation and to exclude the possibility of a pulmonary nodule. 2. No focal airspace consolidation or pulmonary edema. Electronically Signed   By: Deatra RobinsonKevin  Herman M.D.   On: 09/16/2015 06:39     Radiology No results found.  Procedures Procedures (including critical care time)  Medications Ordered  in ED Medications  gi cocktail (Maalox,Lidocaine,Donnatal) (not administered)     Initial Impression / Assessment and Plan / ED Course  I have reviewed the triage vital signs and the nursing notes.  Pertinent labs & imaging results that were available during my care of the patient were reviewed by me and considered in my medical decision making (see chart for details).  Clinical Course      Final Clinical Impressions(s) / ED Diagnoses   Final diagnoses:  None  Chest pain  Patient's HEART score is 4, patient's MACE is ~16% and based on current guidelines requires inpatient rule out and testing.  [facility].    New Prescriptions New Prescriptions   No medications on file     Keoni Havey, MD 09/16/15 [phone]

## 2015-09-17 ENCOUNTER — Observation Stay (HOSPITAL_COMMUNITY): Payer: Medicare Other

## 2015-09-17 ENCOUNTER — Observation Stay (HOSPITAL_BASED_OUTPATIENT_CLINIC_OR_DEPARTMENT_OTHER): Payer: Medicare Other

## 2015-09-17 DIAGNOSIS — R938 Abnormal findings on diagnostic imaging of other specified body structures: Secondary | ICD-10-CM

## 2015-09-17 DIAGNOSIS — R079 Chest pain, unspecified: Secondary | ICD-10-CM | POA: Diagnosis not present

## 2015-09-17 DIAGNOSIS — R0789 Other chest pain: Principal | ICD-10-CM

## 2015-09-17 DIAGNOSIS — R739 Hyperglycemia, unspecified: Secondary | ICD-10-CM

## 2015-09-17 DIAGNOSIS — K219 Gastro-esophageal reflux disease without esophagitis: Secondary | ICD-10-CM | POA: Diagnosis not present

## 2015-09-17 DIAGNOSIS — I10 Essential (primary) hypertension: Secondary | ICD-10-CM

## 2015-09-17 LAB — ECHOCARDIOGRAM COMPLETE
Height: 66 in
Weight: 2640 oz

## 2015-09-17 LAB — NM MYOCAR MULTI W/SPECT W/WALL MOTION / EF
CHL CUP MPHR: 143 {beats}/min
CSEPHR: 80 %
CSEPPHR: 115 {beats}/min
Estimated workload: 1 METS
Rest HR: 84 {beats}/min

## 2015-09-17 LAB — HEMOGLOBIN A1C
HEMOGLOBIN A1C: 5.7 % — AB (ref 4.8–5.6)
Mean Plasma Glucose: 117 mg/dL

## 2015-09-17 LAB — LIPID PANEL
Cholesterol: 130 mg/dL (ref 0–200)
HDL: 52 mg/dL (ref >40)
LDL CALC: 70 mg/dL (ref 0–99)
Total CHOL/HDL Ratio: 2.5 RATIO
Triglycerides: 38 mg/dL (ref <150)
VLDL: 8 mg/dL (ref 0–40)

## 2015-09-17 MED ORDER — REGADENOSON 0.4 MG/5ML IV SOLN
INTRAVENOUS | Status: AC
  Filled 2015-09-17: qty 5

## 2015-09-17 MED ORDER — REGADENOSON 0.4 MG/5ML IV SOLN
0.4000 mg | Freq: Once | INTRAVENOUS | Status: AC
  Administered 2015-09-17: 0.4 mg via INTRAVENOUS
  Filled 2015-09-17: qty 5

## 2015-09-17 MED ORDER — TECHNETIUM TC 99M TETROFOSMIN IV KIT
10.0000 | PACK | Freq: Once | INTRAVENOUS | Status: AC | PRN
  Administered 2015-09-17: 10 via INTRAVENOUS

## 2015-09-17 MED ORDER — TECHNETIUM TC 99M TETROFOSMIN IV KIT
30.0000 | PACK | Freq: Once | INTRAVENOUS | Status: AC | PRN
  Administered 2015-09-17: 30 via INTRAVENOUS

## 2015-09-17 NOTE — Progress Notes (Signed)
Patient is discharge to home accompanied by family members  and NT via wheelchair. Discharge instructions given . Patient verbalizes understanding. All personal belongings given. Telemetry box and IV removed prior to discharge and site in good condition.

## 2015-09-17 NOTE — Discharge Summary (Signed)
Physician Discharge Summary  Dominique Ochoa EXB:[id number] DOB: Mar 12, 1940 DOA: 09/16/2015  PCP: Albertina Senegal, MD  Admit date: 09/16/2015 Discharge date: 09/17/2015  Admitted From: Home Disposition: Home  Recommendations for Outpatient Follow-up:  1. Follow up with PCP in 1-2 weeks 2. Please obtain BMP/CBC in one week  Home Health: No Equipment/Devices: N/A  Discharge Condition: Stable CODE STATUS: Full code Diet recommendation: Heart Healthy   Brief/Interim Summary: Dominique Ochoa is a 75 y.o. female with medical history significant for asthma, hypothyroidism, GERD, hypertension who presented to [facility] after awakening to go to the bathroom and subsequently developing chest pressure. Patient reports she initially felt "gassy" and later developed what were similar to indigestion symptoms with chest pressure in the epigastric region radiating up into the throat. She attempted to burp without any improvement in her symptoms in the discomfort persisted. Patient presented to the above stated ER and was given a GI cocktail with resolution of her symptoms. She reports the symptoms are similar to previous reflux symptoms. She reports that her son came by and brought her a high fat content when these meal consisting of a loaded baked potato and some chicken nuggets. She is not had any exertional symptoms such as chest pain or shortness of breath during or after basic activity. She has not noticed any excessive fatigue or any decrease in exercise or activity tolerance. Initial EKG and troponin were reassuring. Because of a heart score 4 patient was sent to this facility for further evaluation for chest discomfort.  Discharge Diagnoses:  Principal Problem:   Pain in the chest Active Problems:   HTN (hypertension)   Hypothyroidism   Asthma   GERD (gastroesophageal reflux disease)   Hyperglycemia   Abnormal finding on chest xray   Chest pain -Presented with atypical chest pain, currently chest  pain-free. -3 sets of cardiac enzymes were negative for cardiac ischemia as well as 12-lead EKG. -Myoview stress test in a.m. as long as enzymes remain negative  -Fasting lipid showed total cholesterol of 130, LDL of 70 and HDL of 54. -On aspirin. -Myoview stress testing was done, showed no evidence of ischemia, patient discharged home to follow-up with PCP. -Chest pain is likely secondary to GERD.  Abnormal finding on chest xray -PA and lateral x-ray suggesting this is a nipple shadow  HTN (hypertension) -Continue preadmission lisinopril with hydrochlorothiazide  GERD (gastroesophageal reflux disease) -Based on history patient's symptoms seem related to reflux -Continue preadmission PPI, instructed to avoid spicy food. -prn GI cocktail -Normal LFTs.  Hyperglycemia -Hemoglobin A1c  Hypothyroidism -Continue Synthroid  Asthma -Currently not actively wheezing -Continue Singulair -prn albuterol MDI  Discharge Instructions  Discharge Instructions    Diet - low sodium heart healthy    Complete by:  As directed   Increase activity slowly    Complete by:  As directed       Medication List    TAKE these medications   albuterol 108 (90 Base) MCG/ACT inhaler Commonly known as:  PROVENTIL HFA;VENTOLIN HFA Inhale 1 puff into the lungs every 6 (six) hours as needed for wheezing or shortness of breath.   aspirin 81 MG tablet Take 81 mg by mouth every other day.   levothyroxine 25 MCG tablet Commonly known as:  SYNTHROID, LEVOTHROID Take 25 mcg by mouth daily before breakfast.   lisinopril-hydrochlorothiazide 20-25 MG tablet Commonly known as:  PRINZIDE,ZESTORETIC Take 1 tablet by mouth daily.   montelukast 10 MG tablet Commonly known as:  SINGULAIR Take 10 mg by mouth daily.  multivitamin with minerals Tabs tablet Take 1 tablet by mouth daily.   pantoprazole 40 MG tablet Commonly known as:  PROTONIX Take 40 mg by mouth 2 (two) times daily.       Follow-up Information    POLLOCK, NELSON, MD Follow up in 1 week(s).   Specialty:  Internal Medicine Contact information: [address] [place] [address] [phone]          Allergies  Allergen Reactions  . Fish Allergy Other (See Comments)    Makes her weak  . Fish Oil Other (See Comments)    MD orders    Consultations:  None   Procedures/Studies: Dg Chest 2 View  Result Date: 09/16/2015 CLINICAL DATA:  Dizziness and chest discomfort. EXAM: CHEST  2 VIEW COMPARISON:  Chest radiograph 04/15/2015. FINDINGS: The lungs are well inflated. Cardiomediastinal contours are normal. Round opacities overlying both lower lungs on the frontal projection are unchanged and likely represent nipple shadows. No focal airspace consolidation or pulmonary edema. IMPRESSION: 1. Round opacity overlying the right lung base, possibly a nipple shadow. Repeat chest radiograph with nipple markers is recommended for confirmation and to exclude the possibility of a pulmonary nodule. 2. No focal airspace consolidation or pulmonary edema. Electronically Signed   By: Deatra Robinson M.D.   On: 09/16/2015 06:39   Nm Myocar Multi W/spect W/wall Motion / Ef  Result Date: 09/17/2015 CLINICAL DATA:  Chest pain. EXAM: MYOCARDIAL IMAGING WITH SPECT (REST AND PHARMACOLOGIC-STRESS) GATED LEFT VENTRICULAR WALL MOTION STUDY LEFT VENTRICULAR EJECTION FRACTION TECHNIQUE: Standard myocardial SPECT imaging was performed after resting intravenous injection of 10 mCi Tc-23m tetrofosmin. Subsequently, intravenous infusion of Lexiscan was performed under the supervision of the Cardiology staff. At peak effect of the drug, 30 mCi Tc-29m tetrofosmin was injected intravenously and standard myocardial SPECT imaging was performed. Quantitative gated imaging was also performed to evaluate left ventricular wall motion, and estimate left ventricular ejection fraction. COMPARISON:  None. FINDINGS: Perfusion: No decreased activity in  the left ventricle on stress imaging to suggest reversible ischemia or infarction. Wall Motion: Normal left ventricular wall motion. No left ventricular dilation. Left Ventricular Ejection Fraction: 79 % End diastolic volume 57 ml End systolic volume 12 ml IMPRESSION: 1. No reversible ischemia or infarction. 2. Normal left ventricular wall motion. 3. Left ventricular ejection fraction 79% 4. Non invasive risk stratification*: Low *2012 Appropriate Use Criteria for Coronary Revascularization Focused Update: J Am Coll Cardiol. 2012;59(9):857-881. http://content.dementiazones.com.aspx?articleid=[email or web address] Electronically Signed   By: Irish Lack M.D.   On: 09/17/2015 15:49   Dg Chest Port 1 View  Result Date: 09/17/2015 CLINICAL DATA:  Abnormal finding on chest xray. Nipple markers were placed EXAM: PORTABLE CHEST 1 VIEW COMPARISON:  1 day prior FINDINGS: Repeat frontal radiograph. Numerous leads and wires project over the chest. Patient rotated left. Midline trachea. Cardiomegaly. Atherosclerosis in the transverse aorta. No pleural effusion or pneumothorax. right-sided nipple marker in place. The density projecting over the right lung base is less distinct today, but corresponds to the nipple shadow. No congestive failure. IMPRESSION: Cardiomegaly, without congestive failure or acute disease. The density described on the prior chest radiograph represented a nipple shadow. Aortic atherosclerosis. Electronically Signed   By: Jeronimo Greaves M.D.   On: 09/17/2015 07:46    (Echo, Carotid, EGD, Colonoscopy, ERCP)    Subjective:   Discharge Exam: Vitals:   09/17/15 1149 09/17/15 1151  BP: 122/80 (!) 122/58  Pulse: (!) 110 (!) 107  Resp:    Temp:  Vitals:   09/17/15 1130 09/17/15 1147 09/17/15 1149 09/17/15 1151  BP:  (!) 151/79 122/80 (!) 122/58  Pulse: 75 (!) 102 (!) 110 (!) 107  Resp:      Temp:      TempSrc:      SpO2:      Weight:      Height:        General: Pt is alert, awake, not  in acute distress Cardiovascular: RRR, S1/S2 +, no rubs, no gallops Respiratory: CTA bilaterally, no wheezing, no rhonchi Abdominal: Soft, NT, ND, bowel sounds + Extremities: no edema, no cyanosis    The results of significant diagnostics from this hospitalization (including imaging, microbiology, ancillary and laboratory) are listed below for reference.     Microbiology: No results found for this or any previous visit (from the past 240 hour(s)).   Labs: BNP (last 3 results) No results for input(s): BNP in the last 8760 hours. Basic Metabolic Panel:  Recent Labs Lab 09/16/15 0545  NA 137  K 3.7  CL 104  CO2 27  GLUCOSE 114*  BUN 21*  CREATININE 1.21*  CALCIUM 8.9   Liver Function Tests:  Recent Labs Lab 09/16/15 1052  AST 25  ALT 14  ALKPHOS 62  BILITOT 0.5  PROT 6.7  ALBUMIN 3.5   No results for input(s): LIPASE, AMYLASE in the last 168 hours. No results for input(s): AMMONIA in the last 168 hours. CBC:  Recent Labs Lab 09/16/15 0545  WBC 6.5  NEUTROABS 4.2  HGB 11.9*  HCT 35.9*  MCV 82.9  PLT 256   Cardiac Enzymes:  Recent Labs Lab 09/16/15 0545 09/16/15 1056 09/16/15 1516 09/16/15 2219  TROPONINI <0.03 <0.03 <0.03 <0.03   BNP: Invalid input(s): POCBNP CBG: No results for input(s): GLUCAP in the last 168 hours. D-Dimer No results for input(s): DDIMER in the last 72 hours. Hgb A1c  Recent Labs  09/16/15 1052  HGBA1C 5.7*   Lipid Profile  Recent Labs  09/17/15 0304  CHOL 130  HDL 52  LDLCALC 70  TRIG 38  CHOLHDL 2.5   Thyroid function studies No results for input(s): TSH, T4TOTAL, T3FREE, THYROIDAB in the last 72 hours.  Invalid input(s): FREET3 Anemia work up No results for input(s): VITAMINB12, FOLATE, FERRITIN, TIBC, IRON, RETICCTPCT in the last 72 hours. Urinalysis    Component Value Date/Time   COLORURINE YELLOW 04/15/2015 0425   APPEARANCEUR CLEAR 04/15/2015 0425   LABSPEC 1.006 04/15/2015 0425   PHURINE 6.5  04/15/2015 0425   GLUCOSEU NEGATIVE 04/15/2015 0425   HGBUR NEGATIVE 04/15/2015 0425   BILIRUBINUR NEGATIVE 04/15/2015 0425   KETONESUR NEGATIVE 04/15/2015 0425   PROTEINUR NEGATIVE 04/15/2015 0425   UROBILINOGEN 1.0 12/03/2013 2108   NITRITE NEGATIVE 04/15/2015 0425   LEUKOCYTESUR NEGATIVE 04/15/2015 0425   Sepsis Labs Invalid input(s): PROCALCITONIN,  WBC,  LACTICIDVEN Microbiology No results found for this or any previous visit (from the past 240 hour(s)).   Time coordinating discharge: Over 30 minutes  SIGNED:   Clint Lipps, MD  [facility] 09/17/2015, 5:36 PM Pager   If 7PM-7AM, please contact night-coverage [email or web address] Password [email or web address]

## 2015-09-17 NOTE — Progress Notes (Signed)
The patient was seen in nuclear medicine for a Lexiscan myoview. She tolerated the procedure well. No acute ST or TW changes on ECG.   STRESS IMAGES TO FOLLOW   Erin Smith, NP  [facility]  

## 2015-09-17 NOTE — Progress Notes (Signed)
Echocardiogram 2D Echocardiogram has been performed.  Dominique BasemanReel, Dominique Ochoa 09/17/2015, 10:36 AM

## 2015-09-17 NOTE — Progress Notes (Signed)
PROGRESS NOTE  Dominique Ochoa  NWG:[id number] DOB: [birth year] DOA: 09/16/2015 PCP: Albertina Senegal, MD Outpatient Specialists:  Subjective: Denies any chest pain.  Brief Narrative:  75 year old female with past medical history of GERD came in with atypical chest pain, heart score 4. Observation and stress test today, if it's negative she will be discharged home if it's positive cardiology will be consulted.  Assessment & Plan:   Principal Problem:   Chest pain Active Problems:   HTN (hypertension)   Hypothyroidism   Asthma   GERD (gastroesophageal reflux disease)   Hyperglycemia   Abnormal finding on chest xray     Chest pain -Presented with atypical chest pain, currently chest pain-free. -3 sets of cardiac enzymes were negative for cardiac ischemia as well as 12-lead EKG. -Myoview stress test pending. -Myoview stress test in a.m. as long as enzymes remain negative  -Fasting lipid showed total cholesterol of 130, LDL of 70 and HDL of 54. -On aspirin.    Abnormal finding on chest xray -PA and lateral x-ray suggesting this is a nipple shadow    HTN (hypertension) -Continue preadmission lisinopril with hydrochlorothiazide    GERD (gastroesophageal reflux disease) -Based on history patient's symptoms seem related to reflux -Continue preadmission PPI -prn GI cocktail -Normal LFTs.    Hyperglycemia -Hemoglobin A1c    Hypothyroidism -Continue Synthroid    Asthma -Currently not actively wheezing -Continue Singulair -prn albuterol MDI   DVT prophylaxis:  Code Status: DNR Family Communication:  Disposition Plan:  Diet: Diet NPO time specified  Consultants:   None  Procedures:   None  Antimicrobials:   None   Objective: Vitals:   09/16/15 1157 09/16/15 2114 09/17/15 0038 09/17/15 0511  BP: (!) 137/50 (!) 106/46 104/63 121/65  Pulse: 67 74 84 79  Resp: 18 18 18 18   Temp: 97.8 F (36.6 C) 98.4 F (36.9 C) 98.6 F (37 C) 98.4 F (36.9 C)    TempSrc: Oral Oral Oral Oral  SpO2: 100% 100% 98% 100%  Weight:    74.8 kg (165 lb)  Height:        Intake/Output Summary (Last 24 hours) at 09/17/15 1026 Last data filed at 09/17/15 0900  Gross per 24 hour  Intake              360 ml  Output              400 ml  Net              -40 ml   Filed Weights   09/16/15 0538 09/16/15 0853 09/17/15 0511  Weight: 76.2 kg (168 lb) 75.2 kg (165 lb 11.2 oz) 74.8 kg (165 lb)    Examination: General exam: Appears calm and comfortable  Respiratory system: Clear to auscultation. Respiratory effort normal. Cardiovascular system: S1 & S2 heard, RRR. No JVD, murmurs, rubs, gallops or clicks. No pedal edema. Gastrointestinal system: Abdomen is nondistended, soft and nontender. No organomegaly or masses felt. Normal bowel sounds heard. Central nervous system: Alert and oriented. No focal neurological deficits. Extremities: Symmetric 5 x 5 power. Skin: No rashes, lesions or ulcers Psychiatry: Judgement and insight appear normal. Mood & affect appropriate.   Data Reviewed: I have personally reviewed following labs and imaging studies  CBC:  Recent Labs Lab 09/16/15 0545  WBC 6.5  NEUTROABS 4.2  HGB 11.9*  HCT 35.9*  MCV 82.9  PLT 256   Basic Metabolic Panel:  Recent Labs Lab 09/16/15 0545  NA 137  K 3.7  CL 104  CO2 27  GLUCOSE 114*  BUN 21*  CREATININE 1.21*  CALCIUM 8.9   GFR: Estimated Creatinine Clearance: 40.3 mL/min (by C-G formula based on SCr of 1.21 mg/dL). Liver Function Tests:  Recent Labs Lab 09/16/15 1052  AST 25  ALT 14  ALKPHOS 62  BILITOT 0.5  PROT 6.7  ALBUMIN 3.5   No results for input(s): LIPASE, AMYLASE in the last 168 hours. No results for input(s): AMMONIA in the last 168 hours. Coagulation Profile: No results for input(s): INR, PROTIME in the last 168 hours. Cardiac Enzymes:  Recent Labs Lab 09/16/15 0545 09/16/15 1056 09/16/15 1516 09/16/15 2219  TROPONINI <0.03 <0.03 <0.03 <0.03    BNP (last 3 results) No results for input(s): PROBNP in the last 8760 hours. HbA1C: No results for input(s): HGBA1C in the last 72 hours. CBG: No results for input(s): GLUCAP in the last 168 hours. Lipid Profile:  Recent Labs  09/17/15 0304  CHOL 130  HDL 52  LDLCALC 70  TRIG 38  CHOLHDL 2.5   Thyroid Function Tests: No results for input(s): TSH, T4TOTAL, FREET4, T3FREE, THYROIDAB in the last 72 hours. Anemia Panel: No results for input(s): VITAMINB12, FOLATE, FERRITIN, TIBC, IRON, RETICCTPCT in the last 72 hours. Urine analysis:    Component Value Date/Time   COLORURINE YELLOW 04/15/2015 0425   APPEARANCEUR CLEAR 04/15/2015 0425   LABSPEC 1.006 04/15/2015 0425   PHURINE 6.5 04/15/2015 0425   GLUCOSEU NEGATIVE 04/15/2015 0425   HGBUR NEGATIVE 04/15/2015 0425   BILIRUBINUR NEGATIVE 04/15/2015 0425   KETONESUR NEGATIVE 04/15/2015 0425   PROTEINUR NEGATIVE 04/15/2015 0425   UROBILINOGEN 1.0 12/03/2013 2108   NITRITE NEGATIVE 04/15/2015 0425   LEUKOCYTESUR NEGATIVE 04/15/2015 0425   Sepsis Labs: @LABRCNTIP (procalcitonin:4,lacticidven:4)  )No results found for this or any previous visit (from the past 240 hour(s)).   Invalid input(s): PROCALCITONIN, LACTICACIDVEN   Radiology Studies: Dg Chest 2 View  Result Date: 09/16/2015 CLINICAL DATA:  Dizziness and chest discomfort. EXAM: CHEST  2 VIEW COMPARISON:  Chest radiograph 04/15/2015. FINDINGS: The lungs are well inflated. Cardiomediastinal contours are normal. Round opacities overlying both lower lungs on the frontal projection are unchanged and likely represent nipple shadows. No focal airspace consolidation or pulmonary edema. IMPRESSION: 1. Round opacity overlying the right lung base, possibly a nipple shadow. Repeat chest radiograph with nipple markers is recommended for confirmation and to exclude the possibility of a pulmonary nodule. 2. No focal airspace consolidation or pulmonary edema. Electronically Signed   By:  Deatra Robinson M.D.   On: 09/16/2015 06:39   Dg Chest Port 1 View  Result Date: 09/17/2015 CLINICAL DATA:  Abnormal finding on chest xray. Nipple markers were placed EXAM: PORTABLE CHEST 1 VIEW COMPARISON:  1 day prior FINDINGS: Repeat frontal radiograph. Numerous leads and wires project over the chest. Patient rotated left. Midline trachea. Cardiomegaly. Atherosclerosis in the transverse aorta. No pleural effusion or pneumothorax. right-sided nipple marker in place. The density projecting over the right lung base is less distinct today, but corresponds to the nipple shadow. No congestive failure. IMPRESSION: Cardiomegaly, without congestive failure or acute disease. The density described on the prior chest radiograph represented a nipple shadow. Aortic atherosclerosis. Electronically Signed   By: Jeronimo Greaves M.D.   On: 09/17/2015 07:46        Scheduled Meds: . aspirin EC  81 mg Oral QODAY  . docusate sodium  100 mg Oral BID  . enoxaparin (LOVENOX) injection  40 mg Subcutaneous Q24H  .  hydrochlorothiazide  25 mg Oral Daily  . levothyroxine  25 mcg Oral QAC breakfast  . lisinopril  20 mg Oral Daily  . montelukast  10 mg Oral QHS  . multivitamin with minerals  1 tablet Oral Daily  . pantoprazole  40 mg Oral BID  . polyethylene glycol  17 g Oral Daily   Continuous Infusions:    LOS: 0 days    Time spent: 35 minutes    Crit Obremski A, MD [facility] Pager [phone]  If 7PM-7AM, please contact night-coverage [email or web address] Password [email or web address] 09/17/2015, 10:26 AM

## 2016-04-16 ENCOUNTER — Emergency Department (HOSPITAL_COMMUNITY)
Admission: EM | Admit: 2016-04-16 | Discharge: 2016-04-16 | Disposition: A | Discharge status: Home / Self Care | Payer: Medicare Other | Attending: Emergency Medicine | Admitting: Emergency Medicine

## 2016-04-16 ENCOUNTER — Encounter (HOSPITAL_COMMUNITY): Payer: Self-pay

## 2016-04-16 ENCOUNTER — Emergency Department (HOSPITAL_COMMUNITY): Payer: Medicare Other

## 2016-04-16 DIAGNOSIS — Z79899 Other long term (current) drug therapy: Secondary | ICD-10-CM | POA: Diagnosis not present

## 2016-04-16 DIAGNOSIS — J45909 Unspecified asthma, uncomplicated: Secondary | ICD-10-CM | POA: Diagnosis not present

## 2016-04-16 DIAGNOSIS — Z7982 Long term (current) use of aspirin: Secondary | ICD-10-CM | POA: Diagnosis not present

## 2016-04-16 DIAGNOSIS — R002 Palpitations: Secondary | ICD-10-CM | POA: Insufficient documentation

## 2016-04-16 DIAGNOSIS — I10 Essential (primary) hypertension: Secondary | ICD-10-CM | POA: Insufficient documentation

## 2016-04-16 DIAGNOSIS — E039 Hypothyroidism, unspecified: Secondary | ICD-10-CM | POA: Diagnosis not present

## 2016-04-16 DIAGNOSIS — R0602 Shortness of breath: Secondary | ICD-10-CM | POA: Diagnosis present

## 2016-04-16 LAB — COMPREHENSIVE METABOLIC PANEL
ALBUMIN: 3.7 g/dL (ref 3.5–5.0)
ALT: 18 U/L (ref 14–54)
AST: 31 U/L (ref 15–41)
Alkaline Phosphatase: 65 U/L (ref 38–126)
Anion gap: 13 (ref 5–15)
BILIRUBIN TOTAL: 0.7 mg/dL (ref 0.3–1.2)
BUN: 10 mg/dL (ref 6–20)
CHLORIDE: 105 mmol/L (ref 101–111)
CO2: 23 mmol/L (ref 22–32)
Calcium: 9.3 mg/dL (ref 8.9–10.3)
Creatinine, Ser: 1 mg/dL (ref 0.44–1.00)
GFR calc Af Amer: >60 mL/min (ref >60)
GFR calc non Af Amer: 53 mL/min — ABNORMAL LOW (ref >60)
Glucose, Bld: 101 mg/dL — ABNORMAL HIGH (ref 65–99)
POTASSIUM: 3.4 mmol/L — AB (ref 3.5–5.1)
SODIUM: 141 mmol/L (ref 135–145)
Total Protein: 7 g/dL (ref 6.5–8.1)

## 2016-04-16 LAB — CBC WITH DIFFERENTIAL/PLATELET
Basophils Absolute: 0 10*3/uL (ref 0.0–0.1)
Basophils Relative: 1 %
Eosinophils Absolute: 0 10*3/uL (ref 0.0–0.7)
Eosinophils Relative: 1 %
HEMATOCRIT: 37.4 % (ref 36.0–46.0)
Hemoglobin: 11.9 g/dL — ABNORMAL LOW (ref 12.0–15.0)
Lymphocytes Relative: 22 %
Lymphs Abs: 1 10*3/uL (ref 0.7–4.0)
MCH: 25.8 pg — ABNORMAL LOW (ref 26.0–34.0)
MCHC: 31.8 g/dL (ref 30.0–36.0)
MCV: 81 fL (ref 78.0–100.0)
MONO ABS: 0.4 10*3/uL (ref 0.1–1.0)
Monocytes Relative: 9 %
NEUTROS ABS: 3.1 10*3/uL (ref 1.7–7.7)
NEUTROS PCT: 67 %
PLATELETS: 208 10*3/uL (ref 150–400)
RBC: 4.62 MIL/uL (ref 3.87–5.11)
RDW: 15.9 % — AB (ref 11.5–15.5)
WBC: 4.6 10*3/uL (ref 4.0–10.5)

## 2016-04-16 LAB — TROPONIN I: Troponin I: <0.03 ng/mL (ref <0.03)

## 2016-04-16 LAB — CBG MONITORING, ED: Glucose-Capillary: 94 mg/dL (ref 65–99)

## 2016-04-16 NOTE — ED Provider Notes (Signed)
[facility]-EMERGENCY DEPT Provider Note   CSN: [id number] Arrival date & time: 04/16/16  0930     History   Chief Complaint Chief Complaint  Patient presents with  . Shortness of Breath    HPI Dominique Ochoa is a 76 y.o. female.  HPI Patient presents after an episode of near-syncope, palpitations. Episode occurred about one hour prior to my evaluation. Patient states that she was cooking breakfast, felt mildly lightheaded, felt palpitations, but no pain. This lasted about 10 minutes, stop without intervention. Patient was able to continue a telephone conversation during the symptoms. Now patient has no complaints. She denies prior similar events, states that she is generally well, though she acknowledges history of multiple other medical issues.  Past Medical History:  Diagnosis Date  . Asthma   . GERD (gastroesophageal reflux disease)   . Hypertension   . Thyroid disease     Patient Active Problem List   Diagnosis Date Noted  . Pain in the chest 09/16/2015  . HTN (hypertension) 09/16/2015  . Hypothyroidism 09/16/2015  . Asthma 09/16/2015  . GERD (gastroesophageal reflux disease) 09/16/2015  . Hyperglycemia 09/16/2015  . Abnormal finding on chest xray 09/16/2015    History reviewed. No pertinent surgical history.  OB History    No data available       Home Medications    Prior to Admission medications   Medication Sig Start Date End Date Taking? Authorizing Provider  albuterol (PROVENTIL HFA;VENTOLIN HFA) 108 (90 Base) MCG/ACT inhaler Inhale 1 puff into the lungs every 6 (six) hours as needed for wheezing or shortness of breath.   Yes Historical Provider, MD  aspirin 81 MG tablet Take 81 mg by mouth every other day.    Yes Historical Provider, MD  levothyroxine (SYNTHROID, LEVOTHROID) 25 MCG tablet Take 25 mcg by mouth daily before breakfast.   Yes Historical Provider, MD  lisinopril-hydrochlorothiazide (PRINZIDE,ZESTORETIC) 20-25 MG tablet Take 1 tablet by mouth  daily.   Yes Historical Provider, MD  montelukast (SINGULAIR) 10 MG tablet Take 10 mg by mouth daily.    Yes Historical Provider, MD  Multiple Vitamin (MULTIVITAMIN WITH MINERALS) TABS tablet Take 1 tablet by mouth daily.   Yes Historical Provider, MD  pantoprazole (PROTONIX) 40 MG tablet Take 40 mg by mouth 2 (two) times daily.   Yes Historical Provider, MD  Vitamin D, Ergocalciferol, (DRISDOL) 50000 units CAPS capsule Take 50,000 Units by mouth every 7 (seven) days. Tuesday of each week   Yes Historical Provider, MD    Family History No family history on file.  Social History Social History  Substance Use Topics  . Smoking status: Never Smoker  . Smokeless tobacco: Never Used  . Alcohol use No     Allergies   Fish allergy and Fish oil   Review of Systems Review of Systems  Constitutional:       Per HPI, otherwise negative  HENT:       Per HPI, otherwise negative  Respiratory:       Per HPI, otherwise negative  Cardiovascular:       Per HPI, otherwise negative  Gastrointestinal: Negative for vomiting.  Endocrine:       Negative aside from HPI  Genitourinary:       Neg aside from HPI   Musculoskeletal:       Per HPI, otherwise negative  Skin: Negative.   Allergic/Immunologic: Negative for immunocompromised state.  Neurological: Positive for light-headedness. Negative for syncope and weakness.     Physical Exam  Updated Vital Signs There were no vitals taken for this visit.  Physical Exam  Constitutional: She is oriented to person, place, and time. She appears well-developed and well-nourished. No distress.  HENT:  Head: Normocephalic and atraumatic.  Eyes: Conjunctivae and EOM are normal.  Cardiovascular: Normal rate and regular rhythm.   Pulmonary/Chest: Effort normal and breath sounds normal. No stridor. No respiratory distress.  Abdominal: She exhibits no distension.  Musculoskeletal: She exhibits no edema.  Neurological: She is alert and oriented to  person, place, and time. No cranial nerve deficit.  Skin: Skin is warm and dry.  Psychiatric: She has a normal mood and affect.  Nursing note and vitals reviewed.    ED Treatments / Results  Labs (all labs ordered are listed, but only abnormal results are displayed) Labs Reviewed  CBC WITH DIFFERENTIAL/PLATELET  COMPREHENSIVE METABOLIC PANEL  TROPONIN I  POCT CBG (FASTING - GLUCOSE)-MANUAL ENTRY  CBG MONITORING, ED    EKG  EKG Interpretation  Date/Time:  Tuesday April 16 2016 09:52:43 EDT Ventricular Rate:  84 PR Interval:    QRS Duration: 148 QT Interval:  415 QTC Calculation: 491 R Axis:   -51 Text Interpretation:  Sinus rhythm Ventricular bigeminy Probable left atrial enlargement RBBB and LAFB Left ventricular hypertrophy No significant change since last tracing Abnormal ekg Confirmed by Gerhard MunchLOCKWOOD, Saud Bail  MD [phone]([id number]) on 04/16/2016 11:18:13 AM       Radiology Dg Chest 2 View  Result Date: 04/16/2016 CLINICAL DATA:  Shortness of breath, tachycardia EXAM: CHEST  2 VIEW COMPARISON:  01/10/2016 FINDINGS: There is hyperinflation of the lungs compatible with COPD. Mild cardiomegaly. Nodular densities project over both lower lungs compatible with nipple shadows. No confluent airspace opacities or effusions. No acute bony abnormality. IMPRESSION: COPD.  Cardiomegaly.  No active disease. Electronically Signed   By: Charlett NoseKevin  Dover M.D.   On: 04/16/2016 10:36    Procedures Procedures (including critical care time)   Initial Impression / Assessment and Plan / ED Course  I have reviewed the triage vital signs and the nursing notes.  Pertinent labs & imaging results that were available during my care of the patient were reviewed by me and considered in my medical decision making (see chart for details).  On repeat exam the patient is awake and alert, continues to deny any ongoing symptoms. She is to family members with her. We discussed all findings at length including reassuring  x-ray, labs, EKG. Given the patient's history of pulmonary disease, or some suspicion for this is contributing to her illness. No evidence for arrhythmia, ACS, PE, pneumonia. With reassuring findings, patient was discharged in stable condition to follow-up with cardiology for consideration of Holter monitoring.   Final Clinical Impressions(s) / ED Diagnoses   Final diagnoses:  SOB (shortness of breath)  Palpitations     Gerhard Munchobert Dhyana Bastone, MD 04/16/16 1526

## 2016-04-16 NOTE — ED Triage Notes (Signed)
Pt. Was eating breakfast and had and episode of sob with the feeling of passing out.  It lasted for 10 minutes and at present time all symptoms have resolved.   No sob , no chest pain .  Pt. Denies any symptoms at this time.  Skin is warm and dry.  Pt. Is alert and oriented X4.

## 2016-04-16 NOTE — Discharge Instructions (Signed)
As discussed, your evaluation today has been largely reassuring.  But, it is important that you monitor your condition carefully, and do not hesitate to return to the ED if you develop new, or concerning changes in your condition. ? ?Otherwise, please follow-up with your physician for appropriate ongoing care. ? ?

## 2016-04-16 NOTE — ED Notes (Signed)
Pt verbalized understanding of discharge instructions and denies any further questions at this time.   

## 2016-05-22 DIAGNOSIS — I471 Supraventricular tachycardia, unspecified: Secondary | ICD-10-CM

## 2016-05-22 HISTORY — DX: Supraventricular tachycardia: I47.1

## 2016-05-22 HISTORY — DX: Supraventricular tachycardia, unspecified: I47.10

## 2016-06-01 ENCOUNTER — Encounter (HOSPITAL_BASED_OUTPATIENT_CLINIC_OR_DEPARTMENT_OTHER): Payer: Self-pay | Admitting: Emergency Medicine

## 2016-06-01 ENCOUNTER — Emergency Department (HOSPITAL_BASED_OUTPATIENT_CLINIC_OR_DEPARTMENT_OTHER)
Admission: EM | Admit: 2016-06-01 | Discharge: 2016-06-01 | Disposition: A | Discharge status: Home / Self Care | Payer: Medicare Other | Attending: Emergency Medicine | Admitting: Emergency Medicine

## 2016-06-01 DIAGNOSIS — I10 Essential (primary) hypertension: Secondary | ICD-10-CM | POA: Diagnosis not present

## 2016-06-01 DIAGNOSIS — R5383 Other fatigue: Secondary | ICD-10-CM | POA: Diagnosis not present

## 2016-06-01 DIAGNOSIS — R5381 Other malaise: Secondary | ICD-10-CM | POA: Diagnosis not present

## 2016-06-01 DIAGNOSIS — J45909 Unspecified asthma, uncomplicated: Secondary | ICD-10-CM | POA: Diagnosis not present

## 2016-06-01 DIAGNOSIS — E039 Hypothyroidism, unspecified: Secondary | ICD-10-CM | POA: Insufficient documentation

## 2016-06-01 DIAGNOSIS — Z7982 Long term (current) use of aspirin: Secondary | ICD-10-CM | POA: Diagnosis not present

## 2016-06-01 DIAGNOSIS — R002 Palpitations: Secondary | ICD-10-CM | POA: Diagnosis not present

## 2016-06-01 DIAGNOSIS — Z79899 Other long term (current) drug therapy: Secondary | ICD-10-CM | POA: Diagnosis not present

## 2016-06-01 LAB — URINALYSIS, ROUTINE W REFLEX MICROSCOPIC
Bilirubin Urine: NEGATIVE
GLUCOSE, UA: NEGATIVE mg/dL
HGB URINE DIPSTICK: NEGATIVE
Ketones, ur: NEGATIVE mg/dL
LEUKOCYTES UA: NEGATIVE
Nitrite: NEGATIVE
PH: 7.5 (ref 5.0–8.0)
PROTEIN: NEGATIVE mg/dL
SPECIFIC GRAVITY, URINE: 1.002 — AB (ref 1.005–1.030)

## 2016-06-01 LAB — CBC WITH DIFFERENTIAL/PLATELET
Basophils Absolute: 0 10*3/uL (ref 0.0–0.1)
Basophils Relative: 1 %
Eosinophils Absolute: 0.1 10*3/uL (ref 0.0–0.7)
Eosinophils Relative: 2 %
HCT: 34.8 % — ABNORMAL LOW (ref 36.0–46.0)
Hemoglobin: 11.5 g/dL — ABNORMAL LOW (ref 12.0–15.0)
Lymphocytes Relative: 21 %
Lymphs Abs: 1.2 10*3/uL (ref 0.7–4.0)
MCH: 26.9 pg (ref 26.0–34.0)
MCHC: 33 g/dL (ref 30.0–36.0)
MCV: 81.5 fL (ref 78.0–100.0)
Monocytes Absolute: 0.8 10*3/uL (ref 0.1–1.0)
Monocytes Relative: 13 %
Neutro Abs: 3.7 10*3/uL (ref 1.7–7.7)
Neutrophils Relative %: 63 %
Platelets: ADEQUATE 10*3/uL (ref 150–400)
RBC: 4.27 MIL/uL (ref 3.87–5.11)
RDW: 15.5 % (ref 11.5–15.5)
WBC: 5.8 10*3/uL (ref 4.0–10.5)

## 2016-06-01 LAB — BASIC METABOLIC PANEL
Anion gap: 7 (ref 5–15)
BUN: 14 mg/dL (ref 6–20)
CO2: 25 mmol/L (ref 22–32)
Calcium: 9.1 mg/dL (ref 8.9–10.3)
Chloride: 108 mmol/L (ref 101–111)
Creatinine, Ser: 0.95 mg/dL (ref 0.44–1.00)
GFR calc Af Amer: >60 mL/min (ref >60)
GFR calc non Af Amer: 56 mL/min — ABNORMAL LOW (ref >60)
Glucose, Bld: 96 mg/dL (ref 65–99)
Potassium: 3.7 mmol/L (ref 3.5–5.1)
Sodium: 140 mmol/L (ref 135–145)

## 2016-06-01 NOTE — ED Provider Notes (Signed)
[facility] Provider Note   CSN: [id number] Arrival date & time: 06/01/16  1413   By signing my name below, I, Dominique Ochoa, attest that this documentation has been prepared under the direction and in the presence of Raeford RazorKohut, Jazlene Bares, MD. Electronically signed, Dominique Ochoa, ED Scribe. 06/01/16. 4:25 PM.  History   Chief Complaint Chief Complaint  Patient presents with  . Dizziness   The history is provided by the patient and medical records. No language interpreter was used.  Palpitations   This is a recurrent problem. The current episode started 12 to 24 hours ago. The problem occurs hourly. The problem has been gradually worsening. The problem is associated with an unknown factor. Associated symptoms include malaise/fatigue. Pertinent negatives include no chest pain, no near-syncope, no syncope, no abdominal pain, no lower extremity edema and no weakness. The treatment provided no relief.    Dominique Ochoa is a 76 y.o. female with h/o thyroid dz, HTN and asthma, who presents to the Emergency Department with concern for intermittent heart palpitations lasting 5 minutes at a time, onset today. Associated fatigue worsened today noted. She does not suspect any factors that bring on her palpitations. Baseline SOB noted. She states she was prescribed medications for heart palpitations via her PCP today, and she states this medication has worsened her fatigue. She adds her current symptoms are similar to symptoms she experienced with thyroid disease and low potassium levels. No chest pain, pain otherwise, leg swelling significant from baseline, or any other complaints noted at this time.  Past Medical History:  Diagnosis Date  . Asthma   . GERD (gastroesophageal reflux disease)   . Hypertension   . Thyroid disease     Patient Active Problem List   Diagnosis Date Noted  . Pain in the chest 09/16/2015  . HTN (hypertension) 09/16/2015  . Hypothyroidism 09/16/2015  . Asthma  09/16/2015  . GERD (gastroesophageal reflux disease) 09/16/2015  . Hyperglycemia 09/16/2015  . Abnormal finding on chest xray 09/16/2015    History reviewed. No pertinent surgical history.  OB History    No data available       Home Medications    Prior to Admission medications   Medication Sig Start Date End Date Taking? Authorizing Provider  albuterol (PROVENTIL HFA;VENTOLIN HFA) 108 (90 Base) MCG/ACT inhaler Inhale 1 puff into the lungs every 6 (six) hours as needed for wheezing or shortness of breath.   Yes [provider]  aspirin 81 MG tablet Take 81 mg by mouth every other day.    Yes [provider]  atenolol (TENORMIN) 25 MG tablet Take 12.5 mg by mouth daily.    Yes [provider]  levothyroxine (SYNTHROID, LEVOTHROID) 25 MCG tablet Take 25 mcg by mouth daily before breakfast.   Yes [provider]  montelukast (SINGULAIR) 10 MG tablet Take 10 mg by mouth daily.    Yes [provider]  Multiple Vitamin (MULTIVITAMIN WITH MINERALS) TABS tablet Take 1 tablet by mouth daily.   Yes [provider]  pantoprazole (PROTONIX) 40 MG tablet Take 40 mg by mouth 2 (two) times daily.   Yes [provider]  lisinopril-hydrochlorothiazide (PRINZIDE,ZESTORETIC) 20-25 MG tablet Take 1 tablet by mouth daily.    [provider]  Vitamin D, Ergocalciferol, (DRISDOL) 50000 units CAPS capsule Take 50,000 Units by mouth every 7 (seven) days. Tuesday of each week    [provider]    Family History No family history on file.  Social History  Social History  Substance Use Topics  . Smoking status: Never Smoker  . Smokeless tobacco: Never Used  . Alcohol use No     Allergies   Fish allergy and Fish oil   Review of Systems Review of Systems  Constitutional: Positive for malaise/fatigue.  Cardiovascular: Positive for palpitations. Negative for chest pain, syncope and near-syncope.  Gastrointestinal:  Negative for abdominal pain.  Neurological: Negative for weakness.  All other systems reviewed and are negative.    Physical Exam Updated Vital Signs BP (!) 160/71 (BP Location: Right Arm)   Pulse 68   Temp 98.2 F (36.8 C) (Oral)   Resp 16   Ht 5\' 5"  (1.651 m)   Wt 165 lb (74.8 kg)   SpO2 100%   BMI 27.46 kg/m   Physical Exam  Constitutional: She is oriented to person, place, and time. She appears well-developed and well-nourished. No distress.  HENT:  Head: Normocephalic and atraumatic.  Eyes: EOM are normal.  Neck: Normal range of motion.  Cardiovascular: Normal rate, regular rhythm and normal heart sounds.   Pulmonary/Chest: Effort normal and breath sounds normal.  Abdominal: Soft. She exhibits no distension. There is no tenderness.  Musculoskeletal: Normal range of motion.  Neurological: She is alert and oriented to person, place, and time.  Skin: Skin is warm and dry.  Psychiatric: She has a normal mood and affect. Judgment normal.  Nursing note and vitals reviewed.    ED Treatments / Results  DIAGNOSTIC STUDIES: Oxygen Saturation is 100% on RA, NL by my interpretation.    COORDINATION OF CARE: 4:23 PM-Discussed next steps with pt. Pt verbalized understanding and is agreeable with the plan. Will order labs.   Labs (all labs ordered are listed, but only abnormal results are displayed) Labs Reviewed  URINALYSIS, ROUTINE W REFLEX MICROSCOPIC - Abnormal; Notable for the following:       Result Value   Specific Gravity, Urine 1.002 (*)    All other components within normal limits  CBC WITH DIFFERENTIAL/PLATELET - Abnormal; Notable for the following:    Hemoglobin 11.5 (*)    HCT 34.8 (*)    All other components within normal limits  BASIC METABOLIC PANEL - Abnormal; Notable for the following:    GFR calc non Af Amer 56 (*)    All other components within normal limits  TSH    EKG  EKG Interpretation  Date/Time:  Saturday Jun 01 2016 14:33:12  EDT Ventricular Rate:  71 PR Interval:  162 QRS Duration: 140 QT Interval:  404 QTC Calculation: 439 R Axis:   -59 Text Interpretation:  Normal sinus rhythm Right bundle branch block Left anterior fascicular block  Bifascicular block Abnormal ECG No significant change since last tracing Confirmed by Tamela Elsayed  MD, Jeannett Senior ([id number]) on 06/01/2016 3:24:33 PM       Radiology No results found.  Procedures Procedures (including critical care time)  Medications Ordered in ED Medications - No data to display   Initial Impression / Assessment and Plan / ED Course  I have reviewed the triage vital signs and the nursing notes.  Pertinent labs & imaging results that were available during my care of the patient were reviewed by me and considered in my medical decision making (see chart for details).     76yF with palpitations. No hypotension. Just minimal anemia. Lytes ok. It has been determined that no acute conditions requiring further emergency intervention are present at this time. The patient has been advised of the diagnosis and  plan. I reviewed any labs and imaging including any potential incidental findings. We have discussed signs and symptoms that warrant return to the ED and they are listed in the discharge instructions.    Final Clinical Impressions(s) / ED Diagnoses   Final diagnoses:  Heart palpitations    New Prescriptions New Prescriptions   No medications on file    I personally preformed the services scribed in my presence. The recorded information has been reviewed is accurate. Raeford Razor, MD.    Raeford Razor, MD 06/09/16 [phone]

## 2016-06-01 NOTE — ED Notes (Signed)
Pt states she feels better now. Pt reports she is taking atenolol for 8 days now.

## 2016-06-01 NOTE — ED Triage Notes (Addendum)
Was sitting in chair and felt dizzy and heart racing . Last episode of same x 2 months ago. Denies dizziness at present but fatigue and SOB. Started on Atenolol on 05/22/16

## 2016-06-01 NOTE — ED Notes (Signed)
Pt discharged to home with family. NAD.  

## 2016-06-02 LAB — TSH: TSH: 1.023 u[IU]/mL (ref 0.350–4.500)

## 2016-10-09 ENCOUNTER — Emergency Department (HOSPITAL_BASED_OUTPATIENT_CLINIC_OR_DEPARTMENT_OTHER): Payer: Medicare Other

## 2016-10-09 ENCOUNTER — Encounter (HOSPITAL_BASED_OUTPATIENT_CLINIC_OR_DEPARTMENT_OTHER): Payer: Self-pay | Admitting: Emergency Medicine

## 2016-10-09 ENCOUNTER — Emergency Department (HOSPITAL_BASED_OUTPATIENT_CLINIC_OR_DEPARTMENT_OTHER)
Admission: EM | Admit: 2016-10-09 | Discharge: 2016-10-09 | Disposition: A | Discharge status: Home / Self Care | Payer: Medicare Other | Attending: Emergency Medicine | Admitting: Emergency Medicine

## 2016-10-09 DIAGNOSIS — I10 Essential (primary) hypertension: Secondary | ICD-10-CM | POA: Diagnosis not present

## 2016-10-09 DIAGNOSIS — Z79899 Other long term (current) drug therapy: Secondary | ICD-10-CM | POA: Insufficient documentation

## 2016-10-09 DIAGNOSIS — J45909 Unspecified asthma, uncomplicated: Secondary | ICD-10-CM | POA: Insufficient documentation

## 2016-10-09 DIAGNOSIS — R5383 Other fatigue: Secondary | ICD-10-CM | POA: Diagnosis present

## 2016-10-09 DIAGNOSIS — R0602 Shortness of breath: Secondary | ICD-10-CM | POA: Insufficient documentation

## 2016-10-09 DIAGNOSIS — N3000 Acute cystitis without hematuria: Secondary | ICD-10-CM | POA: Diagnosis not present

## 2016-10-09 DIAGNOSIS — E039 Hypothyroidism, unspecified: Secondary | ICD-10-CM | POA: Insufficient documentation

## 2016-10-09 DIAGNOSIS — Z7982 Long term (current) use of aspirin: Secondary | ICD-10-CM | POA: Insufficient documentation

## 2016-10-09 LAB — BASIC METABOLIC PANEL
ANION GAP: 5 (ref 5–15)
BUN: 14 mg/dL (ref 6–20)
CALCIUM: 9.1 mg/dL (ref 8.9–10.3)
CO2: 29 mmol/L (ref 22–32)
Chloride: 108 mmol/L (ref 101–111)
Creatinine, Ser: 0.98 mg/dL (ref 0.44–1.00)
GFR calc Af Amer: >60 mL/min (ref >60)
GFR calc non Af Amer: 54 mL/min — ABNORMAL LOW (ref >60)
Glucose, Bld: 108 mg/dL — ABNORMAL HIGH (ref 65–99)
Potassium: 3.7 mmol/L (ref 3.5–5.1)
Sodium: 142 mmol/L (ref 135–145)

## 2016-10-09 LAB — URINALYSIS, ROUTINE W REFLEX MICROSCOPIC
Bilirubin Urine: NEGATIVE
GLUCOSE, UA: NEGATIVE mg/dL
HGB URINE DIPSTICK: NEGATIVE
Ketones, ur: NEGATIVE mg/dL
Leukocytes, UA: NEGATIVE
Nitrite: NEGATIVE
Protein, ur: NEGATIVE mg/dL
SPECIFIC GRAVITY, URINE: 1.01 (ref 1.005–1.030)
pH: 7.5 (ref 5.0–8.0)

## 2016-10-09 LAB — CBC
HCT: 37 % (ref 36.0–46.0)
Hemoglobin: 12.1 g/dL (ref 12.0–15.0)
MCH: 26.7 pg (ref 26.0–34.0)
MCHC: 32.7 g/dL (ref 30.0–36.0)
MCV: 81.7 fL (ref 78.0–100.0)
Platelets: 224 10*3/uL (ref 150–400)
RBC: 4.53 MIL/uL (ref 3.87–5.11)
RDW: 16 % — AB (ref 11.5–15.5)
WBC: 3.5 10*3/uL — AB (ref 4.0–10.5)

## 2016-10-09 LAB — TROPONIN I: Troponin I: <0.03 ng/mL (ref <0.03)

## 2016-10-09 MED ORDER — IOPAMIDOL (ISOVUE-370) INJECTION 76%
100.0000 mL | Freq: Once | INTRAVENOUS | Status: AC | PRN
  Administered 2016-10-09: 100 mL via INTRAVENOUS

## 2016-10-09 MED ORDER — FOSFOMYCIN TROMETHAMINE 3 G PO PACK
3.0000 g | PACK | Freq: Once | ORAL | Status: AC
  Administered 2016-10-09: 3 g via ORAL
  Filled 2016-10-09: qty 3

## 2016-10-09 NOTE — ED Notes (Signed)
ED Provider at bedside. 

## 2016-10-09 NOTE — ED Notes (Signed)
Pt verbalized understanding of discharge instructions and denies any further questions at this time.   

## 2016-10-09 NOTE — ED Notes (Signed)
Patient transported to X-ray 

## 2016-10-09 NOTE — Discharge Instructions (Signed)
Your weakness is likely due to urinary tract infection. We had treated you with a one-time dose of antibiotic which will completely treat this infection, and therefore you don't need to take the ciprofloxacin. 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

## 2016-10-09 NOTE — ED Provider Notes (Signed)
[facility] Provider Note   CSN: [id number] Arrival date & time: 10/09/16  0736     History   Chief Complaint Chief Complaint  Patient presents with  . Fatigue  . Shortness of Breath  . Urinary Tract Infection    HPI Gemini Bunte is a 76 y.o. female.  HPI   Patient with a past history of asthma and hypertension presenting with general fatigue, shortness of breath. She states that she was seen about 2 days ago for a urinary tract infection and sinusitis. She received Cipro, however it did not start this medication until last night. She is only taking 1 pill. States she has had increased frequency of urination and urgency for about 1 week. She denies any dysuria. She also indicates having some nasal congestion for about 1 week. She also states this morning she was short of breath this morning while sitting in bed, this lasted for a couple minutes and then resolved. Indicates she has had shortness of breath intermittently for many years and is associated with her history of asthma. No shortness of breath associated with activity. Denies being a smoker. Denies any significant cough. Denies any fevers, nausea, vomiting, chest pain, diaphoresis   Past Medical History:  Diagnosis Date  . Asthma   . GERD (gastroesophageal reflux disease)   . Hypertension   . Thyroid disease     Patient Active Problem List   Diagnosis Date Noted  . Pain in the chest 09/16/2015  . HTN (hypertension) 09/16/2015  . Hypothyroidism 09/16/2015  . Asthma 09/16/2015  . GERD (gastroesophageal reflux disease) 09/16/2015  . Hyperglycemia 09/16/2015  . Abnormal finding on chest xray 09/16/2015    History reviewed. No pertinent surgical history.  OB History    No data available       Home Medications    Prior to Admission medications   Medication Sig Start Date End Date Taking? Authorizing Provider  albuterol (PROVENTIL HFA;VENTOLIN HFA) 108 (90 Base) MCG/ACT inhaler Inhale 1 puff into  the lungs every 6 (six) hours as needed for wheezing or shortness of breath.   Yes [provider]  aspirin 81 MG tablet Take 81 mg by mouth every other day.    Yes [provider]  levothyroxine (SYNTHROID, LEVOTHROID) 25 MCG tablet Take 25 mcg by mouth daily before breakfast.   Yes [provider]  montelukast (SINGULAIR) 10 MG tablet Take 10 mg by mouth daily.    Yes [provider]  Multiple Vitamin (MULTIVITAMIN WITH MINERALS) TABS tablet Take 1 tablet by mouth daily.   Yes [provider]  pantoprazole (PROTONIX) 40 MG tablet Take 40 mg by mouth 2 (two) times daily.   Yes [provider]  Vitamin D, Ergocalciferol, (DRISDOL) 50000 units CAPS capsule Take 50,000 Units by mouth every 7 (seven) days. Tuesday of each week   Yes [provider]  atenolol (TENORMIN) 25 MG tablet Take 12.5 mg by mouth daily.     [provider]  lisinopril-hydrochlorothiazide (PRINZIDE,ZESTORETIC) 20-25 MG tablet Take 1 tablet by mouth daily.    [provider]    Family History No family history on file.  Social History Social History  Substance Use Topics  . Smoking status: Never Smoker  . Smokeless tobacco: Never Used  . Alcohol use No     Allergies   Fish allergy and Fish oil   Review of Systems Review of Systems  Constitutional: Negative for chills and fever.  HENT: Positive for congestion and  postnasal drip.   Respiratory: Positive for shortness of breath. Negative for cough and wheezing.   Cardiovascular: Negative for chest pain and leg swelling.  Gastrointestinal: Negative for abdominal pain, nausea and vomiting.  Genitourinary: Positive for frequency and urgency. Negative for dysuria.  Skin: Negative for rash.     Physical Exam Updated Vital Signs BP 111/75   Pulse 71   Temp 97.9 F (36.6 C) (Oral)   Resp (!) 22   Ht  (1.651 m)   Wt 70.3 kg (155 lb)   SpO2 97%   BMI 25.79 kg/m   Physical  Exam  Constitutional: She is oriented to person, place, and time. She appears well-developed and well-nourished.  HENT:  Head: Normocephalic.  Right Ear: External ear normal.  Left Ear: External ear normal.  Mouth/Throat: Oropharynx is clear and moist.  Eyes: Pupils are equal, round, and reactive to light. Conjunctivae are normal.  Neck: Normal range of motion.  Cardiovascular: Normal rate, regular rhythm, normal heart sounds and intact distal pulses.   Pulmonary/Chest: Effort normal and breath sounds normal.  Abdominal: Soft. Bowel sounds are normal. She exhibits no distension. There is no tenderness.  Musculoskeletal: Normal range of motion.  Neurological: She is alert and oriented to person, place, and time.  Skin: Skin is warm. Capillary refill takes less than 2 seconds.  Psychiatric: She has a normal mood and affect.     ED Treatments / Results  Labs (all labs ordered are listed, but only abnormal results are displayed) Labs Reviewed  CBC - Abnormal; Notable for the following:       Result Value   WBC 3.5 (*)    RDW 16.0 (*)    All other components within normal limits  BASIC METABOLIC PANEL - Abnormal; Notable for the following:    Glucose, Bld 108 (*)    GFR calc non Af Amer 54 (*)    All other components within normal limits  URINALYSIS, ROUTINE W REFLEX MICROSCOPIC  TROPONIN I    EKG  EKG Interpretation  Date/Time:  Wednesday October 09 2016 08:30:27 EDT Ventricular Rate:  69 PR Interval:    QRS Duration: 147 QT Interval:  439 QTC Calculation: 471 R Axis:   -43 Text Interpretation:  duplicate please delete Confirmed by Melene Plan [phone]) on 10/09/2016 8:42:43 AM       Radiology Dg Chest 2 View  Result Date: 10/09/2016 CLINICAL DATA:  76 year old female with a history of shortness of breath EXAM: CHEST  2 VIEW COMPARISON:  04/16/2016, no prior CT imaging, most remote chest x-ray 04/30/2013 FINDINGS: Cardiomediastinal silhouette unchanged in size and  contour with cardiomegaly. No evidence of central vascular congestion. No pneumothorax or pleural effusion. Stigmata of emphysema, with increased retrosternal airspace, flattened hemidiaphragms, increased AP diameter, and hyperinflation on the AP view. Lateral view demonstrates rounded density at the posterior base, which appears separate from the bilateral hemidiaphragm. Degenerative changes of the spine.  No displaced fracture. IMPRESSION: Emphysema with no radiographic evidence of acute cardiopulmonary disease. Lateral view demonstrates rounded opacity at the posterior lung base, which appears separate from the diaphragm, concerning for mass. Further evaluation with non-contrast chest CT is recommended. These results were called by telephone at the time of interpretation on 10/09/2016 at 8:32 am to Dr. Cathlean Cower, who verbally acknowledged these results. Electronically Signed   By: Gilmer Mor D.O.   On: 10/09/2016 08:34   Ct Angio Chest Pe W And/or Wo Contrast  Result Date: 10/09/2016 CLINICAL DATA:  Shortness  of Breath EXAM: CT ANGIOGRAPHY CHEST WITH CONTRAST TECHNIQUE: Multidetector CT imaging of the chest was performed using the standard protocol during bolus administration of intravenous contrast. Multiplanar CT image reconstructions and MIPs were obtained to evaluate the vascular anatomy. CONTRAST:  100 mL Isovue 370 nonionic COMPARISON:  Chest radiograph October 09, 2016 FINDINGS: Cardiovascular: There is no demonstrable pulmonary embolus. Main pulmonary outflow tract measures 3.6 cm, a finding felt to be indicative of a degree of pulmonary arterial hypertension. There is no appreciable thoracic aortic aneurysm or dissection. The visualized great vessels appear unremarkable. Note that the right and left common carotid arteries arise as a common trunk, an anatomic variant. There are foci of atherosclerotic calcification in the aorta. Pericardium is not appreciably thickened. Mediastinum/Nodes: Thyroid  appears unremarkable. There is no appreciable thoracic adenopathy. No esophageal lesions are appreciable. Lungs/Pleura: There is mild scarring in the left base. There is no edema or consolidation. No pleural effusion or pleural thickening. No pulmonary mass evident. 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 Upper Abdomen: Visualized upper abdominal structures appear unremarkable. 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 Musculoskeletal: There is degenerative change in the thoracic spine. There is thoracic dextroscoliosis. There are no evident blastic or lytic bone lesions. Review of the MIP images confirms the above findings. IMPRESSION: 1.  No demonstrable pulmonary embolus. 2. Prominence of the main pulmonary outflow tract, a finding indicative of pulmonary arterial hypertension. 3. Areas of aortic atherosclerosis. No demonstrable thoracic aortic aneurysm or dissection. 4. 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 No mass in this area. The area questioned on recent chest radiograph is due to the foramen of Bochdalek hernia. 5.  Mild scarring left lung base.  No edema or consolidation. 6.  No evident adenopathy. Aortic Atherosclerosis (ICD10-I70.0). Electronically Signed   By: Bretta BangWilliam  Woodruff III M.D.   On: 10/09/2016 09:41    Procedures Procedures (including critical care time)  Medications Ordered in ED Medications  iopamidol (ISOVUE-370) 76 % injection 100 mL (100 mLs Intravenous Contrast Given 10/09/16 0922)  fosfomycin (MONUROL) packet 3 g (3 g Oral Given 10/09/16 0954)     Initial Impression / Assessment and Plan / ED Course  I have reviewed the triage vital signs and the nursing notes.  Pertinent labs & imaging results that were available during my care of the patient were reviewed by me and considered in my medical decision making (see chart for details).   Patient  presenting with weakness, fatigue, and SOB. She's had some increased frequency in urination, was seen 2 days ago with a positive UA containing nitrates. Treated with Fosfomycin in the ED. Patient lab work was within normal limits today. Chest x-ray was negative. EKG was obtained and initial troponin was negative. CT chest was obtained due to presence of possible mass and rule out PE. Patient was negative for PE. 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 Patient to follow up with PCP, discussed possibly surgical consult.   Final Clinical Impressions(s) / ED Diagnoses   Final diagnoses:  Acute cystitis without hematuria    New Prescriptions New Prescriptions   No medications on file     Berton BonMikell, Azaan Leask Zahra, MD 10/09/16 1014    Melene PlanFloyd, Dan, DO 10/09/16 1533

## 2016-10-09 NOTE — ED Triage Notes (Signed)
Pt reports she's fatigued, has frequent urination and gets short winded. This has been going on since last week. Denies LOC or Fevers. Alert oriented speaking in clear full sentences.

## 2016-12-21 ENCOUNTER — Other Ambulatory Visit: Payer: Self-pay

## 2016-12-21 ENCOUNTER — Encounter (HOSPITAL_BASED_OUTPATIENT_CLINIC_OR_DEPARTMENT_OTHER): Payer: Self-pay | Admitting: Emergency Medicine

## 2016-12-21 ENCOUNTER — Emergency Department (HOSPITAL_BASED_OUTPATIENT_CLINIC_OR_DEPARTMENT_OTHER)
Admission: EM | Admit: 2016-12-21 | Discharge: 2016-12-21 | Disposition: A | Discharge status: Home / Self Care | Payer: Medicare Other | Attending: Emergency Medicine | Admitting: Emergency Medicine

## 2016-12-21 ENCOUNTER — Emergency Department (HOSPITAL_BASED_OUTPATIENT_CLINIC_OR_DEPARTMENT_OTHER): Payer: Medicare Other

## 2016-12-21 DIAGNOSIS — J45909 Unspecified asthma, uncomplicated: Secondary | ICD-10-CM | POA: Diagnosis not present

## 2016-12-21 DIAGNOSIS — R0602 Shortness of breath: Secondary | ICD-10-CM | POA: Diagnosis present

## 2016-12-21 DIAGNOSIS — Z7982 Long term (current) use of aspirin: Secondary | ICD-10-CM | POA: Insufficient documentation

## 2016-12-21 DIAGNOSIS — I10 Essential (primary) hypertension: Secondary | ICD-10-CM | POA: Diagnosis not present

## 2016-12-21 DIAGNOSIS — E039 Hypothyroidism, unspecified: Secondary | ICD-10-CM | POA: Diagnosis not present

## 2016-12-21 DIAGNOSIS — Z79899 Other long term (current) drug therapy: Secondary | ICD-10-CM | POA: Diagnosis not present

## 2016-12-21 LAB — TROPONIN I

## 2016-12-21 LAB — BRAIN NATRIURETIC PEPTIDE: B NATRIURETIC PEPTIDE 5: 107.6 pg/mL — AB (ref 0.0–100.0)

## 2016-12-21 LAB — CBC WITH DIFFERENTIAL/PLATELET
BASOS ABS: 0 10*3/uL (ref 0.0–0.1)
BASOS PCT: 1 %
EOS ABS: 0 10*3/uL (ref 0.0–0.7)
Eosinophils Relative: 1 %
HCT: 35.2 % — ABNORMAL LOW (ref 36.0–46.0)
HEMOGLOBIN: 11.6 g/dL — AB (ref 12.0–15.0)
Lymphocytes Relative: 26 %
Lymphs Abs: 1 10*3/uL (ref 0.7–4.0)
MCH: 26.9 pg (ref 26.0–34.0)
MCHC: 33 g/dL (ref 30.0–36.0)
MCV: 81.7 fL (ref 78.0–100.0)
MONOS PCT: 13 %
Monocytes Absolute: 0.5 10*3/uL (ref 0.1–1.0)
NEUTROS PCT: 59 %
Neutro Abs: 2.3 10*3/uL (ref 1.7–7.7)
Platelets: 216 10*3/uL (ref 150–400)
RBC: 4.31 MIL/uL (ref 3.87–5.11)
RDW: 14.9 % (ref 11.5–15.5)
WBC: 3.9 10*3/uL — AB (ref 4.0–10.5)

## 2016-12-21 LAB — BASIC METABOLIC PANEL
ANION GAP: 6 (ref 5–15)
BUN: 12 mg/dL (ref 6–20)
CALCIUM: 9 mg/dL (ref 8.9–10.3)
CO2: 26 mmol/L (ref 22–32)
Chloride: 107 mmol/L (ref 101–111)
Creatinine, Ser: 0.96 mg/dL (ref 0.44–1.00)
GFR, EST NON AFRICAN AMERICAN: 55 mL/min — AB (ref >60)
GLUCOSE: 131 mg/dL — AB (ref 65–99)
POTASSIUM: 3.6 mmol/L (ref 3.5–5.1)
SODIUM: 139 mmol/L (ref 135–145)

## 2016-12-21 MED ORDER — ALBUTEROL SULFATE (5 MG/ML) 0.5% IN NEBU: 2.5000 mg | INHALATION_SOLUTION | Freq: Four times a day (QID) | RESPIRATORY_TRACT | 12 refills | Status: DC | PRN

## 2016-12-21 MED ORDER — ALBUTEROL SULFATE HFA 108 (90 BASE) MCG/ACT IN AERS
2.0000 | INHALATION_SPRAY | RESPIRATORY_TRACT | Status: DC | PRN
  Administered 2016-12-21: 2 via RESPIRATORY_TRACT
  Filled 2016-12-21: qty 6.7

## 2016-12-21 NOTE — ED Notes (Signed)
Pt laid flat for orthostatic VS

## 2016-12-21 NOTE — Discharge Instructions (Signed)
Call your doctor for follow-up to discuss potentially changing the most recent blood pressure medication as it seems that you are not tolerating it very well.  Also try using Ocean nasal spray for your congestion.  If that does not seem to be working out you can also try taking Zyrtec or Claritin over-the-counter.

## 2016-12-21 NOTE — ED Provider Notes (Signed)
[facility] EMERGENCY DEPARTMENT Provider Note   CSN: [id number] Arrival date & time: 12/21/16  7829     History   Chief Complaint Chief Complaint  Patient presents with  . Shortness of Breath    HPI Dominique Ochoa is a 76 y.o. female.  Patient is a 76 year old female with a history of hypertension, asthma, GERD, thyroid disease, hyperglycemia presenting today with worsening intermittent shortness of breath.  Patient states for the last 3-4 days she has noticed an intermittent times of being short of breath.  She notices it sometimes with walking but not all the time but is also been awakened at night for the last few nights feeling short of breath.  She has noticed some swelling in her legs over the last few days but no unilateral swelling or leg pain.  She denies any chest discomfort or chest pain.  She has had some mild congestion but denies any cough or URI symptoms.  She has noted over the last month since starting a new blood pressure medication that she feels dizzy with standing that usually improves with sitting down.  She states when she went to the office a month ago her blood pressure was in the 190s and was started on this blood pressure medication and now her pressures are running between 130 and 140 at home.  She denies any abdominal pain, nausea or vomiting.  She is eating and drinking at her baseline.   The history is provided by the patient.  Shortness of Breath  This is a new problem. Duration: 3-4 days. The problem occurs intermittently.The problem has been gradually worsening. Associated symptoms include rhinorrhea and leg swelling. Pertinent negatives include no fever, no headaches, no neck pain, no cough, no sputum production, no wheezing, no chest pain, no vomiting, no abdominal pain and no leg pain. It is unknown what precipitated the problem. She has tried nothing for the symptoms. She has had no prior ICU admissions. Associated medical issues include asthma.  Associated medical issues do not include COPD, PE, CAD or heart failure.    Past Medical History:  Diagnosis Date  . Asthma   . GERD (gastroesophageal reflux disease)   . Hypertension   . Thyroid disease     Patient Active Problem List   Diagnosis Date Noted  . Pain in the chest 09/16/2015  . HTN (hypertension) 09/16/2015  . Hypothyroidism 09/16/2015  . Asthma 09/16/2015  . GERD (gastroesophageal reflux disease) 09/16/2015  . Hyperglycemia 09/16/2015  . Abnormal finding on chest xray 09/16/2015    History reviewed. No pertinent surgical history.  OB History    No data available       Home Medications    Prior to Admission medications   Medication Sig Start Date End Date Taking? Authorizing Provider  albuterol (PROVENTIL HFA;VENTOLIN HFA) 108 (90 Base) MCG/ACT inhaler Inhale 1 puff into the lungs every 6 (six) hours as needed for wheezing or shortness of breath.   Yes [provider]  amLODipine (NORVASC) 5 MG tablet Take 5 mg by mouth daily.   Yes [provider]  aspirin 81 MG tablet Take 81 mg by mouth every other day.    Yes [provider]  levothyroxine (SYNTHROID, LEVOTHROID) 25 MCG tablet Take 25 mcg by mouth daily before breakfast.   Yes [provider]  montelukast (SINGULAIR) 10 MG tablet Take 10 mg by mouth daily.    Yes [provider]  pantoprazole (PROTONIX) 40 MG tablet Take 40 mg by  mouth 2 (two) times daily.   Yes [provider]  Vitamin D, Ergocalciferol, (DRISDOL) 50000 units CAPS capsule Take 50,000 Units by mouth every 7 (seven) days. Tuesday of each week   Yes [provider]  atenolol (TENORMIN) 25 MG tablet Take 12.5 mg by mouth daily.     [provider]  lisinopril-hydrochlorothiazide (PRINZIDE,ZESTORETIC) 20-25 MG tablet Take 1 tablet by mouth daily.    [provider]  Multiple Vitamin (MULTIVITAMIN WITH MINERALS) TABS tablet Take 1 tablet by mouth daily.     [provider]    Family History No family history on file.  Social History Social History   Tobacco Use  . Smoking status: Never Smoker  . Smokeless tobacco: Never Used  Substance Use Topics  . Alcohol use: No  . Drug use: No     Allergies   Fish allergy and Fish oil   Review of Systems Review of Systems  Constitutional: Negative for fever.  HENT: Positive for rhinorrhea.   Respiratory: Positive for shortness of breath. Negative for cough, sputum production and wheezing.   Cardiovascular: Positive for leg swelling. Negative for chest pain.  Gastrointestinal: Negative for abdominal pain and vomiting.  Musculoskeletal: Negative for neck pain.  Neurological: Negative for headaches.  All other systems reviewed and are negative.    Physical Exam Updated Vital Signs BP (!) 159/83 (BP Location: Right Arm)   Pulse 92   Temp 98.8 F (37.1 C) (Oral)   Resp 16   Ht 5\' 6"  (1.676 m)   Wt 68.5 kg (151 lb)   SpO2 100%   BMI 24.37 kg/m   Physical Exam  Constitutional: She is oriented to person, place, and time. She appears well-developed and well-nourished. No distress.  HENT:  Head: Normocephalic and atraumatic.  Mouth/Throat: Oropharynx is clear and moist.  Eyes: Conjunctivae and EOM are normal. Pupils are equal, round, and reactive to light.  Neck: Normal range of motion. Neck supple. JVD present.  Minimal JVD  Cardiovascular: Normal rate, regular rhythm and intact distal pulses.  No murmur heard. Pulmonary/Chest: Effort normal and breath sounds normal. No respiratory distress. She has no wheezes. She has no rales.  Abdominal: Soft. She exhibits no distension. There is no tenderness. There is no rebound and no guarding.  Musculoskeletal: Normal range of motion. She exhibits no tenderness.       Right lower leg: She exhibits edema.       Left lower leg: She exhibits edema.  Trace edema bilateral lower extremities  Neurological: She is alert and oriented to  person, place, and time.  Skin: Skin is warm and dry. No rash noted. No erythema.  Psychiatric: She has a normal mood and affect. Her behavior is normal.  Nursing note and vitals reviewed.    ED Treatments / Results  Labs (all labs ordered are listed, but only abnormal results are displayed) Labs Reviewed  BASIC METABOLIC PANEL - Abnormal; Notable for the following components:      Result Value   Glucose, Bld 131 (*)    GFR calc non Af Amer 55 (*)    All other components within normal limits  BRAIN NATRIURETIC PEPTIDE - Abnormal; Notable for the following components:   B Natriuretic Peptide 107.6 (*)    All other components within normal limits  CBC WITH DIFFERENTIAL/PLATELET - Abnormal; Notable for the following components:   WBC 3.9 (*)    Hemoglobin 11.6 (*)    HCT 35.2 (*)    All  other components within normal limits  TROPONIN I    EKG  EKG Interpretation  Date/Time:  Saturday December 21 2016 09:40:08 EST Ventricular Rate:  84 PR Interval:    QRS Duration: 144 QT Interval:  417 QTC Calculation: 493 R Axis:   -61 Text Interpretation:  Sinus rhythm Ventricular premature complex Consider left atrial enlargement RBBB and LAFB Probable left ventricular hypertrophy Baseline wander in lead(s) V3 No significant change since last tracing Confirmed by Gwyneth Sprout ([id number]) on 12/21/2016 9:57:54 AM       Radiology Dg Chest 2 View  Result Date: 12/21/2016 CLINICAL DATA:  76 year old female with a history of shortness of breath EXAM: CHEST  2 VIEW COMPARISON:  10/09/2016, 04/16/2016, CT 10/09/2016 FINDINGS: Cardiomediastinal silhouette unchanged in size and contour. Calcifications of the aortic arch. No evidence of central vascular congestion. No interlobular septal thickening. Similar appearance of coarsened interstitial markings of the bilateral lungs. No pneumothorax or pleural effusion. Stigmata of emphysema, with increased retrosternal airspace, flattened  hemidiaphragms, increased AP diameter, and hyperinflation on the AP view. Double density at the posterior lung base on the lateral view compatible with small fat herniation the medial left diaphragm. No displaced fracture. Degenerative changes of the visualized spine. IMPRESSION: Emphysema and chronic lung changes without evidence of acute cardiopulmonary disease Electronically Signed   By: Gilmer Mor D.O.   On: 12/21/2016 10:13    Procedures Procedures (including critical care time)  Medications Ordered in ED Medications - No data to display   Initial Impression / Assessment and Plan / ED Course  I have reviewed the triage vital signs and the nursing notes.  Pertinent labs & imaging results that were available during my care of the patient were reviewed by me and considered in my medical decision making (see chart for details).    Patient is an elderly female presenting today with intermittent shortness of breath, orthopnea and dizziness with standing.  She denies any chest pain or symptoms concerning for MI at this time however concern for possible volume overload.  She is denying any symptoms suggestive of infection such as URI symptoms and despite her history of asthma she has no wheezing is not noted any wheezing at home.  She is in no acute distress at this time and is satting 100% on room air.  She is currently on 4 different heart/blood pressure medications including amlodipine, atenolol, combination lisinopril hydrochlorothiazide.  Patient is not hypotensive here.  Low suspicion for PE/dissection at this time based on patient's complaints.  CBC, BMP, BNP, troponin, chest x-ray pending.  EKG is unchanged from prior.  11:43 AM  Labs are reassuring without evidence of anemia, acute kidney disease or MI.  Chest x-ray without acute findings but does have evidence of emphysema.  Patient's statics are within normal limits.  Discussed with patient talking with her doctor as it does not seem  like she is tolerating the new blood pressure medication that she recently started which was amlodipine.  Also with the mild swelling in her legs that could be a result of the medication.  Patient's family is present and findings were discussed with them.  She will try Ocean nasal spray for her nasal congestion that she has not tolerated steroid sprays in the past.  She will also start an allergy medication if that does not seem to help.  She is in no acute distress at this time and feel it is reasonable to allow patient to go home.   Final Clinical Impressions(s) /  ED Diagnoses   Final diagnoses:  SOB (shortness of breath)    ED Discharge Orders        Ordered    albuterol (PROVENTIL) (5 MG/ML) 0.5% nebulizer solution  Every 6 hours PRN     12/21/16 1142       Gwyneth SproutPlunkett, Aryaan Persichetti, MD 12/21/16 1145

## 2016-12-21 NOTE — ED Triage Notes (Signed)
SOB x 2 days with dizziness, dry cough at times, denies pain or fever

## 2017-01-24 DIAGNOSIS — G4733 Obstructive sleep apnea (adult) (pediatric): Secondary | ICD-10-CM | POA: Insufficient documentation

## 2017-01-24 HISTORY — DX: Obstructive sleep apnea (adult) (pediatric): G47.33

## 2017-04-12 ENCOUNTER — Emergency Department (HOSPITAL_BASED_OUTPATIENT_CLINIC_OR_DEPARTMENT_OTHER)
Admission: EM | Admit: 2017-04-12 | Discharge: 2017-04-12 | Disposition: A | Discharge status: Home / Self Care | Payer: Medicare Other | Attending: Emergency Medicine | Admitting: Emergency Medicine

## 2017-04-12 ENCOUNTER — Other Ambulatory Visit: Payer: Self-pay

## 2017-04-12 ENCOUNTER — Emergency Department (HOSPITAL_BASED_OUTPATIENT_CLINIC_OR_DEPARTMENT_OTHER): Payer: Medicare Other

## 2017-04-12 ENCOUNTER — Encounter (HOSPITAL_BASED_OUTPATIENT_CLINIC_OR_DEPARTMENT_OTHER): Payer: Self-pay | Admitting: Emergency Medicine

## 2017-04-12 DIAGNOSIS — Z23 Encounter for immunization: Secondary | ICD-10-CM | POA: Diagnosis not present

## 2017-04-12 DIAGNOSIS — M79674 Pain in right toe(s): Secondary | ICD-10-CM | POA: Diagnosis not present

## 2017-04-12 MED ORDER — TETANUS-DIPHTH-ACELL PERTUSSIS 5-2.5-18.5 LF-MCG/0.5 IM SUSP
0.5000 mL | Freq: Once | INTRAMUSCULAR | Status: AC
  Administered 2017-04-12: 0.5 mL via INTRAMUSCULAR
  Filled 2017-04-12: qty 0.5

## 2017-04-12 MED ORDER — CEPHALEXIN 500 MG PO CAPS: 500.0000 mg | ORAL_CAPSULE | Freq: Four times a day (QID) | ORAL | 0 refills | Status: AC

## 2017-04-12 NOTE — Discharge Instructions (Signed)
You may have diarrhea from the antibiotics.  It is very important that you continue to take the antibiotics even if you get diarrhea unless a medical professional tells you that you may stop taking them.  If you stop too early the bacteria you are being treated for will become stronger and you may need different, more powerful antibiotics that have more side effects and worsening diarrhea.  Please stay well hydrated and consider probiotics as they may decrease the severity of your diarrhea.    ° °Please take Tylenol (acetaminophen) to relieve your pain.  You may take tylenol, up to 1,000 mg (two extra strength pills).  Do not take more than 3,000 mg tylenol in a 24 hour period.  Please check all medication labels as many medications such as pain and cold medications may contain tylenol. Please do not drink alcohol while taking this medication.  ° °

## 2017-04-12 NOTE — ED Triage Notes (Addendum)
R foot injury, accidentally kicked a pole at church. Pt states toenail to great toe is coming off. Pt denies pain.

## 2017-04-12 NOTE — ED Provider Notes (Signed)
[facility] EMERGENCY DEPARTMENT Provider Note   CSN: [id number] Arrival date & time: 04/12/17  1611     History   Chief Complaint Chief Complaint  Patient presents with  . Foot Injury    HPI Dominique Ochoa is a 77 y.o. female who presents today for evaluation of right great toe pain.  She reports that she was at church hanging things on a pole when she accidentally kicked the pole.  She reports that "it is not hurting too bad now."  She reports that it was bleeding around the base of the nail on her right great toe.  She reports that she is unsure when her last tetanus shot was.  She has not taken anything for her symptoms prior to arrival.  She reports that she is concerned it may be broken.  She did not fall, denies any other pains injuries or new symptoms.  HPI  Past Medical History:  Diagnosis Date  . Asthma   . GERD (gastroesophageal reflux disease)   . Hypertension   . Thyroid disease     Patient Active Problem List   Diagnosis Date Noted  . Pain in the chest 09/16/2015  . HTN (hypertension) 09/16/2015  . Hypothyroidism 09/16/2015  . Asthma 09/16/2015  . GERD (gastroesophageal reflux disease) 09/16/2015  . Hyperglycemia 09/16/2015  . Abnormal finding on chest xray 09/16/2015    History reviewed. No pertinent surgical history.  OB History    No data available       Home Medications    Prior to Admission medications   Medication Sig Start Date End Date Taking? Authorizing Provider  aspirin 81 MG tablet Take 81 mg by mouth every other day.    Yes [provider]  atenolol (TENORMIN) 25 MG tablet Take 12.5 mg by mouth daily.    Yes [provider]  levothyroxine (SYNTHROID, LEVOTHROID) 25 MCG tablet Take 25 mcg by mouth daily before breakfast.   Yes [provider]  lisinopril-hydrochlorothiazide (PRINZIDE,ZESTORETIC) 20-25 MG tablet Take 1 tablet by mouth daily.   Yes [provider]  pantoprazole (PROTONIX) 40 MG  tablet Take 40 mg by mouth 2 (two) times daily.   Yes [provider]  albuterol (PROVENTIL HFA;VENTOLIN HFA) 108 (90 Base) MCG/ACT inhaler Inhale 1 puff into the lungs every 6 (six) hours as needed for wheezing or shortness of breath.    [provider]  albuterol (PROVENTIL) (5 MG/ML) 0.5% nebulizer solution Take 0.5 mLs (2.5 mg total) by nebulization every 6 (six) hours as needed for wheezing or shortness of breath. 12/21/16   Gwyneth Sprout, MD  amLODipine (NORVASC) 5 MG tablet Take 5 mg by mouth daily.    [provider]  cephALEXin (KEFLEX) 500 MG capsule Take 1 capsule (500 mg total) by mouth 4 (four) times daily for 10 days. 04/12/17 04/22/17  Cristina Gong, PA-C  montelukast (SINGULAIR) 10 MG tablet Take 10 mg by mouth daily.     [provider]  Multiple Vitamin (MULTIVITAMIN WITH MINERALS) TABS tablet Take 1 tablet by mouth daily.    [provider]  Vitamin D, Ergocalciferol, (DRISDOL) 50000 units CAPS capsule Take 50,000 Units by mouth every 7 (seven) days. Tuesday of each week    [provider]    Family History No family history on file.  Social History Social History   Tobacco Use  . Smoking status: Never Smoker  . Smokeless tobacco: Never Used  Substance Use Topics  . Alcohol use: No  .  Drug use: No     Allergies   Fish allergy and Fish oil   Review of Systems Review of Systems  Constitutional: Negative for chills and fever.  Musculoskeletal:       Pain in right great toe.  Skin: Positive for wound.  Neurological: Negative for weakness, numbness and headaches.  All other systems reviewed and are negative.    Physical Exam Updated Vital Signs BP (!) 158/84 (BP Location: Right Arm)   Pulse 90   Temp 98.1 F (36.7 C) (Oral)   Resp 18   Ht 5\' 7"  (1.702 m)   Wt 68.5 kg (151 lb)   SpO2 100%   BMI 23.65 kg/m   Physical Exam  Constitutional: She is oriented to person, place, and time. She  appears well-developed and well-nourished.  HENT:  Head: Normocephalic and atraumatic.  Mouth/Throat: Oropharynx is clear and moist.  Cardiovascular: Intact distal pulses.  2+ DP/PT pulses bilaterally.  Musculoskeletal:  Feet have chronic bunions present.  There is no deformity, TTP or crepitis over right great toe.  No TTP proximally on right foot.    Neurological: She is alert and oriented to person, place, and time. No sensory deficit.  Skin: Skin is warm and dry. She is not diaphoretic.  There is a small area of dried blood along the medial aspect of the proximal nail fold on the right great toe.  The toe nail appears displaced proximally.  Her nails are thickened and dystrophic, however there does not appear to be subungual hematoma present.  The nail is still firmly attached to the nailbed.    Psychiatric: She has a normal mood and affect. Her behavior is normal.  Nursing note and vitals reviewed.    ED Treatments / Results  Labs (all labs ordered are listed, but only abnormal results are displayed) Labs Reviewed - No data to display  EKG  EKG Interpretation None       Radiology Dg Foot Complete Right  Result Date: 04/12/2017 CLINICAL DATA:  Hit foot against solid object EXAM: RIGHT FOOT COMPLETE - 3+ VIEW COMPARISON:  None. FINDINGS: Frontal, oblique, and lateral views obtained. No evident fracture or dislocation. There appears to be injury to the ungual region of the first digit. 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 There is mild narrowing of all DIP joints. Other joint spaces appear unremarkable. No erosive change. IMPRESSION: No bony fracture or dislocation. Apparent ungual injury first digit. 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 Milder narrowing all DIP joints.  Electronically Signed   By: Bretta Bang III M.D.   On: 04/12/2017 18:18    Procedures Procedures (including critical care time)  Medications Ordered in ED Medications  Tdap (BOOSTRIX) injection 0.5 mL (0.5 mLs Intramuscular Given 04/12/17 1900)     Initial Impression / Assessment and Plan / ED Course  I have reviewed the triage vital signs and the nursing notes.  Pertinent labs & imaging results that were available during my care of the patient were reviewed by me and considered in my medical decision making (see chart for details).    Patient presents today for evaluation after she stubbed her right great toe as an isolated injury.  X-rays were obtained suspicious for a right great toe fungal injury, without other acute abnormalities, multiple chronic changes seen.  Evaluation the  nail is firmly seated and attached to the nailbed, despite the proximal nail fold appearing injured.  Wound is hemostatic at this time.  There does not appear to be a subungual hematoma, however patient has dystrophic and thickened nails making this difficult to assess.  She does not have pain, no obvious signs of infection.  Based on her age, and having an open wound she will be prophylactically given Keflex to help prevent infection.  Her Tdap was updated.  She was instructed on over-the-counter pain management.  She was instructed to call her PCP on Monday for an appointment and to return to the ER sooner if signs and symptoms of infection develop or she has any other concerns.  Please note that there is an FYI stating that patient has a legal guardian, she is alert and oriented to person place and time, says that she does not have a legal guardian.   Final Clinical Impressions(s) / ED Diagnoses   Final diagnoses:  Pain of toe of right foot    ED Discharge Orders        Ordered    cephALEXin (KEFLEX) 500 MG capsule  4 times daily     04/12/17 1859       Norman ClayHammond, Elizabeth W, PA-C 04/12/17  1925    Tilden Fossaees, Elizabeth, MD 04/14/17 1249

## 2017-04-17 ENCOUNTER — Ambulatory Visit: Payer: Medicare Other | Admitting: Podiatry

## 2017-04-17 ENCOUNTER — Encounter: Payer: Self-pay | Admitting: Podiatry

## 2017-04-17 VITALS — BP 142/88 | HR 76 | Ht 65.0 in | Wt 149.0 lb

## 2017-04-17 DIAGNOSIS — B351 Tinea unguium: Secondary | ICD-10-CM | POA: Diagnosis not present

## 2017-04-17 DIAGNOSIS — M79672 Pain in left foot: Secondary | ICD-10-CM

## 2017-04-17 DIAGNOSIS — M79671 Pain in right foot: Secondary | ICD-10-CM

## 2017-04-17 DIAGNOSIS — S99921A Unspecified injury of right foot, initial encounter: Secondary | ICD-10-CM | POA: Diagnosis not present

## 2017-04-17 NOTE — Progress Notes (Signed)
SUBJECTIVE: 77 y.o. year old female presents for painful feet and for follow up on injured right great toe.  Stated that she stumped right great toe 04/12/17. Been to ER and confirmed of no fracture and was prescribed with antibiotics. Patient is here to have nail checked out.    Review of Systems  Constitutional: Negative.   HENT: Negative.   Eyes: Negative.   Respiratory:       History of Asthma for 40 years.  Cardiovascular: Negative.   Gastrointestinal: Negative.   Genitourinary: Negative.   Musculoskeletal: Negative.   Skin: Negative.     OBJECTIVE: DERMATOLOGIC EXAMINATION: Thick dystrophic nails x 10. Detatched distal 3/4 of nail with dry blood at proximal skin folder right great toe. Ingrown left great toe medial border. No associated edema or erythema noted.  VASCULAR EXAMINATION OF LOWER LIMBS: All pedal pulses are palpable with normal pulsation.  Capillary Filling times within 3 seconds in all digits.  No edema or erythema noted. Temperature gradient from tibial crest to dorsum of foot is within normal bilateral.  NEUROLOGIC EXAMINATION OF THE LOWER LIMBS: All epicritic and tactile sensations grossly intact. Sharp and Dull discriminatory sensations at the plantar ball of hallux is intact bilateral.   MUSCULOSKELETAL EXAMINATION: No gross deformities noted.  ASSESSMENT: Injury right great toe. Loose nail right hallux with dry nail bed. Ingrown left great toe. Onychomycosis x 10.  PLAN: Reviewed findings and available treatment options. All nails debrided. Left great toe cleansed with Iodine and dressing applied. Injured hallucal nail removed. Proximal 1/4 of attached plate left intact.

## 2017-04-17 NOTE — Patient Instructions (Signed)
Seen for injured right great toe. Noted of loose right great toe nail and ingrown left great toe nail. All nails debrided. Betadine dressing applied to left great toe. Return in 2 month.

## 2017-04-20 ENCOUNTER — Other Ambulatory Visit: Payer: Self-pay

## 2017-04-20 ENCOUNTER — Emergency Department (HOSPITAL_BASED_OUTPATIENT_CLINIC_OR_DEPARTMENT_OTHER): Payer: Medicare Other

## 2017-04-20 ENCOUNTER — Encounter (HOSPITAL_BASED_OUTPATIENT_CLINIC_OR_DEPARTMENT_OTHER): Payer: Self-pay | Admitting: Emergency Medicine

## 2017-04-20 ENCOUNTER — Observation Stay (HOSPITAL_BASED_OUTPATIENT_CLINIC_OR_DEPARTMENT_OTHER)
Admission: EM | Admit: 2017-04-20 | Discharge: 2017-04-21 | Disposition: A | Discharge status: Home / Self Care | Payer: Medicare Other | Attending: Internal Medicine | Admitting: Internal Medicine

## 2017-04-20 DIAGNOSIS — J45909 Unspecified asthma, uncomplicated: Secondary | ICD-10-CM | POA: Diagnosis not present

## 2017-04-20 DIAGNOSIS — I129 Hypertensive chronic kidney disease with stage 1 through stage 4 chronic kidney disease, or unspecified chronic kidney disease: Secondary | ICD-10-CM | POA: Insufficient documentation

## 2017-04-20 DIAGNOSIS — Z7982 Long term (current) use of aspirin: Secondary | ICD-10-CM | POA: Diagnosis not present

## 2017-04-20 DIAGNOSIS — G459 Transient cerebral ischemic attack, unspecified: Secondary | ICD-10-CM | POA: Diagnosis not present

## 2017-04-20 DIAGNOSIS — I10 Essential (primary) hypertension: Secondary | ICD-10-CM | POA: Diagnosis present

## 2017-04-20 DIAGNOSIS — N182 Chronic kidney disease, stage 2 (mild): Secondary | ICD-10-CM | POA: Diagnosis present

## 2017-04-20 DIAGNOSIS — H538 Other visual disturbances: Secondary | ICD-10-CM

## 2017-04-20 DIAGNOSIS — D649 Anemia, unspecified: Secondary | ICD-10-CM | POA: Diagnosis not present

## 2017-04-20 DIAGNOSIS — K219 Gastro-esophageal reflux disease without esophagitis: Secondary | ICD-10-CM | POA: Diagnosis present

## 2017-04-20 DIAGNOSIS — Y33XXXD Other specified events, undetermined intent, subsequent encounter: Secondary | ICD-10-CM | POA: Diagnosis not present

## 2017-04-20 DIAGNOSIS — Z79899 Other long term (current) drug therapy: Secondary | ICD-10-CM | POA: Diagnosis not present

## 2017-04-20 DIAGNOSIS — S97111D Crushing injury of right great toe, subsequent encounter: Secondary | ICD-10-CM | POA: Diagnosis not present

## 2017-04-20 DIAGNOSIS — S99921S Unspecified injury of right foot, sequela: Secondary | ICD-10-CM

## 2017-04-20 DIAGNOSIS — J4520 Mild intermittent asthma, uncomplicated: Secondary | ICD-10-CM

## 2017-04-20 DIAGNOSIS — R42 Dizziness and giddiness: Secondary | ICD-10-CM

## 2017-04-20 DIAGNOSIS — D500 Iron deficiency anemia secondary to blood loss (chronic): Secondary | ICD-10-CM | POA: Insufficient documentation

## 2017-04-20 DIAGNOSIS — R55 Syncope and collapse: Secondary | ICD-10-CM | POA: Diagnosis not present

## 2017-04-20 DIAGNOSIS — E039 Hypothyroidism, unspecified: Secondary | ICD-10-CM | POA: Diagnosis present

## 2017-04-20 HISTORY — DX: Chronic kidney disease, stage 2 (mild): N18.2

## 2017-04-20 HISTORY — DX: Syncope and collapse: R55

## 2017-04-20 HISTORY — DX: Anemia, unspecified: D64.9

## 2017-04-20 HISTORY — DX: Unspecified injury of right foot, sequela: S99.921S

## 2017-04-20 LAB — COMPREHENSIVE METABOLIC PANEL
ALBUMIN: 3.7 g/dL (ref 3.5–5.0)
ALK PHOS: 73 U/L (ref 38–126)
ALT: 16 U/L (ref 14–54)
ANION GAP: 8 (ref 5–15)
AST: 27 U/L (ref 15–41)
BUN: 18 mg/dL (ref 6–20)
CALCIUM: 8.9 mg/dL (ref 8.9–10.3)
CHLORIDE: 106 mmol/L (ref 101–111)
CO2: 24 mmol/L (ref 22–32)
Creatinine, Ser: 1.27 mg/dL — ABNORMAL HIGH (ref 0.44–1.00)
GFR calc non Af Amer: 39 mL/min — ABNORMAL LOW (ref >60)
GFR, EST AFRICAN AMERICAN: 46 mL/min — AB (ref >60)
Glucose, Bld: 134 mg/dL — ABNORMAL HIGH (ref 65–99)
POTASSIUM: 3.6 mmol/L (ref 3.5–5.1)
SODIUM: 138 mmol/L (ref 135–145)
Total Bilirubin: 0.4 mg/dL (ref 0.3–1.2)
Total Protein: 7 g/dL (ref 6.5–8.1)

## 2017-04-20 LAB — DIFFERENTIAL
BASOS PCT: 1 %
Basophils Absolute: 0 10*3/uL (ref 0.0–0.1)
EOS ABS: 0 10*3/uL (ref 0.0–0.7)
EOS PCT: 1 %
Lymphocytes Relative: 30 %
Lymphs Abs: 1.9 10*3/uL (ref 0.7–4.0)
MONO ABS: 0.9 10*3/uL (ref 0.1–1.0)
Monocytes Relative: 14 %
NEUTROS PCT: 54 %
Neutro Abs: 3.5 10*3/uL (ref 1.7–7.7)

## 2017-04-20 LAB — CBC
HCT: 32.5 % — ABNORMAL LOW (ref 36.0–46.0)
Hemoglobin: 10.5 g/dL — ABNORMAL LOW (ref 12.0–15.0)
MCH: 26.4 pg (ref 26.0–34.0)
MCHC: 32.3 g/dL (ref 30.0–36.0)
MCV: 81.9 fL (ref 78.0–100.0)
PLATELETS: 257 10*3/uL (ref 150–400)
RBC: 3.97 MIL/uL (ref 3.87–5.11)
RDW: 14.9 % (ref 11.5–15.5)
WBC: 6.4 10*3/uL (ref 4.0–10.5)

## 2017-04-20 LAB — CBG MONITORING, ED: GLUCOSE-CAPILLARY: 122 mg/dL — AB (ref 65–99)

## 2017-04-20 LAB — PROTIME-INR
INR: 1.1
PROTHROMBIN TIME: 14.1 s (ref 11.4–15.2)

## 2017-04-20 LAB — TROPONIN I: Troponin I: <0.03 ng/mL (ref <0.03)

## 2017-04-20 LAB — APTT: aPTT: 30 seconds (ref 24–36)

## 2017-04-20 MED ORDER — ACETAMINOPHEN 325 MG PO TABS: 650.0000 mg | ORAL_TABLET | Freq: Four times a day (QID) | ORAL | Status: DC | PRN

## 2017-04-20 MED ORDER — PANTOPRAZOLE SODIUM 40 MG PO TBEC
40.0000 mg | DELAYED_RELEASE_TABLET | Freq: Two times a day (BID) | ORAL | Status: DC
  Administered 2017-04-20 – 2017-04-21 (×2): 40 mg via ORAL
  Filled 2017-04-20 (×2): qty 1

## 2017-04-20 MED ORDER — ONDANSETRON HCL 4 MG PO TABS: 4.0000 mg | ORAL_TABLET | Freq: Four times a day (QID) | ORAL | Status: DC | PRN

## 2017-04-20 MED ORDER — ENOXAPARIN SODIUM 40 MG/0.4ML ~~LOC~~ SOLN
40.0000 mg | SUBCUTANEOUS | Status: DC
  Administered 2017-04-20: 40 mg via SUBCUTANEOUS
  Filled 2017-04-20: qty 0.4

## 2017-04-20 MED ORDER — POTASSIUM CHLORIDE IN NACL 20-0.9 MEQ/L-% IV SOLN
INTRAVENOUS | Status: AC
  Administered 2017-04-20: 21:00:00 via INTRAVENOUS
  Filled 2017-04-20: qty 1000

## 2017-04-20 MED ORDER — ASPIRIN 81 MG PO CHEW
81.0000 mg | CHEWABLE_TABLET | ORAL | Status: DC
  Administered 2017-04-21: 81 mg via ORAL
  Filled 2017-04-20: qty 1

## 2017-04-20 MED ORDER — ALBUTEROL SULFATE (2.5 MG/3ML) 0.083% IN NEBU: 2.5000 mg | INHALATION_SOLUTION | Freq: Four times a day (QID) | RESPIRATORY_TRACT | Status: DC | PRN

## 2017-04-20 MED ORDER — CEPHALEXIN 500 MG PO CAPS
500.0000 mg | ORAL_CAPSULE | Freq: Four times a day (QID) | ORAL | Status: DC
  Administered 2017-04-20 – 2017-04-21 (×3): 500 mg via ORAL
  Filled 2017-04-20 (×3): qty 1

## 2017-04-20 MED ORDER — ACETAMINOPHEN 650 MG RE SUPP: 650.0000 mg | Freq: Four times a day (QID) | RECTAL | Status: DC | PRN

## 2017-04-20 MED ORDER — HYDROCODONE-ACETAMINOPHEN 5-325 MG PO TABS: 1.0000 | ORAL_TABLET | ORAL | Status: DC | PRN

## 2017-04-20 MED ORDER — AMLODIPINE BESYLATE 5 MG PO TABS
5.0000 mg | ORAL_TABLET | Freq: Every day | ORAL | Status: DC
  Administered 2017-04-21: 5 mg via ORAL
  Filled 2017-04-20: qty 1

## 2017-04-20 MED ORDER — SODIUM CHLORIDE 0.9% FLUSH
3.0000 mL | Freq: Two times a day (BID) | INTRAVENOUS | Status: DC
  Administered 2017-04-20 – 2017-04-21 (×2): 3 mL via INTRAVENOUS

## 2017-04-20 MED ORDER — ONDANSETRON HCL 4 MG/2ML IJ SOLN: 4.0000 mg | Freq: Four times a day (QID) | INTRAMUSCULAR | Status: DC | PRN

## 2017-04-20 MED ORDER — ADULT MULTIVITAMIN W/MINERALS CH
1.0000 | ORAL_TABLET | Freq: Every day | ORAL | Status: DC
  Administered 2017-04-21: 1 via ORAL
  Filled 2017-04-20: qty 1

## 2017-04-20 MED ORDER — MONTELUKAST SODIUM 10 MG PO TABS
10.0000 mg | ORAL_TABLET | Freq: Every day | ORAL | Status: DC
  Filled 2017-04-20: qty 1

## 2017-04-20 MED ORDER — LEVOTHYROXINE SODIUM 25 MCG PO TABS
25.0000 ug | ORAL_TABLET | Freq: Every day | ORAL | Status: DC
  Administered 2017-04-21: 25 ug via ORAL
  Filled 2017-04-20: qty 1

## 2017-04-20 MED ORDER — SENNOSIDES-DOCUSATE SODIUM 8.6-50 MG PO TABS: 1.0000 | ORAL_TABLET | Freq: Every evening | ORAL | Status: DC | PRN

## 2017-04-20 MED ORDER — ATENOLOL 25 MG PO TABS: 12.5000 mg | ORAL_TABLET | Freq: Every day | ORAL | Status: DC
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
## 2017-04-20 NOTE — ED Notes (Signed)
Per Trinna PostAlex, PA - pt is not a CODE STROKE - pt is a STAT Neuro Consult for TIA workup.

## 2017-04-20 NOTE — ED Notes (Signed)
Patient transported to CT 

## 2017-04-20 NOTE — Progress Notes (Signed)
Patient admitted to 3w-30 at 1835, vitals taken, will review orders

## 2017-04-20 NOTE — ED Triage Notes (Signed)
Bilateral blurred vision x 5 minutes. Denies pain, weakness to arms/legs. Also c/o dizziness.
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
## 2017-04-20 NOTE — ED Notes (Signed)
ED Provider at bedside. 

## 2017-04-20 NOTE — H&P (Signed)
History and Physical    Dominique Ochoa ZOX:[id number] DOB: 12-Sep-1940 DOA: 04/20/2017  PCP: Albertina SenegalPollock, Nelson, MD   Patient [facility]   Chief Complaint: Transient blurred vision and lightheadedness   HPI: Dominique Ochoa is a 77 y.o. female with medical history significant for hypertension, hypothyroidism, asthma, and GERD, now presenting to the emergency department after a episode of blurred vision and lightheadedness.  Patient reports that she been in her usual state of health, was having an uneventful day, and was a passenger in a car when she developed blurred vision affecting both eyes, felt flushed, and became lightheaded, feeling as though she may pass out.  She never did lose consciousness and symptoms completely resolved within several minutes.  She reports occasional palpitations going back many years, but no recent increase and no chest pain.  She denies headache or focal numbness or weakness.  She reports similar symptoms approximately a year [facility] that [facility]; she reports being told to drink more water at that time.  No recent fevers, chills, cough, or increase in her chronic dyspnea.  ED Course: Upon arrival to the ED, patient is found to be afebrile, saturating well on room air, and with vitals otherwise normal.  EKG features a sinus rhythm with RBBB, LAFB, and similar to prior.  Chest x-rays negative for acute cardiopulmonary disease and noncontrast head CT is a normal study.  Chemistry panel is notable for a creatinine 1.27, up from an apparent baseline of roughly 1.  CBC features a normocytic anemia with hemoglobin 10.5, down from priors in the 11.5 range.  Troponin is undetectable.  Patient has remained hemodynamically stable, in no apparent respiratory distress, and will be observed on the telemetry unit for ongoing evaluation and management of transient blurred vision and near syncope.  Review of Systems:  All other systems reviewed  and apart from HPI, are negative.  Past Medical History:  Diagnosis Date  . Asthma   . GERD (gastroesophageal reflux disease)   . Hypertension   . Thyroid disease     History reviewed. No pertinent surgical history.   reports that she has never smoked. She has never used smokeless tobacco. She reports that she does not drink alcohol or use drugs.  Allergies  Allergen Reactions  . Fish Allergy Other (See Comments)    Makes her weak  . Fish Oil Other (See Comments)    MD orders    History reviewed. No pertinent family history.   Prior to Admission medications   Medication Sig Start Date End Date Taking? Authorizing Provider  albuterol (PROVENTIL HFA;VENTOLIN HFA) 108 (90 Base) MCG/ACT inhaler Inhale 1 puff into the lungs every 6 (six) hours as needed for wheezing or shortness of breath.    [provider]  albuterol (PROVENTIL) (5 MG/ML) 0.5% nebulizer solution Take 0.5 mLs (2.5 mg total) by nebulization every 6 (six) hours as needed for wheezing or shortness of breath. 12/21/16   Gwyneth SproutPlunkett, Whitney, MD  amLODipine (NORVASC) 5 MG tablet Take 5 mg by mouth daily.    [provider]  aspirin 81 MG tablet Take 81 mg by mouth every other day.     [provider]  atenolol (TENORMIN) 25 MG tablet Take 12.5 mg by mouth daily.     [provider]  cephALEXin (KEFLEX) 500 MG capsule Take 1 capsule (500 mg total) by mouth 4 (four) times daily for 10 days. 04/12/17 04/22/17  Cristina GongHammond, Elizabeth W, PA-C  levothyroxine (SYNTHROID, LEVOTHROID) 25 MCG tablet Take 25 mcg by mouth daily before breakfast.    [provider]  lisinopril-hydrochlorothiazide (PRINZIDE,ZESTORETIC) 20-25 MG tablet Take 1 tablet by mouth daily.    [provider]  montelukast (SINGULAIR) 10 MG tablet Take 10 mg by mouth daily.     [provider]  Multiple Vitamin (MULTIVITAMIN WITH MINERALS) TABS tablet Take 1 tablet by mouth daily.    [provider]    pantoprazole (PROTONIX) 40 MG tablet Take 40 mg by mouth 2 (two) times daily.    [provider]  Vitamin D, Ergocalciferol, (DRISDOL) 50000 units CAPS capsule Take 50,000 Units by mouth every 7 (seven) days. Tuesday of each week    [provider]    Physical Exam: Vitals:   04/20/17 1602 04/20/17 1700 04/20/17 1715 04/20/17 1800  BP: 130/80 (!) 141/74  (!) 144/73  Pulse: 77 76 71 84  Resp: 20 (!) 26 18   Temp: 97.7 F (36.5 C)   98.4 F (36.9 C)  TempSrc: Oral   Oral  SpO2: 100% 100% 98% 100%  Weight:          Constitutional: NAD, calm  Eyes: PERTLA, lids and conjunctivae normal ENMT: Mucous membranes are moist. Posterior pharynx clear of any exudate or lesions.   Neck: normal, supple, no masses, no thyromegaly Respiratory: clear to auscultation bilaterally, no wheezing, no crackles. Normal respiratory effort.   Cardiovascular: S1 & S2 heard, regular rate and rhythm. Trace pretibial edema. No significant JVD. Abdomen: No distension, no tenderness, no masses palpated. Bowel sounds normal.  Musculoskeletal: no clubbing / cyanosis. No joint deformity upper and lower extremities.   Skin: no significant rashes, lesions, ulcers. Warm, dry, well-perfused. Neurologic: CN 2-12 grossly intact. Sensation intact. Strength 5/5 in all 4 limbs.  Psychiatric: Alert and oriented x 3. Calm, cooperative.     Labs on Admission: I have personally reviewed following labs and imaging studies  CBC: Recent Labs  Lab 04/20/17 1457  WBC 6.4  NEUTROABS 3.5  HGB 10.5*  HCT 32.5*  MCV 81.9  PLT 257   Basic Metabolic Panel: Recent Labs  Lab 04/20/17 1457  NA 138  K 3.6  CL 106  CO2 24  GLUCOSE 134*  BUN 18  CREATININE 1.27*  CALCIUM 8.9   GFR: Estimated Creatinine Clearance: 35.5 mL/min (A) (by C-G formula based on SCr of 1.27 mg/dL (H)). Liver Function Tests: Recent Labs  Lab 04/20/17 1457  AST 27  ALT 16  ALKPHOS 73  BILITOT 0.4  PROT 7.0  ALBUMIN 3.7    No results for input(s): LIPASE, AMYLASE in the last 168 hours. No results for input(s): AMMONIA in the last 168 hours. Coagulation Profile: Recent Labs  Lab 04/20/17 1457  INR 1.10   Cardiac Enzymes: Recent Labs  Lab 04/20/17 1457  TROPONINI <0.03   BNP (last 3 results) No results for input(s): PROBNP in the last 8760 hours. HbA1C: No results for input(s): HGBA1C in the last 72 hours. CBG: Recent Labs  Lab 04/20/17 1449  GLUCAP 122*   Lipid Profile: No results for input(s): CHOL, HDL, LDLCALC, TRIG, CHOLHDL, LDLDIRECT in the last 72 hours. Thyroid Function Tests: No results for input(s): TSH, T4TOTAL, FREET4, T3FREE, THYROIDAB in the last 72 hours. Anemia Panel: No results for input(s): VITAMINB12, FOLATE, FERRITIN, TIBC, IRON, RETICCTPCT in the last 72 hours. Urine analysis:    Component Value Date/Time   COLORURINE YELLOW 10/09/2016 0815   APPEARANCEUR CLEAR 10/09/2016 0815  LABSPEC 1.010 10/09/2016 0815   PHURINE 7.5 10/09/2016 0815   GLUCOSEU NEGATIVE 10/09/2016 0815   HGBUR NEGATIVE 10/09/2016 0815   BILIRUBINUR NEGATIVE 10/09/2016 0815   KETONESUR NEGATIVE 10/09/2016 0815   PROTEINUR NEGATIVE 10/09/2016 0815   UROBILINOGEN 1.0 12/03/2013 2108   NITRITE NEGATIVE 10/09/2016 0815   LEUKOCYTESUR NEGATIVE 10/09/2016 0815   Sepsis Labs: @LABRCNTIP (procalcitonin:4,lacticidven:4) )No results found for this or any previous visit (from the past 240 hour(s)).   Radiological Exams on Admission: Dg Chest 2 View  Result Date: 04/20/2017 CLINICAL DATA:  Blurred vision and lightheadedness. EXAM: CHEST - 2 VIEW COMPARISON:  December 21, 2016 FINDINGS: The heart size and mediastinal contours are within normal limits. Both lungs are clear. The visualized skeletal structures are unremarkable. IMPRESSION: No active cardiopulmonary disease. Electronically Signed   By: Gerome Sam III M.D   On: 04/20/2017 17:36   Ct Head Code Stroke Wo Contrast`  Result Date:  04/20/2017 CLINICAL DATA:  Code stroke. Episode of blurred vision and dizziness lasting 5 minutes. Previous stroke. Visual loss. EXAM: CT HEAD WITHOUT CONTRAST TECHNIQUE: Contiguous axial images were obtained from the base of the skull through the vertex without intravenous contrast. COMPARISON:  CT head without contrast 04/15/2015 FINDINGS: Brain: Mild atrophy and white matter changes are within normal limits for age. No acute infarct, hemorrhage, or mass lesion is present. The ventricles are of normal size. No significant extra-axial fluid collection is present. Vascular: Atherosclerotic changes are noted within the cavernous internal carotid arteries bilaterally. There is no hyperdense vessel. Skull: Calvarium is intact. No focal lytic or blastic lesions are present. No acute or healing fractures are noted. The extracranial soft tissues are within normal limits. Sinuses/Orbits: The paranasal sinuses and mastoid air cells are clear. Globes and orbits are within normal limits. ASPECTS [facility] Stroke Program Early CT Score) - Ganglionic level infarction (caudate, lentiform nuclei, internal capsule, insula, M1-M3 cortex): 7/7 - Supraganglionic infarction (M4-M6 cortex): 3/3 Total score (0-10 with 10 being normal): 10/10 IMPRESSION: 1. Normal CT of the head for age.  No acute abnormality. 2. Atherosclerosis. 3. ASPECTS is 10/10 These results were called by telephone at the time of interpretation on 04/20/2017 at 3:14 pm to [facility], [place], who verbally acknowledged these results. Electronically Signed   By: Marin Roberts M.D.   On: 04/20/2017 15:17    EKG: Independently reviewed. Sinus rhythm, RBBB, LAFB, similar to prior.   Assessment/Plan   1. Near-syncope  - Presents after a transient episode of blurred vision and lightheadedness, felt as though she would pass out but did not lose consciousness and recovered fully within several minutes  - Reports occasional palpitation for years, no recent  change, no chest pain  - No focal deficit identified and head CT negative  - Discussed with neurology, seems more consistent with near-syncope rather than TIA/CVA though difficult to completely exclude  - Continue cardiac monitoring, check orthostatic vitals, echocardiogram, MRI brain, hydrate with IVF    2. Mild renal insufficiency  - SCr is 1.27 on admission, up from priors in 1.0 range  - Suspect hypovolemia  - Hold lisinopril-HCTZ, hydrate with NS overnight, repeat chem panel in am    3. Hypertension  - BP at goal  - Continue Norvasc and atenolol - Given increased SCr with clinical dehydration, will hold lisinopril-HCTZ while hydrating    4. Hypothyroidism  - Continue Synthroid    5. GERD  - No EGD report on file  - Managed at home with BID PPI, will  continue   6. Normocytic anemia - Hgb is 10.5 on admission, down from prior in 11.5 range  - Being evaluated by PCP, reports negative FOBT recently  - Continue outpatient follow-up   7. Toe injury  - Patient had recent right great toe injury, saw podiatry, and had nail removed on 3/21  - She was started on Keflex prophylaxis, will continue   8. Asthma - Stable  - Continue prn albuterol    DVT prophylaxis: Lovenox Code Status: Full  Family Communication: Family updated at bedside Consults called: Discussed with neurology, not formally consulted Admission status: Observation    Briscoe Deutscher, MD [facility] Pager [phone]  If 7PM-7AM, please contact night-coverage [email or web address] Password [facility]  04/20/2017, 7:47 PM

## 2017-04-21 ENCOUNTER — Observation Stay (HOSPITAL_COMMUNITY): Payer: Medicare Other

## 2017-04-21 ENCOUNTER — Other Ambulatory Visit (HOSPITAL_COMMUNITY): Payer: Medicare Other

## 2017-04-21 DIAGNOSIS — K219 Gastro-esophageal reflux disease without esophagitis: Secondary | ICD-10-CM

## 2017-04-21 DIAGNOSIS — R55 Syncope and collapse: Secondary | ICD-10-CM | POA: Diagnosis not present

## 2017-04-21 DIAGNOSIS — D649 Anemia, unspecified: Secondary | ICD-10-CM

## 2017-04-21 LAB — BASIC METABOLIC PANEL
Anion gap: 11 (ref 5–15)
BUN: 13 mg/dL (ref 6–20)
CALCIUM: 8.6 mg/dL — AB (ref 8.9–10.3)
CO2: 24 mmol/L (ref 22–32)
CREATININE: 0.94 mg/dL (ref 0.44–1.00)
Chloride: 106 mmol/L (ref 101–111)
GFR calc non Af Amer: 57 mL/min — ABNORMAL LOW (ref >60)
Glucose, Bld: 94 mg/dL (ref 65–99)
Potassium: 3.9 mmol/L (ref 3.5–5.1)
SODIUM: 141 mmol/L (ref 135–145)

## 2017-04-21 LAB — CBC
HCT: 30.1 % — ABNORMAL LOW (ref 36.0–46.0)
Hemoglobin: 9.6 g/dL — ABNORMAL LOW (ref 12.0–15.0)
MCH: 25.7 pg — AB (ref 26.0–34.0)
MCHC: 31.9 g/dL (ref 30.0–36.0)
MCV: 80.7 fL (ref 78.0–100.0)
PLATELETS: 219 10*3/uL (ref 150–400)
RBC: 3.73 MIL/uL — AB (ref 3.87–5.11)
RDW: 14.8 % (ref 11.5–15.5)
WBC: 4.4 10*3/uL (ref 4.0–10.5)

## 2017-04-21 LAB — GLUCOSE, CAPILLARY: Glucose-Capillary: 82 mg/dL (ref 65–99)

## 2017-04-21 NOTE — Progress Notes (Addendum)
PT Cancellation Note/Discharge  Patient Details Name: Verlin Festeradine Sawyers MRN: [id number] DOB: 09-10-1940   Cancelled Treatment:    Reason Eval/Treat Not Completed: PT screened, no needs identified, will sign off Pt is independent with gait and mobility.  She was able to do stairs and had no new physical deficits.  She had no symptoms of lightheadedness with gait.  PT was not in the room long enough to charge an eval.   Thanks,   Lurena Joinerebecca B. Julieana Eshleman, PT, DPT [phone]   04/21/2017, 1:07 PM

## 2017-04-21 NOTE — Care Management Note (Signed)
Case Management Note  Patient Details  Name: Dominique Ochoa MRN: [id number] Date of Birth: 08-15-1940  Subjective/Objective:                    Action/Plan: Pt discharged home with self care. Pt has PCP, insurance and transportation home.   Expected Discharge Date:  04/21/17               Expected Discharge Plan:  Home/Self Care  In-House Referral:     Discharge planning Services     Post Acute Care Choice:    Choice offered to:     DME Arranged:    DME Agency:     HH Arranged:    HH Agency:     Status of Service:  Completed, signed off  If discussed at [employer] Length of Stay Meetings, dates discussed:    Additional Comments:  Kermit BaloKelli F Brynn Reznik, RN 04/21/2017, 4:41 PM

## 2017-04-21 NOTE — Discharge Summary (Signed)
Physician Discharge Summary  Dominique Ochoa ZOX:[id number] DOB: [birth year] DOA: 04/20/2017  PCP: Albertina Senegal, MD  Admit date: 04/20/2017 Discharge date: 04/21/2017   Recommendations for Outpatient Follow-Up:   1. Encourage hydration   Discharge Diagnosis:   Principal Problem:   Near syncope Active Problems:   HTN (hypertension)   Hypothyroidism   Asthma   GERD (gastroesophageal reflux disease)   Normocytic anemia   CKD (chronic kidney disease), stage II   Injury of right toe, sequela   Discharge disposition:  Home.    Discharge Condition: Improved.  Diet recommendation: Low sodium, heart healthy.    Wound care: None.   History of Present Illness:   Dominique Ochoa is a 77 y.o. female with medical history significant for hypertension, hypothyroidism, asthma, and GERD, now presenting to the emergency department after a episode of blurred vision and lightheadedness.  Patient reports that she been in her usual state of health, was having an uneventful day, and was a passenger in a car when she developed blurred vision affecting both eyes, felt flushed, and became lightheaded, feeling as though she may pass out.  She never did lose consciousness and symptoms completely resolved within several minutes.  She reports occasional palpitations going back many years, but no recent increase and no chest pain.  She denies headache or focal numbness or weakness.  She reports similar symptoms approximately a year ago that was evaluated at [facility]; she reports being told to drink more water at that time.  No recent fevers, chills, cough, or increase in her chronic dyspnea.     Hospital Course by Problem:    Near-syncope  - Presents after a transient episode of blurred vision and lightheadedness, felt as though she would pass out but did not lose consciousness and recovered fully within several minutes  - Reports occasional palpitation for years, no recent change, no chest  pain  - No focal deficit identified and head CT negative  -  more consistent with near-syncope rather than TIA/CVA though difficult to completely exclude  - MRI negative  Mild renal insufficiency  - SCr is 1.27 on admission, up from priors in 1.0 range  - Suspect hypovolemia  - encouraged hydration  Hypertension  - BP at goal  - Continue Norvasc    Hypothyroidism  - Continue Synthroid    GERD  - No EGD report on file  - Managed at home with BID PPI, will continue    Normocytic anemia - Being evaluated by PCP, reports negative FOBT recently  - Continue outpatient follow-up   Toe injury  - Patient had recent right great toe injury, saw podiatry, and had nail removed on 3/21  - She was started on Keflex prophylaxis, will continue   Asthma - Stable  - Continue prn albuterol       Medical Consultants:    None.   Discharge Exam:   Vitals:   04/21/17 0900 04/21/17 1200  BP: 130/68 132/74  Pulse: 80 76  Resp: 18 18  Temp: 97.9 F (36.6 C) 98 F (36.7 C)  SpO2: 99% 100%   Vitals:   04/21/17 0347 04/21/17 0400 04/21/17 0900 04/21/17 1200  BP: 126/62  130/68 132/74  Pulse: 76  80 76  Resp: 18  18 18   Temp: 98.3 F (36.8 C)  97.9 F (36.6 C) 98 F (36.7 C)  TempSrc: Oral  Oral Oral  SpO2: 99%  99% 100%  Weight:  68.7 kg (151 lb 7.3 oz)  Height:  5\' 7"  (1.702 m)      Gen:  NAD   The results of significant diagnostics from this hospitalization (including imaging, microbiology, ancillary and laboratory) are listed below for reference.     Procedures and Diagnostic Studies:   Dg Chest 2 View  Result Date: 04/20/2017 CLINICAL DATA:  Blurred vision and lightheadedness. EXAM: CHEST - 2 VIEW COMPARISON:  December 21, 2016 FINDINGS: The heart size and mediastinal contours are within normal limits. Both lungs are clear. The visualized skeletal structures are unremarkable. IMPRESSION: No active cardiopulmonary disease. Electronically Signed   By:  Gerome Sam III M.D   On: 04/20/2017 17:36   Mr Brain Wo Contrast  Result Date: 04/21/2017 CLINICAL DATA:  TIA.  Blurred vision and lightheaded. EXAM: MRI HEAD WITHOUT CONTRAST TECHNIQUE: Multiplanar, multiecho pulse sequences of the brain and surrounding structures were obtained without intravenous contrast. COMPARISON:  CT head 04/20/2017 FINDINGS: Brain: Ventricle size normal.  Cerebral volume normal for age. Negative for acute infarct, hemorrhage, or mass. Scattered small subcortical white matter hyperintensities bilaterally are mild. Brainstem and basal ganglia normal. Vascular: Normal arterial flow voids Skull and upper cervical spine: Negative Sinuses/Orbits: Negative Other: None IMPRESSION: No acute abnormality. Mild changes in the subcortical white matter likely due to chronic microvascular ischemia. Electronically Signed   By: Marlan Palau M.D.   On: 04/21/2017 08:51   Ct Head Code Stroke Wo Contrast`  Result Date: 04/20/2017 CLINICAL DATA:  Code stroke. Episode of blurred vision and dizziness lasting 5 minutes. Previous stroke. Visual loss. EXAM: CT HEAD WITHOUT CONTRAST TECHNIQUE: Contiguous axial images were obtained from the base of the skull through the vertex without intravenous contrast. COMPARISON:  CT head without contrast 04/15/2015 FINDINGS: Brain: Mild atrophy and white matter changes are within normal limits for age. No acute infarct, hemorrhage, or mass lesion is present. The ventricles are of normal size. No significant extra-axial fluid collection is present. Vascular: Atherosclerotic changes are noted within the cavernous internal carotid arteries bilaterally. There is no hyperdense vessel. Skull: Calvarium is intact. No focal lytic or blastic lesions are present. No acute or healing fractures are noted. The extracranial soft tissues are within normal limits. Sinuses/Orbits: The paranasal sinuses and mastoid air cells are clear. Globes and orbits are within normal limits.  ASPECTS [facility] Stroke Program Early CT Score) - Ganglionic level infarction (caudate, lentiform nuclei, internal capsule, insula, M1-M3 cortex): 7/7 - Supraganglionic infarction (M4-M6 cortex): 3/3 Total score (0-10 with 10 being normal): 10/10 IMPRESSION: 1. Normal CT of the head for age.  No acute abnormality. 2. Atherosclerosis. 3. ASPECTS is 10/10 These results were called by telephone at the time of interpretation on 04/20/2017 at 3:14 pm to [facility], [place], who verbally acknowledged these results. Electronically Signed   By: Marin Roberts M.D.   On: 04/20/2017 15:17     Labs:   Basic Metabolic Panel: Recent Labs  Lab 04/20/17 1457 04/21/17 0601  NA 138 141  K 3.6 3.9  CL 106 106  CO2 24 24  GLUCOSE 134* 94  BUN 18 13  CREATININE 1.27* 0.94  CALCIUM 8.9 8.6*   GFR Estimated Creatinine Clearance: 48 mL/min (by C-G formula based on SCr of 0.94 mg/dL). Liver Function Tests: Recent Labs  Lab 04/20/17 1457  AST 27  ALT 16  ALKPHOS 73  BILITOT 0.4  PROT 7.0  ALBUMIN 3.7   No results for input(s): LIPASE, AMYLASE in the last 168 hours. No results for input(s): AMMONIA in the last  168 hours. Coagulation profile Recent Labs  Lab 04/20/17 1457  INR 1.10    CBC: Recent Labs  Lab 04/20/17 1457 04/21/17 0601  WBC 6.4 4.4  NEUTROABS 3.5  --   HGB 10.5* 9.6*  HCT 32.5* 30.1*  MCV 81.9 80.7  PLT 257 219   Cardiac Enzymes: Recent Labs  Lab 04/20/17 1457  TROPONINI <0.03   BNP: Invalid input(s): POCBNP CBG: Recent Labs  Lab 04/20/17 1449 04/21/17 0503  GLUCAP 122* 82   D-Dimer No results for input(s): DDIMER in the last 72 hours. Hgb A1c No results for input(s): HGBA1C in the last 72 hours. Lipid Profile No results for input(s): CHOL, HDL, LDLCALC, TRIG, CHOLHDL, LDLDIRECT in the last 72 hours. Thyroid function studies No results for input(s): TSH, T4TOTAL, T3FREE, THYROIDAB in the last 72 hours.  Invalid input(s): FREET3 Anemia work up No  results for input(s): VITAMINB12, FOLATE, FERRITIN, TIBC, IRON, RETICCTPCT in the last 72 hours. Microbiology No results found for this or any previous visit (from the past 240 hour(s)).   Discharge Instructions:   Discharge Instructions    Diet - low sodium heart healthy   Complete by:  As directed    Discharge instructions   Complete by:  As directed    Stay hydrated with water-- consider adding flavor to your water   Increase activity slowly   Complete by:  As directed      Allergies as of 04/21/2017      Reactions   Fish Allergy Other (See Comments)   Makes her weak   Fish Oil Other (See Comments), Swelling   MD orders   Lisinopril Swelling   Facial swelling      Medication List    TAKE these medications   albuterol 108 (90 Base) MCG/ACT inhaler Commonly known as:  PROVENTIL HFA;VENTOLIN HFA Inhale 1 puff into the lungs every 6 (six) hours as needed for wheezing or shortness of breath.   albuterol (5 MG/ML) 0.5% nebulizer solution Commonly known as:  PROVENTIL Take 0.5 mLs (2.5 mg total) by nebulization every 6 (six) hours as needed for wheezing or shortness of breath.   amLODipine 5 MG tablet Commonly known as:  NORVASC Take 5 mg by mouth daily.   aspirin 81 MG tablet Take 81 mg by mouth every other day.   cephALEXin 500 MG capsule Commonly known as:  KEFLEX Take 1 capsule (500 mg total) by mouth 4 (four) times daily for 10 days.   levothyroxine 25 MCG tablet Commonly known as:  SYNTHROID, LEVOTHROID Take 25 mcg by mouth daily before breakfast.   montelukast 10 MG tablet Commonly known as:  SINGULAIR Take 10 mg by mouth daily.   multivitamin with minerals Tabs tablet Take 1 tablet by mouth daily.   pantoprazole 40 MG tablet Commonly known as:  PROTONIX Take 40 mg by mouth 2 (two) times daily.   Vitamin D (Ergocalciferol) 50000 units Caps capsule Commonly known as:  DRISDOL Take 50,000 Units by mouth every 7 (seven) days. Tuesday of each week        Follow-up Information    Albertina Senegal, MD Follow up in 1 week(s).   Specialty:  Internal Medicine Contact information: [address] [place] [address] [phone]            Time coordinating discharge: 35 min  Signed:  Joseph Art   [facility] 04/21/2017, 2:01 PM

## 2017-04-21 NOTE — Plan of Care (Signed)
Adequate for DC.

## 2017-05-17 ENCOUNTER — Emergency Department (HOSPITAL_BASED_OUTPATIENT_CLINIC_OR_DEPARTMENT_OTHER)
Admission: EM | Admit: 2017-05-17 | Discharge: 2017-05-17 | Disposition: A | Discharge status: Home / Self Care | Payer: Medicare Other | Attending: Emergency Medicine | Admitting: Emergency Medicine

## 2017-05-17 ENCOUNTER — Other Ambulatory Visit: Payer: Self-pay

## 2017-05-17 ENCOUNTER — Encounter (HOSPITAL_BASED_OUTPATIENT_CLINIC_OR_DEPARTMENT_OTHER): Payer: Self-pay | Admitting: Emergency Medicine

## 2017-05-17 DIAGNOSIS — I129 Hypertensive chronic kidney disease with stage 1 through stage 4 chronic kidney disease, or unspecified chronic kidney disease: Secondary | ICD-10-CM | POA: Diagnosis not present

## 2017-05-17 DIAGNOSIS — H1131 Conjunctival hemorrhage, right eye: Secondary | ICD-10-CM | POA: Diagnosis not present

## 2017-05-17 DIAGNOSIS — E039 Hypothyroidism, unspecified: Secondary | ICD-10-CM | POA: Insufficient documentation

## 2017-05-17 DIAGNOSIS — Z79899 Other long term (current) drug therapy: Secondary | ICD-10-CM | POA: Diagnosis not present

## 2017-05-17 DIAGNOSIS — E079 Disorder of thyroid, unspecified: Secondary | ICD-10-CM | POA: Insufficient documentation

## 2017-05-17 DIAGNOSIS — J45909 Unspecified asthma, uncomplicated: Secondary | ICD-10-CM | POA: Insufficient documentation

## 2017-05-17 DIAGNOSIS — N182 Chronic kidney disease, stage 2 (mild): Secondary | ICD-10-CM | POA: Diagnosis not present

## 2017-05-17 DIAGNOSIS — Z7982 Long term (current) use of aspirin: Secondary | ICD-10-CM | POA: Diagnosis not present

## 2017-05-17 DIAGNOSIS — H5789 Other specified disorders of eye and adnexa: Secondary | ICD-10-CM | POA: Diagnosis present

## 2017-05-17 MED ORDER — TETRACAINE HCL 0.5 % OP SOLN
OPHTHALMIC | Status: AC
  Filled 2017-05-17: qty 4

## 2017-05-17 MED ORDER — TETRACAINE HCL 0.5 % OP SOLN
2.0000 [drp] | Freq: Once | OPHTHALMIC | Status: AC
  Administered 2017-05-17: 2 [drp] via OPHTHALMIC

## 2017-05-17 NOTE — ED Notes (Signed)
ED Provider at bedside. 

## 2017-05-17 NOTE — ED Provider Notes (Signed)
[facility] EMERGENCY DEPARTMENT Provider Note   CSN: [id number] Arrival date & time: 05/17/17  1325     History   Chief Complaint Chief Complaint  Patient presents with  . Eye Problem    HPI Dominique Ochoa is a 77 y.o. female with history of hypertension, GERD, asthma who presents with sudden onset redness in her right eye.  Patient denies doing anything prior.  She denies sneezing or any trauma.  She denies any vision changes or pain.  She  wears glasses.  She does not wear contact lenses.  She denies any other symptoms.  She denies any chest pain, shortness of breath, abdominal pain, nausea, vomiting.  HPI  Past Medical History:  Diagnosis Date  . Asthma   . GERD (gastroesophageal reflux disease)   . Hypertension   . Thyroid disease     Patient Active Problem List   Diagnosis Date Noted  . Near syncope 04/20/2017  . Normocytic anemia 04/20/2017  . 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 Injury of right toe, sequela 04/20/2017  . Pain in the chest 09/16/2015  . HTN (hypertension) 09/16/2015  . Hypothyroidism 09/16/2015  . Asthma 09/16/2015  . GERD (gastroesophageal reflux disease) 09/16/2015  . Hyperglycemia 09/16/2015  . Abnormal finding on chest xray 09/16/2015    History reviewed. No pertinent surgical history.   OB History   None      Home Medications    Prior to Admission medications   Medication Sig Start Date End Date Taking? Authorizing Provider  albuterol (PROVENTIL HFA;VENTOLIN HFA) 108 (90 Base) MCG/ACT inhaler Inhale 1 puff into the lungs every 6 (six) hours as needed for wheezing or shortness of breath.   Yes [provider]  amLODipine (NORVASC) 5 MG tablet Take 5 mg by mouth daily.   Yes [provider]  aspirin 81 MG tablet Take 81 mg by mouth every other day.    Yes [provider]  levothyroxine (SYNTHROID, LEVOTHROID) 25 MCG tablet Take 25 mcg by mouth daily before breakfast.   Yes  [provider]  montelukast (SINGULAIR) 10 MG tablet Take 10 mg by mouth daily.    Yes [provider]  Multiple Vitamin (MULTIVITAMIN WITH MINERALS) TABS tablet Take 1 tablet by mouth daily.   Yes [provider]  pantoprazole (PROTONIX) 40 MG tablet Take 40 mg by mouth 2 (two) times daily.   Yes [provider]  albuterol (PROVENTIL) (5 MG/ML) 0.5% nebulizer solution Take 0.5 mLs (2.5 mg total) by nebulization every 6 (six) hours as needed for wheezing or shortness of breath. 12/21/16   Gwyneth Sprout, MD  Vitamin D, Ergocalciferol, (DRISDOL) 50000 units CAPS capsule Take 50,000 Units by mouth every 7 (seven) days. Tuesday of each week    [provider]    Family History No family history on file.  Social History Social History   Tobacco Use  . Smoking status: Never Smoker  . Smokeless tobacco: Never Used  Substance Use Topics  . Alcohol use: No  . Drug use: No     Allergies   Fish allergy; Fish oil; and Lisinopril   Review of Systems Review of Systems  Constitutional: Negative for chills and fever.  HENT: Negative for facial swelling and sore throat.   Eyes: Positive for redness. Negative for photophobia, pain, discharge, itching and visual disturbance.  Respiratory: Negative for shortness of breath.   Cardiovascular: Negative for chest pain.  Gastrointestinal: Negative for abdominal pain, nausea and vomiting.  Genitourinary: Negative for dysuria.  Musculoskeletal: Negative for back pain.  Skin: Negative for rash and wound.  Neurological: Negative for headaches.  Psychiatric/Behavioral: The patient is not nervous/anxious.      Physical Exam Updated Vital Signs BP (!) 162/76 (BP Location: Left Arm)   Pulse 94   Temp 98.1 F (36.7 C) (Oral)   Resp 18   Ht 5\' 6"  (1.676 m)   Wt 68.5 kg (151 lb)   SpO2 99%   BMI 24.37 kg/m   Physical Exam  Constitutional: She appears well-developed and well-nourished. No  distress.  HENT:  Head: Normocephalic and atraumatic.  Mouth/Throat: Oropharynx is clear and moist. No oropharyngeal exudate.  Eyes: Pupils are equal, round, and reactive to light. EOM are normal. Right eye exhibits no discharge. Left eye exhibits no discharge. Right conjunctiva has a hemorrhage. No scleral icterus.  Sub-conjunctival hemorrhage with hematoma in the right eye, Tono-Pen pressures average 17; no pain or difficulty with EOMs; pupils are equal, round, reactive; no consensual photophobia  Neck: Normal range of motion. Neck supple. No thyromegaly present.  Cardiovascular: Normal rate, regular rhythm, normal heart sounds and intact distal pulses. Exam reveals no gallop and no friction rub.  No murmur heard. Pulmonary/Chest: Effort normal and breath sounds normal. No stridor. No respiratory distress. She has no wheezes. She has no rales.  Abdominal: Soft. Bowel sounds are normal. She exhibits no distension. There is no tenderness. There is no rebound and no guarding.  Musculoskeletal: She exhibits no edema.  Lymphadenopathy:    She has no cervical adenopathy.  Neurological: She is alert. Coordination normal.  Skin: Skin is warm and dry. No rash noted. She is not diaphoretic. No pallor.  Psychiatric: She has a normal mood and affect.  Nursing note and vitals reviewed.    ED Treatments / Results  Labs (all labs ordered are listed, but only abnormal results are displayed) Labs Reviewed - No data to display  EKG None  Radiology No results found.  Procedures Procedures (including critical care time)  Medications Ordered in ED Medications  tetracaine (PONTOCAINE) 0.5 % ophthalmic solution 2 drop (2 drops Right Eye Given 05/17/17 1505)     Initial Impression / Assessment and Plan / ED Course  I have reviewed the triage vital signs and the nursing notes.  Pertinent labs & imaging results that were available during my care of the patient were reviewed by me and considered in  my medical decision making (see chart for details).     Patient with subconjunctival hemorrhage in the right eye.  She denies any vision changes or pain.  Intraocular pressures within normal limits.  She is not on anticoagulants.  Follow-up to optometrist for recheck.  Return precautions discussed.  Patient understands and agrees with plan.  Patient vitals stable throughout ED course and discharged in satisfactory condition.  Patient also evaluated by Dr. Denton LankSteinl who had the patient's management and agrees with plan.  Final Clinical Impressions(s) / ED Diagnoses   Final diagnoses:  Subconjunctival hemorrhage of right eye    ED Discharge Orders    None       Emi HolesLaw, Selinda Korzeniewski M, PA-C 05/17/17 1515    Cathren LaineSteinl, Kevin, MD 05/17/17 1537

## 2017-05-17 NOTE — Discharge Instructions (Addendum)
Please follow-up with your eye doctor in 3-4 days for recheck.  Please return the emergency department if you develop any new or worsening symptoms including vision change, eye pain, or any other new or concerning symptom.

## 2017-05-17 NOTE — ED Triage Notes (Signed)
Pt reports she felt a sudden pain in her R eye, looks like blood has pooled at the bottom of it. Pt denies visual loss or blurred vision.

## 2017-06-17 ENCOUNTER — Encounter: Payer: Self-pay | Admitting: Podiatry

## 2017-06-17 ENCOUNTER — Ambulatory Visit (INDEPENDENT_AMBULATORY_CARE_PROVIDER_SITE_OTHER): Payer: Medicare Other | Admitting: Podiatry

## 2017-06-17 DIAGNOSIS — M79671 Pain in right foot: Secondary | ICD-10-CM | POA: Diagnosis not present

## 2017-06-17 DIAGNOSIS — M79672 Pain in left foot: Secondary | ICD-10-CM | POA: Diagnosis not present

## 2017-06-17 DIAGNOSIS — B351 Tinea unguium: Secondary | ICD-10-CM | POA: Diagnosis not present

## 2017-06-17 NOTE — Patient Instructions (Signed)
Seen for hypertrophic nails. All nails debrided. Return in 3 months or as needed.  

## 2017-06-17 NOTE — Progress Notes (Signed)
Subjective: 77 y.o. year old female patient presents complaining of painful nails. Patient requests toe nails trimmed.   Objective: Dermatologic: Thick yellow deformed nails x 10. Vascular: Pedal pulses are all palpable. Orthopedic: Normal findings. Neurologic: All epicritic and tactile sensations grossly intact.  Assessment: Dystrophic mycotic nails x 10. Painful feet.  Treatment: All mycotic nails debrided.  Return in 3 months or as needed.

## 2017-09-18 ENCOUNTER — Ambulatory Visit: Payer: Medicare Other | Admitting: Podiatry

## 2017-10-20 ENCOUNTER — Encounter: Payer: Self-pay | Admitting: Podiatry

## 2017-10-20 ENCOUNTER — Ambulatory Visit: Payer: Medicare Other | Admitting: Podiatry

## 2017-10-20 DIAGNOSIS — M79671 Pain in right foot: Secondary | ICD-10-CM

## 2017-10-20 DIAGNOSIS — M79672 Pain in left foot: Secondary | ICD-10-CM

## 2017-10-20 DIAGNOSIS — B351 Tinea unguium: Secondary | ICD-10-CM | POA: Diagnosis not present

## 2017-10-20 NOTE — Patient Instructions (Signed)
Seen for hypertrophic nails. All nails debrided. Return in 3 months or as needed.  

## 2017-10-20 NOTE — Progress Notes (Signed)
Subjective: 77 y.o. year old female patient presents requesting the toe nail be taken off on right great toe that had injured nail and came off a half way when she was seen in May 2019.  Objective: Dermatologic: Thick yellow deformed nails x 10.  Dystrophic and uneven nail plate with vertical nail growth right great toe. Vascular: Pedal pulses are all palpable. Orthopedic: Contracted lesser digits with Valgus deformity and bunion bilateral. Neurologic: All epicritic and tactile sensations grossly intact.  Assessment: Dystrophic mycotic nails x 10. Painful nail right great toe.  Treatment: All mycotic nails debrided.  Return in 3 months or as needed.

## 2018-01-19 ENCOUNTER — Ambulatory Visit: Payer: Medicare Other | Admitting: Podiatry

## 2018-02-03 DIAGNOSIS — E538 Deficiency of other specified B group vitamins: Secondary | ICD-10-CM

## 2018-02-03 HISTORY — DX: Deficiency of other specified B group vitamins: E53.8

## 2018-04-03 ENCOUNTER — Inpatient Hospital Stay (HOSPITAL_BASED_OUTPATIENT_CLINIC_OR_DEPARTMENT_OTHER)
Admission: EM | Admit: 2018-04-03 | Discharge: 2018-04-06 | DRG: 854 | Disposition: A | Discharge status: Home / Self Care | Payer: Medicare Other | Attending: Internal Medicine | Admitting: Internal Medicine

## 2018-04-03 ENCOUNTER — Emergency Department (HOSPITAL_BASED_OUTPATIENT_CLINIC_OR_DEPARTMENT_OTHER): Payer: Medicare Other

## 2018-04-03 ENCOUNTER — Other Ambulatory Visit: Payer: Self-pay

## 2018-04-03 ENCOUNTER — Encounter (HOSPITAL_BASED_OUTPATIENT_CLINIC_OR_DEPARTMENT_OTHER): Payer: Self-pay | Admitting: *Deleted

## 2018-04-03 DIAGNOSIS — Z79899 Other long term (current) drug therapy: Secondary | ICD-10-CM

## 2018-04-03 DIAGNOSIS — Z7982 Long term (current) use of aspirin: Secondary | ICD-10-CM | POA: Diagnosis not present

## 2018-04-03 DIAGNOSIS — D649 Anemia, unspecified: Secondary | ICD-10-CM | POA: Diagnosis present

## 2018-04-03 DIAGNOSIS — Q211 Atrial septal defect: Secondary | ICD-10-CM | POA: Diagnosis not present

## 2018-04-03 DIAGNOSIS — E039 Hypothyroidism, unspecified: Secondary | ICD-10-CM | POA: Diagnosis present

## 2018-04-03 DIAGNOSIS — B961 Klebsiella pneumoniae [K. pneumoniae] as the cause of diseases classified elsewhere: Secondary | ICD-10-CM | POA: Diagnosis present

## 2018-04-03 DIAGNOSIS — D631 Anemia in chronic kidney disease: Secondary | ICD-10-CM | POA: Diagnosis present

## 2018-04-03 DIAGNOSIS — B9620 Unspecified Escherichia coli [E. coli] as the cause of diseases classified elsewhere: Secondary | ICD-10-CM | POA: Diagnosis present

## 2018-04-03 DIAGNOSIS — R1013 Epigastric pain: Secondary | ICD-10-CM | POA: Diagnosis present

## 2018-04-03 DIAGNOSIS — I10 Essential (primary) hypertension: Secondary | ICD-10-CM

## 2018-04-03 DIAGNOSIS — Z888 Allergy status to other drugs, medicaments and biological substances status: Secondary | ICD-10-CM | POA: Diagnosis not present

## 2018-04-03 DIAGNOSIS — K8001 Calculus of gallbladder with acute cholecystitis with obstruction: Secondary | ICD-10-CM | POA: Diagnosis not present

## 2018-04-03 DIAGNOSIS — R7989 Other specified abnormal findings of blood chemistry: Secondary | ICD-10-CM

## 2018-04-03 DIAGNOSIS — K219 Gastro-esophageal reflux disease without esophagitis: Secondary | ICD-10-CM | POA: Diagnosis present

## 2018-04-03 DIAGNOSIS — I248 Other forms of acute ischemic heart disease: Secondary | ICD-10-CM | POA: Diagnosis present

## 2018-04-03 DIAGNOSIS — I272 Pulmonary hypertension, unspecified: Secondary | ICD-10-CM | POA: Diagnosis present

## 2018-04-03 DIAGNOSIS — Z79890 Hormone replacement therapy (postmenopausal): Secondary | ICD-10-CM

## 2018-04-03 DIAGNOSIS — K8020 Calculus of gallbladder without cholecystitis without obstruction: Secondary | ICD-10-CM

## 2018-04-03 DIAGNOSIS — J45909 Unspecified asthma, uncomplicated: Secondary | ICD-10-CM | POA: Diagnosis present

## 2018-04-03 DIAGNOSIS — I129 Hypertensive chronic kidney disease with stage 1 through stage 4 chronic kidney disease, or unspecified chronic kidney disease: Secondary | ICD-10-CM | POA: Diagnosis present

## 2018-04-03 DIAGNOSIS — N182 Chronic kidney disease, stage 2 (mild): Secondary | ICD-10-CM | POA: Diagnosis present

## 2018-04-03 DIAGNOSIS — R778 Other specified abnormalities of plasma proteins: Secondary | ICD-10-CM

## 2018-04-03 DIAGNOSIS — Z91013 Allergy to seafood: Secondary | ICD-10-CM

## 2018-04-03 DIAGNOSIS — Z882 Allergy status to sulfonamides status: Secondary | ICD-10-CM | POA: Diagnosis not present

## 2018-04-03 DIAGNOSIS — K819 Cholecystitis, unspecified: Secondary | ICD-10-CM | POA: Diagnosis not present

## 2018-04-03 DIAGNOSIS — I452 Bifascicular block: Secondary | ICD-10-CM | POA: Diagnosis present

## 2018-04-03 DIAGNOSIS — A419 Sepsis, unspecified organism: Secondary | ICD-10-CM

## 2018-04-03 DIAGNOSIS — K810 Acute cholecystitis: Secondary | ICD-10-CM

## 2018-04-03 DIAGNOSIS — R9431 Abnormal electrocardiogram [ECG] [EKG]: Secondary | ICD-10-CM | POA: Diagnosis not present

## 2018-04-03 DIAGNOSIS — K8309 Other cholangitis: Secondary | ICD-10-CM | POA: Diagnosis present

## 2018-04-03 DIAGNOSIS — Z8249 Family history of ischemic heart disease and other diseases of the circulatory system: Secondary | ICD-10-CM

## 2018-04-03 DIAGNOSIS — R8271 Bacteriuria: Secondary | ICD-10-CM | POA: Diagnosis present

## 2018-04-03 DIAGNOSIS — I340 Nonrheumatic mitral (valve) insufficiency: Secondary | ICD-10-CM | POA: Diagnosis not present

## 2018-04-03 DIAGNOSIS — I361 Nonrheumatic tricuspid (valve) insufficiency: Secondary | ICD-10-CM | POA: Diagnosis not present

## 2018-04-03 HISTORY — DX: Sepsis, unspecified organism: A41.9

## 2018-04-03 HISTORY — DX: Other specified abnormal findings of blood chemistry: R79.89

## 2018-04-03 HISTORY — DX: Calculus of gallbladder without cholecystitis without obstruction: K80.20

## 2018-04-03 HISTORY — DX: Other specified abnormalities of plasma proteins: R77.8

## 2018-04-03 HISTORY — DX: Acute cholecystitis: K81.0

## 2018-04-03 LAB — CBC WITH DIFFERENTIAL/PLATELET
ABS IMMATURE GRANULOCYTES: 0.03 10*3/uL (ref 0.00–0.07)
Basophils Absolute: 0 10*3/uL (ref 0.0–0.1)
Basophils Relative: 0 %
EOS PCT: 2 %
Eosinophils Absolute: 0.2 10*3/uL (ref 0.0–0.5)
HEMATOCRIT: 36.3 % (ref 36.0–46.0)
HEMOGLOBIN: 10.9 g/dL — AB (ref 12.0–15.0)
Immature Granulocytes: 0 %
Lymphocytes Relative: 8 %
Lymphs Abs: 0.7 10*3/uL (ref 0.7–4.0)
MCH: 24.5 pg — ABNORMAL LOW (ref 26.0–34.0)
MCHC: 30 g/dL (ref 30.0–36.0)
MCV: 81.6 fL (ref 80.0–100.0)
MONO ABS: 0.1 10*3/uL (ref 0.1–1.0)
Monocytes Relative: 1 %
Neutro Abs: 8.8 10*3/uL — ABNORMAL HIGH (ref 1.7–7.7)
Neutrophils Relative %: 89 %
Platelets: 294 10*3/uL (ref 150–400)
RBC: 4.45 MIL/uL (ref 3.87–5.11)
RDW: 16 % — ABNORMAL HIGH (ref 11.5–15.5)
WBC: 9.9 10*3/uL (ref 4.0–10.5)
nRBC: 0 % (ref 0.0–0.2)

## 2018-04-03 LAB — COMPREHENSIVE METABOLIC PANEL
ALBUMIN: 3.6 g/dL (ref 3.5–5.0)
ALK PHOS: 288 U/L — AB (ref 38–126)
ALT: 149 U/L — ABNORMAL HIGH (ref 0–44)
AST: 570 U/L — ABNORMAL HIGH (ref 15–41)
Anion gap: 9 (ref 5–15)
BILIRUBIN TOTAL: 1.2 mg/dL (ref 0.3–1.2)
BUN: 18 mg/dL (ref 8–23)
CHLORIDE: 106 mmol/L (ref 98–111)
CO2: 22 mmol/L (ref 22–32)
CREATININE: 1 mg/dL (ref 0.44–1.00)
Calcium: 8.8 mg/dL — ABNORMAL LOW (ref 8.9–10.3)
GFR calc Af Amer: >60 mL/min (ref >60)
GFR, EST NON AFRICAN AMERICAN: 54 mL/min — AB (ref >60)
GLUCOSE: 150 mg/dL — AB (ref 70–99)
POTASSIUM: 3.6 mmol/L (ref 3.5–5.1)
Sodium: 137 mmol/L (ref 135–145)
Total Protein: 7.1 g/dL (ref 6.5–8.1)

## 2018-04-03 LAB — URINALYSIS, ROUTINE W REFLEX MICROSCOPIC
Bilirubin Urine: NEGATIVE
GLUCOSE, UA: NEGATIVE mg/dL
Hgb urine dipstick: NEGATIVE
Ketones, ur: NEGATIVE mg/dL
Leukocytes,Ua: NEGATIVE
Nitrite: POSITIVE — AB
PROTEIN: NEGATIVE mg/dL
SPECIFIC GRAVITY, URINE: 1.015 (ref 1.005–1.030)
pH: 6 (ref 5.0–8.0)

## 2018-04-03 LAB — TROPONIN I
TROPONIN I: 0.03 ng/mL — AB (ref <0.03)
Troponin I: 0.05 ng/mL (ref <0.03)

## 2018-04-03 LAB — PROTIME-INR
INR: 1.2 (ref 0.8–1.2)
Prothrombin Time: 15.5 seconds — ABNORMAL HIGH (ref 11.4–15.2)

## 2018-04-03 LAB — URINALYSIS, MICROSCOPIC (REFLEX)

## 2018-04-03 LAB — PROCALCITONIN: Procalcitonin: 60.34 ng/mL

## 2018-04-03 LAB — TYPE AND SCREEN
ABO/RH(D): AB POS
Antibody Screen: NEGATIVE

## 2018-04-03 LAB — APTT: aPTT: 32 seconds (ref 24–36)

## 2018-04-03 LAB — LIPASE, BLOOD: LIPASE: 57 U/L — AB (ref 11–51)

## 2018-04-03 LAB — LACTIC ACID, PLASMA: Lactic Acid, Venous: 2.4 mmol/L (ref 0.5–1.9)

## 2018-04-03 MED ORDER — LEVOTHYROXINE SODIUM 25 MCG PO TABS
25.0000 ug | ORAL_TABLET | Freq: Every day | ORAL | Status: DC
  Administered 2018-04-04 – 2018-04-06 (×3): 25 ug via ORAL
  Filled 2018-04-03 (×3): qty 1

## 2018-04-03 MED ORDER — ONDANSETRON HCL 4 MG PO TABS: 4.0000 mg | ORAL_TABLET | Freq: Four times a day (QID) | ORAL | Status: DC | PRN

## 2018-04-03 MED ORDER — SODIUM CHLORIDE 0.9 % IV BOLUS
1000.0000 mL | Freq: Once | INTRAVENOUS | Status: AC
  Administered 2018-04-03: 1000 mL via INTRAVENOUS

## 2018-04-03 MED ORDER — LEVALBUTEROL HCL 1.25 MG/0.5ML IN NEBU: 1.2500 mg | INHALATION_SOLUTION | Freq: Four times a day (QID) | RESPIRATORY_TRACT | Status: DC

## 2018-04-03 MED ORDER — SODIUM CHLORIDE 0.9 % IV SOLN
INTRAVENOUS | Status: DC
  Administered 2018-04-03 – 2018-04-04 (×2): via INTRAVENOUS

## 2018-04-03 MED ORDER — IBUPROFEN 200 MG PO TABS
200.0000 mg | ORAL_TABLET | Freq: Four times a day (QID) | ORAL | Status: DC | PRN
  Administered 2018-04-05 (×2): 200 mg via ORAL
  Filled 2018-04-03 (×2): qty 1

## 2018-04-03 MED ORDER — LEVALBUTEROL HCL 1.25 MG/0.5ML IN NEBU
1.2500 mg | INHALATION_SOLUTION | Freq: Four times a day (QID) | RESPIRATORY_TRACT | Status: DC | PRN
  Administered 2018-04-05 – 2018-04-06 (×2): 1.25 mg via RESPIRATORY_TRACT
  Filled 2018-04-03 (×3): qty 0.5

## 2018-04-03 MED ORDER — MORPHINE SULFATE (PF) 2 MG/ML IV SOLN
0.5000 mg | INTRAVENOUS | Status: DC | PRN
  Administered 2018-04-04: 0.5 mg via INTRAVENOUS
  Filled 2018-04-03: qty 1

## 2018-04-03 MED ORDER — SENNOSIDES-DOCUSATE SODIUM 8.6-50 MG PO TABS: 1.0000 | ORAL_TABLET | Freq: Every evening | ORAL | Status: DC | PRN

## 2018-04-03 MED ORDER — ACETAMINOPHEN 325 MG PO TABS
650.0000 mg | ORAL_TABLET | Freq: Once | ORAL | Status: AC
  Administered 2018-04-03: 650 mg via ORAL
  Filled 2018-04-03: qty 2

## 2018-04-03 MED ORDER — AMLODIPINE BESYLATE 5 MG PO TABS: 5.0000 mg | ORAL_TABLET | Freq: Every day | ORAL | Status: DC

## 2018-04-03 MED ORDER — PIPERACILLIN-TAZOBACTAM 3.375 G IVPB 30 MIN
3.3750 g | Freq: Once | INTRAVENOUS | Status: AC
  Administered 2018-04-03: 3.375 g via INTRAVENOUS
  Filled 2018-04-03 (×2): qty 50

## 2018-04-03 MED ORDER — PANTOPRAZOLE SODIUM 40 MG PO TBEC
40.0000 mg | DELAYED_RELEASE_TABLET | Freq: Every day | ORAL | Status: DC | PRN
  Administered 2018-04-04: 40 mg via ORAL
  Filled 2018-04-03: qty 1

## 2018-04-03 MED ORDER — LORATADINE 10 MG PO TABS
10.0000 mg | ORAL_TABLET | Freq: Every day | ORAL | Status: DC
  Administered 2018-04-04 – 2018-04-06 (×3): 10 mg via ORAL
  Filled 2018-04-03 (×3): qty 1

## 2018-04-03 MED ORDER — NITROGLYCERIN 0.4 MG SL SUBL: 0.4000 mg | SUBLINGUAL_TABLET | SUBLINGUAL | Status: DC | PRN

## 2018-04-03 MED ORDER — ONDANSETRON HCL 4 MG/2ML IJ SOLN
4.0000 mg | Freq: Once | INTRAMUSCULAR | Status: AC
Start: 2018-04-03 — End: 2018-04-03
  Administered 2018-04-03: 4 mg via INTRAVENOUS
  Filled 2018-04-03: qty 2

## 2018-04-03 MED ORDER — ASPIRIN EC 81 MG PO TBEC
81.0000 mg | DELAYED_RELEASE_TABLET | Freq: Every day | ORAL | Status: DC
  Administered 2018-04-03 – 2018-04-06 (×4): 81 mg via ORAL
  Filled 2018-04-03 (×4): qty 1

## 2018-04-03 MED ORDER — PIPERACILLIN-TAZOBACTAM IN DEX 2-0.25 GM/50ML IV SOLN
2.2500 g | Freq: Once | INTRAVENOUS | Status: DC
  Filled 2018-04-03: qty 50

## 2018-04-03 MED ORDER — ONDANSETRON HCL 4 MG/2ML IJ SOLN: 4.0000 mg | Freq: Four times a day (QID) | INTRAMUSCULAR | Status: DC | PRN

## 2018-04-03 MED ORDER — ADULT MULTIVITAMIN W/MINERALS CH
1.0000 | ORAL_TABLET | Freq: Every day | ORAL | Status: DC
  Administered 2018-04-05 – 2018-04-06 (×2): 1 via ORAL
  Filled 2018-04-03 (×2): qty 1

## 2018-04-03 MED ORDER — HYDRALAZINE HCL 20 MG/ML IJ SOLN: 5.0000 mg | INTRAMUSCULAR | Status: DC | PRN

## 2018-04-03 MED ORDER — PIPERACILLIN-TAZOBACTAM 3.375 G IVPB
3.3750 g | Freq: Three times a day (TID) | INTRAVENOUS | Status: DC
  Administered 2018-04-03 – 2018-04-06 (×8): 3.375 g via INTRAVENOUS
  Filled 2018-04-03 (×8): qty 50

## 2018-04-03 NOTE — Progress Notes (Signed)
received report  From Dennard Nip RN at 2209. Patient transfered to the floor. Patients Vitals are currently stable and patient complains of no pain or any discomfort. Will continue to monitor.

## 2018-04-03 NOTE — ED Notes (Signed)
Pt returned from US

## 2018-04-03 NOTE — ED Notes (Signed)
ED Provider at bedside. 

## 2018-04-03 NOTE — Progress Notes (Signed)
Notified per lab that patient has critical lactic acid value of 2.4. MD on call notified, patient currently in the process of being transferred to telemetry unit for cardiac monitoring. Marcelle Overlie, RN

## 2018-04-03 NOTE — Progress Notes (Signed)
Pharmacy Antibiotic Note  Dominique Ochoa is a 78 y.o. female admitted on 04/03/2018 with Cholecystitis.  Pharmacy has been consulted for Zosyn dosing.  Plan: Zosyn 3.375g IV Q8H infused over 4hrs. Dosage remains stable and need for further dosage adjustment appears unlikely at present.  Pharmacy will sign off at this time.  Please reconsult if a change in clinical status warrants re-evaluation of dosage.   Height: 5\' 5"  (165.1 cm) Weight: 169 lb 1.5 oz (76.7 kg) IBW/kg (Calculated) : 57  Temp (24hrs), Avg:99.8 F (37.7 C), Min:99.3 F (37.4 C), Max:100.8 F (38.2 C)  Recent Labs  Lab 04/03/18 1429  WBC 9.9  CREATININE 1.00    Estimated Creatinine Clearance: 46.7 mL/min (by C-G formula based on SCr of 1 mg/dL).    Allergies  Allergen Reactions  . Fish Allergy Other (See Comments)    Makes her weak  . Fish Oil Other (See Comments) and Swelling    MD orders  . Lisinopril Swelling    Facial swelling  . Sulfamethoxazole-Trimethoprim Itching and Swelling    Facial swelling    Thank you for allowing pharmacy to be a part of this patient's care.  Lynann Beaver PharmD, BCPS Pager [phone] 04/03/2018 8:44 PM

## 2018-04-03 NOTE — Progress Notes (Signed)
Notified per lab that patient has critical troponin level of 0.05. On call MD notified, patient currently in the process of being transferred to telemetry floor for cardiac monitoring, report has been given and patient is being transferred to room 1429. Marcelle Overlie, RN

## 2018-04-03 NOTE — Consult Note (Addendum)
Re:   Dominique Ochoa DOB:   1941/01/17 MRN:   [id number]  Chief Complaint Abdominal pain  ASSESEMENT AND PLAN: 1.  Cholecystitis, cholelithiasis  With elevated LFT's.  With normal T. Bili - I think that it reasonable to proceed with lap chole, check a cholangiogram, and decide if there is any problem with the common bile duct.  I discussed with the patient the indications and risks of gall bladder surgery.  The primary risks of gall bladder surgery include, but are not limited to, bleeding, infection, common bile duct injury, and open surgery.  There is also the risk that the patient may have continued symptoms after surgery.  We discussed the typical post-operative recovery course. I tried to answer the patient's questions.  I gave the patient literature about gall bladder surgery.  2.  GERD 3.  CKD 4.  HTN x 1 year 5.  Asthma  Uses an occasional inhaler 6.  Thyroid replacement  7.  Foramen Bochdalek hernia  Chief Complaint  Patient presents with  . Emesis  . Chills   PHYSICIAN REQUESTING CONSULTATION:    Martinique Robinson, PA, [facility]  HISTORY OF PRESENT ILLNESS: Dominique Ochoa is a 78 y.o. (DOB: 01-09-1941)  AA female whose primary care physician is Drake Leach, MD. [facility])  Her son, Dominique Ochoa, daughter Mychele, Seyller, Son Haani Bakula and daughter in law Amand Lemoine are at the bed side.   The patient had some gastroesophageal reflux.  But she has had little other GI symptoms.  She has no known stomach, liver, pancreas, or colon disease.  Her last colonoscopy was about 15 years ago.  She has had no prior abdominal surgery.  She developed severe epigastric pain around 1030 this morning.  She says she has some shaking.  She vomited twice.  She went to the [facility] for evaluation.  [place] of gall bladder - 04/03/2018 - 1. Multiple gallstones with positive sonographic Murphy and small amount of pericholecystic fluid concerning  for an acute cholecystitis.  2. Dilated common bile duct measuring up to 8 mm. If ductal stone or obstruction is suspected, further evaluation with MRCP could be obtained. WBC - 9,900 - 04/03/2018 T. Bili - 1.2,  Alk Phos - 288, AST - 570 - 04/03/2018    Past Medical History:  Diagnosis Date  . Asthma   . GERD (gastroesophageal reflux disease)   . Hypertension   . Thyroid disease      History reviewed. No pertinent surgical history.    Current Facility-Administered Medications  Medication Dose Route Frequency Provider Last Rate Last Dose  . [START ON 04/04/2018] amLODipine (NORVASC) tablet 5 mg  5 mg Oral Daily Ivor Costa, MD      . aspirin EC tablet 81 mg  81 mg Oral Daily Ivor Costa, MD      . hydrALAZINE (APRESOLINE) injection 5 mg  5 mg Intravenous Q2H PRN Ivor Costa, MD      . ibuprofen (ADVIL,MOTRIN) tablet 200 mg  200 mg Oral Q6H PRN Ivor Costa, MD      . levalbuterol Penne Lash) nebulizer solution 1.25 mg  1.25 mg Nebulization Q6H Ivor Costa, MD      . Derrill Memo ON 04/04/2018] levothyroxine (SYNTHROID, LEVOTHROID) tablet 25 mcg  25 mcg Oral QAC breakfast Ivor Costa, MD      . Derrill Memo ON 04/04/2018] loratadine (CLARITIN) tablet 10 mg  10 mg Oral Daily Ivor Costa, MD      .  morphine 2 MG/ML injection 0.5 mg  0.5 mg Intravenous Q4H PRN Ivor Costa, MD      . Derrill Memo ON 04/04/2018] multivitamin with minerals tablet 1 tablet  1 tablet Oral Daily Ivor Costa, MD      . ondansetron [facility]) tablet 4 mg  4 mg Oral Q6H PRN Ivor Costa, MD       Or  . ondansetron (ZOFRAN) injection 4 mg  4 mg Intravenous Q6H PRN Ivor Costa, MD      . pantoprazole (PROTONIX) EC tablet 40 mg  40 mg Oral Daily PRN Ivor Costa, MD      . piperacillin-tazobactam (ZOSYN) IVPB 3.375 g  3.375 g Intravenous Q8H Shade, Haze Justin, RPH      . senna-docusate (Senokot-S) tablet 1 tablet  1 tablet Oral QHS PRN Ivor Costa, MD          Allergies  Allergen Reactions  . Fish Allergy Other (See Comments)    Makes her weak  . Fish Oil  Other (See Comments) and Swelling    MD orders  . Lisinopril Swelling    Facial swelling  . Sulfamethoxazole-Trimethoprim Itching and Swelling    Facial swelling    REVIEW OF SYSTEMS: Skin:  No history of rash.  No history of abnormal moles. Infection:  No history of hepatitis or HIV.  No history of MRSA. Neurologic:  Near syncope - 03/2017 Cardiac:  HTN x 1 year.  No history of prior cardiac catheterization.  No history of seeing a cardiologist. Pulmonary:  Asthma.  Uses an occasional inhaler  Endocrine:  No diabetes. On thyroid replacement Gastrointestinal:  See HPI Urologic:  History of CKD, but creatinine 1.0 on 04/03/2018 Musculoskeletal:  No history of joint or back disease. Hematologic:  No bleeding disorder.  No history of anemia.  Not anticoagulated. Psycho-social:  The patient is oriented.   The patient has no obvious psychologic or social impairment to understanding our conversation and plan.  SOCIAL and FAMILY HISTORY: Widowed.  She lives with Dominique Ochoa, her son.  Her son Dominique Ochoa, daughter Dominique Ochoa, grandson Dominique Ochoa, son Dominique Ochoa, and daughter in law Dominique Ochoa are at the bed side.  Dominique Ochoa brother had gall bladder disease and surgery.  PHYSICAL EXAM: BP (!) 129/56 (BP Location: Right Arm)   Pulse (!) 102   Temp 99.4 F (37.4 C) (Oral)   Resp 15   Ht _0  (1.651 m)   Wt 76.7 kg   SpO2 97%   BMI 28.14 kg/m   General: Older AA F who is alert and generally healthy appearing for her age. Skin:  Inspection and palpation - no mass or rash. Eyes:  Conjunctiva and lids unremarkable.            Pupils are equal Ears, Nose, Mouth, and Throat:  Ears and nose unremarkable            Lips and teeth are unremarable. Neck: Supple. No mass, trachea midline.  No thyroid mass. Lymph Nodes:  No supraclavicular, cervical, or inguinal nodes. Lungs: Normal respiratory effort.  Clear to auscultation and symmetric breath sounds. Heart:  Palpation of the  heart is normal.            Auscultation: RRR. No murmur or rub.  Abdomen: Soft. No mass. No tenderness. No hernia.      Normal bowel sounds.  No abdominal scars.  She has some excess skin. Rectal: Not done. Musculoskeletal:  Good muscle strength and ROM  in upper  and lower extremities.  Neurologic:  Grossly intact to motor and sensory function. Psychiatric: Normal judgement and insight. Behavior is normal.            Oriented to time, person, place.   DATA REVIEWED, COUNSELING AND COORDINATION OF CARE: Epic notes reviewed. Counseling and coordination of care exceeded more than 50% of the time spent with patient. Total time spent with patient and charting: 50 minutes  Alphonsa Overall, MD,  [facility], [address]  [place], [place]    [place] Phone:  [phone] FAX:  [phone]

## 2018-04-03 NOTE — ED Notes (Signed)
Patient transported to X-ray 

## 2018-04-03 NOTE — ED Provider Notes (Signed)
[facility] EMERGENCY DEPARTMENT Provider Note   CSN: [id number] Arrival date & time: 04/03/18  1349    History   Chief Complaint Chief Complaint  Patient presents with  . Emesis  . Chills    HPI Dominique Ochoa is a 78 y.o. female w PMHx GERD, CKD, HTn, asthma, presenting to the ED with complaint of epigastric abdominal pain that began this morning. Pt states she began having pain with belching. Soon after she developed nausea with NBNB emesis. The pain comes and goes. Has assoc chills. No assoc fever, diarrhea, constipation, urinary sx, CP, SOB. No hx abdominal surgeries. She took gaviscon without relief.      The history is provided by the patient.    Past Medical History:  Diagnosis Date  . Asthma   . GERD (gastroesophageal reflux disease)   . Hypertension   . Thyroid disease     Patient Active Problem List   Diagnosis Date Noted  . Acute cholecystitis 04/03/2018  . Near syncope 04/20/2017  . Normocytic anemia 04/20/2017  . 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 Injury of right toe, sequela 04/20/2017  . Pain in the chest 09/16/2015  . HTN (hypertension) 09/16/2015  . Hypothyroidism 09/16/2015  . Asthma 09/16/2015  . GERD (gastroesophageal reflux disease) 09/16/2015  . Hyperglycemia 09/16/2015  . Abnormal finding on chest xray 09/16/2015    History reviewed. No pertinent surgical history.   OB History   No obstetric history on file.      Home Medications    Prior to Admission medications   Medication Sig Start Date End Date Taking? Authorizing Provider  albuterol (PROVENTIL HFA;VENTOLIN HFA) 108 (90 Base) MCG/ACT inhaler Inhale 1 puff into the lungs every 6 (six) hours as needed for wheezing or shortness of breath.   Yes [provider]  amLODipine (NORVASC) 5 MG tablet Take 5 mg by mouth daily.   Yes [provider]  cetirizine (ZYRTEC) 10 MG tablet Take 10 mg by mouth daily.   Yes [provider]    levothyroxine (SYNTHROID, LEVOTHROID) 25 MCG tablet Take 25 mcg by mouth daily before breakfast.   Yes [provider]  montelukast (SINGULAIR) 10 MG tablet Take 10 mg by mouth daily.    Yes [provider]  Vitamin D, Ergocalciferol, (DRISDOL) 50000 units CAPS capsule Take 50,000 Units by mouth every 7 (seven) days. Tuesday of each week   Yes [provider]  albuterol (PROVENTIL) (5 MG/ML) 0.5% nebulizer solution Take 0.5 mLs (2.5 mg total) by nebulization every 6 (six) hours as needed for wheezing or shortness of breath. 12/21/16   Blanchie Dessert, MD  aspirin 81 MG tablet Take 81 mg by mouth every other day.     [provider]  Multiple Vitamin (MULTIVITAMIN WITH MINERALS) TABS tablet Take 1 tablet by mouth daily.    [provider]  pantoprazole (PROTONIX) 40 MG tablet Take 40 mg by mouth 2 (two) times daily.    [provider]    Family History No family history on file.  Social History Social History   Tobacco Use  . Smoking status: Never Smoker  . Smokeless tobacco: Never Used  Substance Use Topics  . Alcohol use: No  . Drug use: No     Allergies   Fish allergy; Fish oil; and Lisinopril   Review of Systems Review of Systems  Constitutional: Positive for chills. Negative for fever.  HENT: Negative for congestion.   Respiratory: Negative for cough  and shortness of breath.   Cardiovascular: Negative for chest pain.  Gastrointestinal: Positive for abdominal pain, nausea and vomiting. Negative for constipation and diarrhea.  Genitourinary: Negative for dysuria and frequency.  All other systems reviewed and are negative.    Physical Exam Updated Vital Signs BP 121/66   Pulse 96   Temp 99.3 F (37.4 C) (Oral)   Resp (!) 26   Ht '5\' 6"'$  (1.676 m)   Wt 72.1 kg   SpO2 95%   BMI 25.66 kg/m   Physical Exam Vitals signs and nursing note reviewed.  Constitutional:      Appearance: She is well-developed. She is  not ill-appearing.     Comments: Pt retching upon entering the room and is initially tachycardic in the 120s, however once pt calm and not vomiting, HR 104. No acute distress.   HENT:     Head: Normocephalic and atraumatic.     Mouth/Throat:     Mouth: Mucous membranes are moist.  Eyes:     Conjunctiva/sclera: Conjunctivae normal.  Neck:     Musculoskeletal: Normal range of motion and neck supple.  Cardiovascular:     Rate and Rhythm: Regular rhythm. Tachycardia present.  Pulmonary:     Effort: Pulmonary effort is normal. No respiratory distress.     Breath sounds: Normal breath sounds.  Abdominal:     General: Bowel sounds are normal. There is no distension.     Palpations: Abdomen is soft.     Tenderness: There is no abdominal tenderness. There is no guarding or rebound.  Skin:    General: Skin is warm.  Neurological:     Mental Status: She is alert.  Psychiatric:        Behavior: Behavior normal.      ED Treatments / Results  Labs (all labs ordered are listed, but only abnormal results are displayed) Labs Reviewed  TROPONIN I - Abnormal; Notable for the following components:      Result Value   Troponin I 0.03 (*)    All other components within normal limits  CBC WITH DIFFERENTIAL/PLATELET - Abnormal; Notable for the following components:   Hemoglobin 10.9 (*)    MCH 24.5 (*)    RDW 16.0 (*)    Neutro Abs 8.8 (*)    All other components within normal limits  COMPREHENSIVE METABOLIC PANEL - Abnormal; Notable for the following components:   Glucose, Bld 150 (*)    Calcium 8.8 (*)    AST 570 (*)    ALT 149 (*)    Alkaline Phosphatase 288 (*)    GFR calc non Af Amer 54 (*)    All other components within normal limits  LIPASE, BLOOD - Abnormal; Notable for the following components:   Lipase 57 (*)    All other components within normal limits  URINALYSIS, ROUTINE W REFLEX MICROSCOPIC - Abnormal; Notable for the following components:   APPearance HAZY (*)     Nitrite POSITIVE (*)    All other components within normal limits  URINALYSIS, MICROSCOPIC (REFLEX) - Abnormal; Notable for the following components:   Bacteria, UA MANY (*)    All other components within normal limits  URINE CULTURE  CULTURE, BLOOD (ROUTINE X 2)  CULTURE, BLOOD (ROUTINE X 2)    EKG EKG Interpretation  Date/Time:  Friday April 03 2018 14:40:16 EST Ventricular Rate:  99 PR Interval:    QRS Duration: 140 QT Interval:  362 QTC Calculation: 465 R Axis:   -72 Text Interpretation:  Sinus rhythm RBBB and LAFB Probable left ventricular hypertrophy Confirmed by Jola Schmidt [phone]) on 04/03/2018 2:47:31 PM   Radiology Dg Abdomen Acute W/chest  Result Date: 04/03/2018 CLINICAL DATA:  Nausea and vomiting and chills since 10 a.m. today. EXAM: DG ABDOMEN ACUTE W/ 1V CHEST COMPARISON:  Chest radiographs dated 04/20/2017. FINDINGS: The cardiac silhouette remains borderline enlarged. No significant change in a small amount of linear scarring at the left lateral lung base. Minimal linear scarring and probable pleural thickening at the right lateral lung base. Otherwise, clear lungs. Normal bowel gas pattern without free peritoneal air. Bilateral pelvic phleboliths. Additional left mid abdominal probable phleboliths. No definite urinary tract calculi visualized. Mild thoracolumbar scoliosis and mild lumbar spine degenerative changes. Mild left glenohumeral degenerative spur formation. Superior migration of both humeral heads abutting the acromion on each side with bony remodeling of the acromion, compatible with large, chronic bilateral rotator cuff tears. IMPRESSION: No acute abnormality. Electronically Signed   By: Claudie Revering M.D.   On: 04/03/2018 15:19   US Abdomen Limited Ruq  Result Date: 04/03/2018 CLINICAL DATA:  Epigastric pain, nausea and vomiting EXAM: ULTRASOUND ABDOMEN LIMITED RIGHT UPPER QUADRANT COMPARISON:  None. FINDINGS: Gallbladder: Multiple shadowing stones measuring up  to 2 cm. Positive sonographic Murphy. Wall thickness of 3 mm. Small amount of pericholecystic fluid. Common bile duct: Diameter: Dilated at 8 mm. Liver: No focal lesion identified. Within normal limits in parenchymal echogenicity. Portal vein is patent on color Doppler imaging with normal direction of blood flow towards the liver. Possible small amount of fluid adjacent to the liver IMPRESSION: 1. Multiple gallstones with positive sonographic Murphy and small amount of pericholecystic fluid concerning for an acute cholecystitis. 2. Dilated common bile duct measuring up to 8 mm. If ductal stone or obstruction is suspected, further evaluation with MRCP could be obtained. Electronically Signed   By: Donavan Foil M.D.   On: 04/03/2018 16:46    Procedures Procedures (including critical care time)  Medications Ordered in ED Medications  sodium chloride 0.9 % bolus 1,000 mL (0 mLs Intravenous Stopped 04/03/18 1559)  ondansetron (ZOFRAN) injection 4 mg (4 mg Intravenous Given 04/03/18 1438)  piperacillin-tazobactam (ZOSYN) IVPB 3.375 g (0 g Intravenous Stopped 04/03/18 1639)  acetaminophen (TYLENOL) tablet 650 mg (650 mg Oral Given 04/03/18 1554)     Initial Impression / Assessment and Plan / ED Course  I have reviewed the triage vital signs and the nursing notes.  Pertinent labs & imaging results that were available during my care of the patient were reviewed by me and considered in my medical decision making (see chart for details).  Clinical Course as of Apr 03 1943  Fri Apr 03, 2018  1450 Pt discusssed with and evaluated by Dr. Venora Maples. Will obtain labs, xray, and provide IVF and antiemetics.    [JR]  1505 Acute transaminitis. Will order RUQ ultrasound with high suspicion for possible cholecystitis vs cholangitis. Empiric abx ordered.  Comprehensive metabolic panel(!) [JR]  9983 Patient reevaluated.  Reports improvement in nausea.   [JR]  3825 Consult to hospitalist, Dr. Lonny Prude, accepting admission.  Will consult gen surgery.   [JR]  [place] Dr. Lucia Gaskins with gen surgery. Will see patient when she arrives to [facility].   [JR]    Clinical Course User Index [JR] Liev Brockbank, [place] N, PA-C     Pt presenting with acute onset of epigastric abdominal pain with nausea and vomiting that began today.  No history of abdominal surgery, no diarrhea or constipation.  No urinary symptoms.  Arrival, patient is tachycardic with rectal temp of 100.8 F.  Normotensive.  Abdomen is soft and nontender.  IV fluids and Zofran ordered for symptom management.  Labs and x-ray ordered.  Patient discussed with evaluated by Dr. Venora Maples.  Patient with improvement in nausea.  Labs without leukocytosis.  CMP reveals normal renal function however new transaminitis with AST of 570, ALT 149 and alk phos of 288.  Total bili is 1.2.  Lipase unremarkable.  With acute changes in liver enzymes, right upper quadrant ultrasound ordered with concern for possible cholecystitis versus cholangitis.  Empiric antibiotics, Zosyn.  Ultrasound with cholelithiasis and findings consistent with cholecystitis.  There is also dilated common bile duct.  Heart rate improving.  Normotensive.  Patient admitted to hospital service at [facility].  Consult placed to Dr. Lucia Gaskins with general surgery, who evaluated patient upon arrival.  Patient is agreeable to plan and stable for transfer at this time.  Final Clinical Impressions(s) / ED Diagnoses   Final diagnoses:  Calculus of gallbladder with acute cholecystitis and obstruction    ED Discharge Orders    None       Deshannon Seide, [place] N, PA-C 04/03/18 1945    Jola Schmidt, MD 04/04/18 [phone]

## 2018-04-03 NOTE — Progress Notes (Signed)
Report given to Dow Adolph, RN at 2209. Patient has stable vitals and will be transferred to 1429 for cardiac monitoring. Marcelle Overlie, RN

## 2018-04-03 NOTE — H&P (Signed)
History and Physical    Dominique Ochoa YBF:[id number] DOB: 06-05-1940 DOA: 04/03/2018  Referring MD/NP/PA:   PCP: Albertina Senegal, MD   Patient coming from:  The patient is coming from home.  At baseline, pt is independent for most of ADL.        Chief Complaint: Abdominal pain, nausea, vomiting, fever, chills  HPI: Dominique Ochoa is a 78 y.o. female with medical history significant of hypertension, asthma, GERD, hypothyroidism, who presents with nausea, vomiting, abdominal pain, fever and chills  Patient states that her symptoms started in the morning, including nausea, vomiting and abdominal pain, and fever and chills.  She vomited twice with nonbloody non-biliary vomitus.  No diarrhea.  Her abdominal pain is located in epigastric area, moderate, dull, nonradiating.  She also reports fever and chills.  Patient states that she does not have chest pain, shortness breath, cough.  Denies symptoms of UTI or unilateral weakness. RN reports that pt had occasional PVC earlier.  ED Course: pt was found to have WBC 9.9, troponin 0.03, 0.05, lactic acid 2.4, abnormal liver function (ALP 288, AST 570, ALT 149, total bilirubin 1.2), urinalysis (hazy appearance, negative leukocyte, positive nitrite, WBC 0-5), electrolytes renal function okay, temperature 100.8, tachycardia, tachypnea, oxygen saturation 93% on room air.  X-ray of acute abdomen/chest is negative. Pt is admitted to telemetry bed as inpatient.  Dr. Ezzard Standing of general surgery was consulted.  US-RUQ: 1. Multiple gallstones with positive sonographic Murphy and small amount of pericholecystic fluid concerning for an acute cholecystitis. 2. Dilated common bile duct measuring up to 8 mm. If ductal stone or obstruction is suspected, further evaluation with MRCP could be obtained.  Review of Systems:   General: has fevers, chills, no body weight gain, has poor appetite, has fatigue HEENT: no blurry vision, hearing changes or sore throat Respiratory: no  dyspnea, coughing, wheezing CV: no chest pain, no palpitations GI: has nausea, vomiting, abdominal pain, no diarrhea, constipation GU: no dysuria, burning on urination, increased urinary frequency, hematuria  Ext: no leg edema Neuro: no unilateral weakness, numbness, or tingling, no vision change or hearing loss Skin: no rash, no skin tear. MSK: No muscle spasm, no deformity, no limitation of range of movement in spin Heme: No easy bruising.  Travel history: No recent long distant travel.  Allergy:  Allergies  Allergen Reactions  . Fish Allergy Other (See Comments)    Makes her weak  . Fish Oil Other (See Comments) and Swelling    MD orders  . Lisinopril Swelling    Facial swelling  . Sulfamethoxazole-Trimethoprim Itching and Swelling    Facial swelling    Past Medical History:  Diagnosis Date  . Asthma   . GERD (gastroesophageal reflux disease)   . Hypertension   . Thyroid disease     History reviewed. No pertinent surgical history.  Social History:  reports that she has never smoked. She has never used smokeless tobacco. She reports that she does not drink alcohol or use drugs.  Family History:  Family History  Problem Relation Age of Onset  . Hypertension Son      Prior to Admission medications   Medication Sig Start Date End Date Taking? Authorizing Provider  albuterol (PROVENTIL HFA;VENTOLIN HFA) 108 (90 Base) MCG/ACT inhaler Inhale 1 puff into the lungs every 6 (six) hours as needed for wheezing or shortness of breath.   Yes [provider]  amLODipine (NORVASC) 5 MG tablet Take 5 mg by mouth daily.   Yes [provider]  cetirizine (ZYRTEC) 10 MG tablet Take 10 mg by mouth daily.   Yes [provider]  levothyroxine (SYNTHROID, LEVOTHROID) 25 MCG tablet Take 25 mcg by mouth daily before breakfast.   Yes [provider]  montelukast (SINGULAIR) 10 MG tablet Take 10 mg by mouth daily.    Yes [provider]  Vitamin D,  Ergocalciferol, (DRISDOL) 50000 units CAPS capsule Take 50,000 Units by mouth every 7 (seven) days. Tuesday of each week   Yes [provider]  albuterol (PROVENTIL) (5 MG/ML) 0.5% nebulizer solution Take 0.5 mLs (2.5 mg total) by nebulization every 6 (six) hours as needed for wheezing or shortness of breath. 12/21/16   Gwyneth Sprout, MD  aspirin 81 MG tablet Take 81 mg by mouth every other day.     [provider]  Multiple Vitamin (MULTIVITAMIN WITH MINERALS) TABS tablet Take 1 tablet by mouth daily.    [provider]  pantoprazole (PROTONIX) 40 MG tablet Take 40 mg by mouth 2 (two) times daily.    [provider]    Physical Exam: Vitals:   04/03/18 1800 04/03/18 1830 04/03/18 1946 04/03/18 2256  BP: 131/62 121/66 (!) 129/56 (!) 113/54  Pulse: 98 96 (!) 102 93  Resp: (!) 21 (!) 26 15 (!) 25  Temp:   99.4 F (37.4 C) 99.7 F (37.6 C)  TempSrc:   Oral Oral  SpO2: 96% 95% 97% 98%  Weight:   76.7 kg   Height:    (1.651 m)    General: Not in acute distress HEENT:       Eyes: PERRL, EOMI, no scleral icterus.       ENT: No discharge from the ears and nose, no pharynx injection, no tonsillar enlargement.        Neck: No JVD, no bruit, no mass felt. Heme: No neck lymph node enlargement. Cardiac: S1/S2, RRR, No murmurs, No gallops or rubs. Respiratory: No rales, wheezing, rhonchi or rubs. GI: Soft, nondistended, mild tenderness in epigastric area, no rebound pain, no organomegaly, BS present. GU: No hematuria Ext: No pitting leg edema bilaterally. 2+DP/PT pulse bilaterally. Musculoskeletal: No joint deformities, No joint redness or warmth, no limitation of ROM in spin. Skin: No rashes.  Neuro: Alert, oriented X3, cranial nerves II-XII grossly intact, moves all extremities normally.  Psych: Patient is not psychotic, no suicidal or hemocidal ideation.  Labs on Admission: I have personally reviewed following labs and imaging  studies  CBC: Recent Labs  Lab 04/03/18 1429  WBC 9.9  NEUTROABS 8.8*  HGB 10.9*  HCT 36.3  MCV 81.6  PLT 294   Basic Metabolic Panel: Recent Labs  Lab 04/03/18 1429  NA 137  K 3.6  CL 106  CO2 22  GLUCOSE 150*  BUN 18  CREATININE 1.00  CALCIUM 8.8*   GFR: Estimated Creatinine Clearance: 46.7 mL/min (by C-G formula based on SCr of 1 mg/dL). Liver Function Tests: Recent Labs  Lab 04/03/18 1429  AST 570*  ALT 149*  ALKPHOS 288*  BILITOT 1.2  PROT 7.1  ALBUMIN 3.6   Recent Labs  Lab 04/03/18 1429  LIPASE 57*   No results for input(s): AMMONIA in the last 168 hours. Coagulation Profile: Recent Labs  Lab 04/03/18 2105  INR 1.2   Cardiac Enzymes: Recent Labs  Lab 04/03/18 1429 04/03/18 2105  TROPONINI 0.03* 0.05*   BNP (last 3 results) No results for input(s): PROBNP in the last 8760 hours. HbA1C: No results for input(s): HGBA1C in  the last 72 hours. CBG: No results for input(s): GLUCAP in the last 168 hours. Lipid Profile: No results for input(s): CHOL, HDL, LDLCALC, TRIG, CHOLHDL, LDLDIRECT in the last 72 hours. Thyroid Function Tests: No results for input(s): TSH, T4TOTAL, FREET4, T3FREE, THYROIDAB in the last 72 hours. Anemia Panel: No results for input(s): VITAMINB12, FOLATE, FERRITIN, TIBC, IRON, RETICCTPCT in the last 72 hours. Urine analysis:    Component Value Date/Time   COLORURINE YELLOW 04/03/2018 1513   APPEARANCEUR HAZY (A) 04/03/2018 1513   LABSPEC 1.015 04/03/2018 1513   PHURINE 6.0 04/03/2018 1513   GLUCOSEU NEGATIVE 04/03/2018 1513   HGBUR NEGATIVE 04/03/2018 1513   BILIRUBINUR NEGATIVE 04/03/2018 1513   KETONESUR NEGATIVE 04/03/2018 1513   PROTEINUR NEGATIVE 04/03/2018 1513   UROBILINOGEN 1.0 12/03/2013 2108   NITRITE POSITIVE (A) 04/03/2018 1513   LEUKOCYTESUR NEGATIVE 04/03/2018 1513   Sepsis Labs: @LABRCNTIP (procalcitonin:4,lacticidven:4) )No results found for this or any previous visit (from the past 240  hour(s)).   Radiological Exams on Admission: Dg Abdomen Acute W/chest  Result Date: 04/03/2018 CLINICAL DATA:  Nausea and vomiting and chills since 10 a.m. today. EXAM: DG ABDOMEN ACUTE W/ 1V CHEST COMPARISON:  Chest radiographs dated 04/20/2017. FINDINGS: The cardiac silhouette remains borderline enlarged. No significant change in a small amount of linear scarring at the left lateral lung base. Minimal linear scarring and probable pleural thickening at the right lateral lung base. Otherwise, clear lungs. Normal bowel gas pattern without free peritoneal air. Bilateral pelvic phleboliths. Additional left mid abdominal probable phleboliths. No definite urinary tract calculi visualized. Mild thoracolumbar scoliosis and mild lumbar spine degenerative changes. Mild left glenohumeral degenerative spur formation. Superior migration of both humeral heads abutting the acromion on each side with bony remodeling of the acromion, compatible with large, chronic bilateral rotator cuff tears. IMPRESSION: No acute abnormality. Electronically Signed   By: Beckie Salts M.D.   On: 04/03/2018 15:19   US Abdomen Limited Ruq  Result Date: 04/03/2018 CLINICAL DATA:  Epigastric pain, nausea and vomiting EXAM: ULTRASOUND ABDOMEN LIMITED RIGHT UPPER QUADRANT COMPARISON:  None. FINDINGS: Gallbladder: Multiple shadowing stones measuring up to 2 cm. Positive sonographic Murphy. Wall thickness of 3 mm. Small amount of pericholecystic fluid. Common bile duct: Diameter: Dilated at 8 mm. Liver: No focal lesion identified. Within normal limits in parenchymal echogenicity. Portal vein is patent on color Doppler imaging with normal direction of blood flow towards the liver. Possible small amount of fluid adjacent to the liver IMPRESSION: 1. Multiple gallstones with positive sonographic Murphy and small amount of pericholecystic fluid concerning for an acute cholecystitis. 2. Dilated common bile duct measuring up to 8 mm. If ductal stone or  obstruction is suspected, further evaluation with MRCP could be obtained. Electronically Signed   By: Jasmine Pang M.D.   On: 04/03/2018 16:46     EKG: Independently reviewed.  Sinus rhythm, QTC 449, bifascicular block which is old.  Assessment/Plan Principal Problem:   Acute cholecystitis Active Problems:   HTN (hypertension)   Hypothyroidism   Asthma   GERD (gastroesophageal reflux disease)   Normocytic anemia   Sepsis (HCC)   Gallstone   Elevated troponin   Sepsis due to acute cholecystitis and gallstone: pt meets the criteria for sepsis fever, tachycardia and tachypnea.  Lactic acid is elevated at 2.4.  Hemodynamically stable.  Dr. Geralyn Corwin of general surgeon was consulted--> likely needlap chole.   -will admit to tele bed as inpt -IV Zosyn -PRN morphine for pain, Zofran for nausea -Blood culture -  will get Procalcitonin and trend lactic acid levels per sepsis protocol. -IVF: 2L of NS bolus in ED, followed by 125 cc/h  - INR/PTT/type & screen -will consult to cardiology due to elevated trop  Essential hypertension: -IV Hydralazine prn - will hold home Amlodipine due to risk of developing hypotension secondary to sepsis  Hypothyroidism: -Continue Synthroid  Asthma: Stable -prn Xopenex nebulizer  GERD (gastroesophageal reflux disease): -Protonix  Normocytic anemia: Hemoglobin 10.9 -Follow-up with CBC  Elevated troponin: Troponin 0.03, 0.05, no chest pain or shortness of breath. Liikely due to demand ischemia secondary to sepsis. - cycle CE q6 x3 and repeat EKG in the am  - prn Nitroglycerin, Morphine, and aspirin - Risk factor stratification: will check FLP and A1C  - inpt card consult was requested via Epic   Inpatient status:  # Patient requires inpatient status due to high intensity of service, high risk for further deterioration and high frequency of surveillance required.  I certify that at the point of admission it is my clinical judgment that the  patient will require inpatient hospital care spanning beyond 2 midnights from the point of admission.  . This patient has multiple chronic comorbidities including hypertension, asthma, GERD, hypothyroidism . Now patient has presenting with sepsis due to acute cholecystitis, gallstone and elevated troponin . The worrisome physical exam findings include abdominal tenderness . The initial radiographic and laboratory data are worrisome because of abnormal liver function, positive troponin. US-RUQ showed multiple gallstones and cholecystitis,CBD dilation.. . Current medical needs: please see my assessment and plan . Predictability of an adverse outcome (risk): Patient has multiple comorbidities, now presents with sepsis due to acute cholecystitis.  Patient also has multiple gallstone, and elevated troponin.  Patient will need surgery.  Patient is at high risk for deteriorating.  Patient will need to be treated in hospital for at least 2 days.   DVT ppx: SCD Code Status: Full code Family Communication:  Yes, patient's 4 children at bed side Disposition Plan:  Anticipate discharge back to previous home environment Consults called: Dr. Ezzard Standing of general surgeon Admission status:   Inpatient/tele     Date of Service 04/03/2018    Lorretta Harp [facility]   If 7PM-7AM, please contact night-coverage [email or web address] Password [email or web address] 04/03/2018, 11:03 PM

## 2018-04-03 NOTE — ED Notes (Signed)
Pt has not had anything to eat since a light breakfast this morning.

## 2018-04-03 NOTE — ED Triage Notes (Signed)
Pt reports chills, nausea, "heartburn", and vomiting since 10 am today

## 2018-04-04 ENCOUNTER — Inpatient Hospital Stay (HOSPITAL_COMMUNITY): Payer: Medicare Other | Admitting: Anesthesiology

## 2018-04-04 ENCOUNTER — Inpatient Hospital Stay (HOSPITAL_COMMUNITY): Payer: Medicare Other

## 2018-04-04 ENCOUNTER — Encounter (HOSPITAL_COMMUNITY)
Admission: EM | Disposition: A | Discharge status: Home / Self Care | Payer: Self-pay | Source: Home / Self Care | Attending: Internal Medicine

## 2018-04-04 DIAGNOSIS — I361 Nonrheumatic tricuspid (valve) insufficiency: Secondary | ICD-10-CM

## 2018-04-04 DIAGNOSIS — R9431 Abnormal electrocardiogram [ECG] [EKG]: Secondary | ICD-10-CM

## 2018-04-04 DIAGNOSIS — I340 Nonrheumatic mitral (valve) insufficiency: Secondary | ICD-10-CM

## 2018-04-04 HISTORY — PX: CHOLECYSTECTOMY: SHX55

## 2018-04-04 LAB — TROPONIN I
Troponin I: 0.04 ng/mL (ref <0.03)
Troponin I: 0.04 ng/mL (ref <0.03)

## 2018-04-04 LAB — BLOOD CULTURE ID PANEL (REFLEXED)
Acinetobacter baumannii: NOT DETECTED
Candida albicans: NOT DETECTED
Candida glabrata: NOT DETECTED
Candida krusei: NOT DETECTED
Candida parapsilosis: NOT DETECTED
Candida tropicalis: NOT DETECTED
Carbapenem resistance: NOT DETECTED
Enterobacter cloacae complex: NOT DETECTED
Enterobacteriaceae species: DETECTED — AB
Enterococcus species: NOT DETECTED
Escherichia coli: DETECTED — AB
HAEMOPHILUS INFLUENZAE: NOT DETECTED
Klebsiella oxytoca: NOT DETECTED
Klebsiella pneumoniae: NOT DETECTED
Listeria monocytogenes: NOT DETECTED
Neisseria meningitidis: NOT DETECTED
Proteus species: NOT DETECTED
Pseudomonas aeruginosa: NOT DETECTED
SERRATIA MARCESCENS: NOT DETECTED
STREPTOCOCCUS SPECIES: NOT DETECTED
Staphylococcus aureus (BCID): NOT DETECTED
Staphylococcus species: NOT DETECTED
Streptococcus agalactiae: NOT DETECTED
Streptococcus pneumoniae: NOT DETECTED
Streptococcus pyogenes: NOT DETECTED

## 2018-04-04 LAB — CBC
HEMATOCRIT: 29.1 % — AB (ref 36.0–46.0)
Hemoglobin: 8.8 g/dL — ABNORMAL LOW (ref 12.0–15.0)
MCH: 25.3 pg — ABNORMAL LOW (ref 26.0–34.0)
MCHC: 30.2 g/dL (ref 30.0–36.0)
MCV: 83.6 fL (ref 80.0–100.0)
Platelets: 234 10*3/uL (ref 150–400)
RBC: 3.48 MIL/uL — ABNORMAL LOW (ref 3.87–5.11)
RDW: 16.4 % — ABNORMAL HIGH (ref 11.5–15.5)
WBC: 27.8 10*3/uL — ABNORMAL HIGH (ref 4.0–10.5)
nRBC: 0 % (ref 0.0–0.2)

## 2018-04-04 LAB — ABO/RH: ABO/RH(D): AB POS

## 2018-04-04 LAB — ECHOCARDIOGRAM COMPLETE
Height: 65 in
Weight: 2705.49 oz

## 2018-04-04 LAB — SURGICAL PCR SCREEN
MRSA, PCR: NEGATIVE
Staphylococcus aureus: NEGATIVE

## 2018-04-04 LAB — LIPID PANEL
CHOLESTEROL: 98 mg/dL (ref 0–200)
HDL: 40 mg/dL — ABNORMAL LOW (ref >40)
LDL Cholesterol: 53 mg/dL (ref 0–99)
Total CHOL/HDL Ratio: 2.5 RATIO
Triglycerides: 27 mg/dL (ref <150)
VLDL: 5 mg/dL (ref 0–40)

## 2018-04-04 LAB — COMPREHENSIVE METABOLIC PANEL
ALT: 194 U/L — ABNORMAL HIGH (ref 0–44)
AST: 358 U/L — AB (ref 15–41)
Albumin: 2.8 g/dL — ABNORMAL LOW (ref 3.5–5.0)
Alkaline Phosphatase: 201 U/L — ABNORMAL HIGH (ref 38–126)
Anion gap: 5 (ref 5–15)
BUN: 18 mg/dL (ref 8–23)
CO2: 21 mmol/L — ABNORMAL LOW (ref 22–32)
CREATININE: 1.1 mg/dL — AB (ref 0.44–1.00)
Calcium: 7.7 mg/dL — ABNORMAL LOW (ref 8.9–10.3)
Chloride: 113 mmol/L — ABNORMAL HIGH (ref 98–111)
GFR calc Af Amer: 55 mL/min — ABNORMAL LOW (ref >60)
GFR calc non Af Amer: 48 mL/min — ABNORMAL LOW (ref >60)
Glucose, Bld: 99 mg/dL (ref 70–99)
Potassium: 3.4 mmol/L — ABNORMAL LOW (ref 3.5–5.1)
Sodium: 139 mmol/L (ref 135–145)
Total Bilirubin: 2.9 mg/dL — ABNORMAL HIGH (ref 0.3–1.2)
Total Protein: 5.7 g/dL — ABNORMAL LOW (ref 6.5–8.1)

## 2018-04-04 LAB — LACTIC ACID, PLASMA: Lactic Acid, Venous: 2 mmol/L (ref 0.5–1.9)

## 2018-04-04 LAB — HEMOGLOBIN A1C
Hgb A1c MFr Bld: 5.8 % — ABNORMAL HIGH (ref 4.8–5.6)
Mean Plasma Glucose: 119.76 mg/dL

## 2018-04-04 SURGERY — LAPAROSCOPIC CHOLECYSTECTOMY WITH INTRAOPERATIVE CHOLANGIOGRAM
Anesthesia: General | Site: Abdomen

## 2018-04-04 MED ORDER — MEPERIDINE HCL 50 MG/ML IJ SOLN: 6.2500 mg | INTRAMUSCULAR | Status: DC | PRN

## 2018-04-04 MED ORDER — ESMOLOL HCL 100 MG/10ML IV SOLN
INTRAVENOUS | Status: AC
  Filled 2018-04-04: qty 10

## 2018-04-04 MED ORDER — ONDANSETRON HCL 4 MG/2ML IJ SOLN
INTRAMUSCULAR | Status: DC | PRN
  Administered 2018-04-04: 4 mg via INTRAVENOUS

## 2018-04-04 MED ORDER — SODIUM CHLORIDE 0.9 % IV SOLN
INTRAVENOUS | Status: DC | PRN
  Administered 2018-04-04: 12:00:00

## 2018-04-04 MED ORDER — LACTATED RINGERS IV SOLN
INTRAVENOUS | Status: DC | PRN
  Administered 2018-04-04: 11:00:00 via INTRAVENOUS

## 2018-04-04 MED ORDER — LIDOCAINE 2% (20 MG/ML) 5 ML SYRINGE
INTRAMUSCULAR | Status: DC | PRN
  Administered 2018-04-04: 75 mg via INTRAVENOUS

## 2018-04-04 MED ORDER — 0.9 % SODIUM CHLORIDE (POUR BTL) OPTIME
TOPICAL | Status: DC | PRN
  Administered 2018-04-04: 1000 mL

## 2018-04-04 MED ORDER — PHENYLEPHRINE 40 MCG/ML (10ML) SYRINGE FOR IV PUSH (FOR BLOOD PRESSURE SUPPORT)
PREFILLED_SYRINGE | INTRAVENOUS | Status: AC
  Filled 2018-04-04: qty 10

## 2018-04-04 MED ORDER — BUPIVACAINE-EPINEPHRINE 0.25% -1:200000 IJ SOLN
INTRAMUSCULAR | Status: DC | PRN
  Administered 2018-04-04: 30 mL

## 2018-04-04 MED ORDER — FENTANYL CITRATE (PF) 250 MCG/5ML IJ SOLN
INTRAMUSCULAR | Status: AC
  Filled 2018-04-04: qty 5

## 2018-04-04 MED ORDER — IOPAMIDOL (ISOVUE-300) INJECTION 61%
INTRAVENOUS | Status: AC
  Filled 2018-04-04: qty 50

## 2018-04-04 MED ORDER — PROMETHAZINE HCL 25 MG/ML IJ SOLN: 6.2500 mg | INTRAMUSCULAR | Status: DC | PRN

## 2018-04-04 MED ORDER — ROCURONIUM BROMIDE 10 MG/ML (PF) SYRINGE
PREFILLED_SYRINGE | INTRAVENOUS | Status: DC | PRN
  Administered 2018-04-04: 50 mg via INTRAVENOUS

## 2018-04-04 MED ORDER — PROPOFOL 10 MG/ML IV BOLUS
INTRAVENOUS | Status: DC | PRN
  Administered 2018-04-04: 110 mg via INTRAVENOUS

## 2018-04-04 MED ORDER — ROCURONIUM BROMIDE 100 MG/10ML IV SOLN
INTRAVENOUS | Status: AC
  Filled 2018-04-04: qty 1

## 2018-04-04 MED ORDER — ESMOLOL HCL 100 MG/10ML IV SOLN
INTRAVENOUS | Status: DC | PRN
  Administered 2018-04-04: 30 mg via INTRAVENOUS

## 2018-04-04 MED ORDER — HYDROMORPHONE HCL 1 MG/ML IJ SOLN: 0.2500 mg | INTRAMUSCULAR | Status: DC | PRN

## 2018-04-04 MED ORDER — TRAMADOL HCL 50 MG PO TABS
100.0000 mg | ORAL_TABLET | Freq: Two times a day (BID) | ORAL | Status: DC | PRN
  Administered 2018-04-04 – 2018-04-06 (×3): 100 mg via ORAL
  Filled 2018-04-04 (×3): qty 2

## 2018-04-04 MED ORDER — LIDOCAINE 2% (20 MG/ML) 5 ML SYRINGE
INTRAMUSCULAR | Status: AC
  Filled 2018-04-04: qty 5

## 2018-04-04 MED ORDER — FENTANYL CITRATE (PF) 250 MCG/5ML IJ SOLN
INTRAMUSCULAR | Status: DC | PRN
  Administered 2018-04-04 (×2): 50 ug via INTRAVENOUS
  Administered 2018-04-04: 100 ug via INTRAVENOUS

## 2018-04-04 MED ORDER — DEXAMETHASONE SODIUM PHOSPHATE 10 MG/ML IJ SOLN
INTRAMUSCULAR | Status: DC | PRN
  Administered 2018-04-04: 10 mg via INTRAVENOUS

## 2018-04-04 MED ORDER — SUGAMMADEX SODIUM 200 MG/2ML IV SOLN
INTRAVENOUS | Status: AC
  Filled 2018-04-04: qty 2

## 2018-04-04 MED ORDER — BUPIVACAINE HCL (PF) 0.25 % IJ SOLN
INTRAMUSCULAR | Status: AC
  Filled 2018-04-04: qty 30

## 2018-04-04 MED ORDER — ONDANSETRON HCL 4 MG/2ML IJ SOLN
INTRAMUSCULAR | Status: AC
  Filled 2018-04-04: qty 2

## 2018-04-04 MED ORDER — PHENYLEPHRINE 40 MCG/ML (10ML) SYRINGE FOR IV PUSH (FOR BLOOD PRESSURE SUPPORT)
PREFILLED_SYRINGE | INTRAVENOUS | Status: DC | PRN
  Administered 2018-04-04 (×2): 80 ug via INTRAVENOUS

## 2018-04-04 MED ORDER — PROPOFOL 10 MG/ML IV BOLUS
INTRAVENOUS | Status: AC
  Filled 2018-04-04: qty 20

## 2018-04-04 MED ORDER — SUGAMMADEX SODIUM 200 MG/2ML IV SOLN
INTRAVENOUS | Status: DC | PRN
  Administered 2018-04-04: 200 mg via INTRAVENOUS

## 2018-04-04 MED ORDER — KCL IN DEXTROSE-NACL 20-5-0.45 MEQ/L-%-% IV SOLN
INTRAVENOUS | Status: DC
  Administered 2018-04-04: 14:00:00 via INTRAVENOUS
  Filled 2018-04-04 (×3): qty 1000

## 2018-04-04 MED ORDER — LACTATED RINGERS IV SOLN
INTRAVENOUS | Status: AC | PRN
  Administered 2018-04-04: 1000 mL

## 2018-04-04 MED ORDER — DEXAMETHASONE SODIUM PHOSPHATE 10 MG/ML IJ SOLN
INTRAMUSCULAR | Status: AC
  Filled 2018-04-04: qty 1

## 2018-04-04 SURGICAL SUPPLY — 39 items
APPLIER CLIP 5 13 M/L LIGAMAX5 (MISCELLANEOUS)
APPLIER CLIP ROT 10 11.4 M/L (STAPLE)
CABLE HIGH FREQUENCY MONO STRZ (ELECTRODE) ×3 IMPLANT
CHLORAPREP W/TINT 26ML (MISCELLANEOUS) ×3 IMPLANT
CHOLANGIOGRAM CATH TAUT (CATHETERS) ×3 IMPLANT
CLIP APPLIE 5 13 M/L LIGAMAX5 (MISCELLANEOUS) IMPLANT
CLIP APPLIE ROT 10 11.4 M/L (STAPLE) IMPLANT
CLOSURE WOUND 1/4X4 (GAUZE/BANDAGES/DRESSINGS)
COVER MAYO STAND STRL (DRAPES) ×3 IMPLANT
COVER SURGICAL LIGHT HANDLE (MISCELLANEOUS) ×3 IMPLANT
COVER WAND RF STERILE (DRAPES) IMPLANT
DECANTER SPIKE VIAL GLASS SM (MISCELLANEOUS) ×3 IMPLANT
DERMABOND ADVANCED (GAUZE/BANDAGES/DRESSINGS) ×2
DERMABOND ADVANCED .7 DNX12 (GAUZE/BANDAGES/DRESSINGS) ×1 IMPLANT
DRAPE C-ARM 42X120 X-RAY (DRAPES) ×3 IMPLANT
ELECT REM PT RETURN 15FT ADLT (MISCELLANEOUS) ×3 IMPLANT
GLOVE SURG SIGNA 7.5 PF LTX (GLOVE) ×3 IMPLANT
GOWN STRL REUS W/TWL XL LVL3 (GOWN DISPOSABLE) ×9 IMPLANT
HEMOSTAT SURGICEL 4X8 (HEMOSTASIS) IMPLANT
IV CATH 14GX2 1/4 (CATHETERS) ×3 IMPLANT
IV SET EXTENSION CATH 6 NF (IV SETS) ×3 IMPLANT
KIT BASIN OR (CUSTOM PROCEDURE TRAY) ×3 IMPLANT
KIT TURNOVER KIT A (KITS) IMPLANT
POUCH RETRIEVAL ECOSAC 10 (ENDOMECHANICALS) ×1 IMPLANT
POUCH RETRIEVAL ECOSAC 10MM (ENDOMECHANICALS) ×2
SCISSORS LAP 5X35 DISP (ENDOMECHANICALS) ×3 IMPLANT
SET IRRIG TUBING LAPAROSCOPIC (IRRIGATION / IRRIGATOR) ×3 IMPLANT
SET TUBE SMOKE EVAC HIGH FLOW (TUBING) ×3 IMPLANT
SLEEVE ADV FIXATION 5X100MM (TROCAR) ×3 IMPLANT
STOPCOCK 4 WAY LG BORE MALE ST (IV SETS) ×3 IMPLANT
STRIP CLOSURE SKIN 1/4X4 (GAUZE/BANDAGES/DRESSINGS) IMPLANT
SUT MNCRL AB 4-0 PS2 18 (SUTURE) ×3 IMPLANT
SYR 10ML ECCENTRIC (SYRINGE) ×3 IMPLANT
TOWEL OR 17X26 10 PK STRL BLUE (TOWEL DISPOSABLE) ×3 IMPLANT
TOWEL OR NON WOVEN STRL DISP B (DISPOSABLE) ×3 IMPLANT
TRAY LAPAROSCOPIC (CUSTOM PROCEDURE TRAY) ×3 IMPLANT
TROCAR ADV FIXATION 11X100MM (TROCAR) IMPLANT
TROCAR ADV FIXATION 5X100MM (TROCAR) ×3 IMPLANT
TROCAR XCEL BLUNT TIP 100MML (ENDOMECHANICALS) ×3 IMPLANT

## 2018-04-04 NOTE — Progress Notes (Signed)
Echo personally reviewed- LVEF 60-65%, no regional WMA's, moderate (known) pulmonary hypertension, atrial septal aneuysm with small PFO.  Acceptable risk to proceed to surgery. No further cardiac work-up of elevated troponin recommended as this time. Call with questions.  [facility] will sign off.   Medication Recommendations:  none Other recommendations (labs, testing, etc):  none Follow up as an outpatient: PRN

## 2018-04-04 NOTE — Consult Note (Signed)
CONSULTATION NOTE   Patient Name: Dominique Ochoa Date of Encounter: 04/04/2018 Cardiologist: No primary care provider on file.  Chief Complaint   Elevated troponin  Patient Profile   78 yo female with history of HTN, asthma, GERD and thyroid disease, presents with fever, chills, nausea, vomiting and abdominal pain and was found to have acute cholecystitis. Troponins are mildly elevated and cardiology is asked to evaluate for preoperative risk assessment.  HPI   Dominique Ochoa is a 78 y.o. female who is being seen today for the evaluation of elevated troponins at the request of Dr. Elvera Lennox. This is a pleasant 78 yo female with history of HTN, asthma, GERD and thyroid disease, presents with fever, chills, nausea, vomiting and abdominal pain and was found to have acute cholecystitis. Troponins are mildly elevated and cardiology is asked to evaluate for preoperative risk assessment. She had recent onset symptoms, but denies any cough, shortness of breath or chest pain. In the ER she was febrile, tachycardic and tachypneic. EKG personally reviewed and is abnormal, with bifascicular block. Lipid profile is very favorable with low LDL of 53 and HDL of 40. No significant family history of CAD. No hypertension or diabetes. CXR did show borderline enlarged cardiac silhouette. Additionally, she was noted to have intracranial atherosclerosis in 03/2017 by head CT. CT angio of the chest in 2018 showed aortic atherosclerosis, but no mention of coronary atherosclerosis. A stress test in 2017 showed normal LVEF 79% without ischemia. Echo at that time showed LVEF 55-60%, grade 1 DD, pardoxical septal motion, mild MR, mild RVE, moderate RAE and moderate pulmonary hypertension.  PMHx   Past Medical History:  Diagnosis Date  . Asthma   . GERD (gastroesophageal reflux disease)   . Hypertension   . Thyroid disease     History reviewed. No pertinent surgical history.  FAMHx   Family History  Problem Relation  Age of Onset  . Hypertension Son     SOCHx    reports that she has never smoked. She has never used smokeless tobacco. She reports that she does not drink alcohol or use drugs.  Outpatient Medications   No current facility-administered medications on file prior to encounter.    Current Outpatient Medications on File Prior to Encounter  Medication Sig Dispense Refill  . albuterol (PROVENTIL HFA;VENTOLIN HFA) 108 (90 Base) MCG/ACT inhaler Inhale 1 puff into the lungs every 6 (six) hours as needed for wheezing or shortness of breath.    Marland Kitchen albuterol (PROVENTIL) (5 MG/ML) 0.5% nebulizer solution Take 0.5 mLs (2.5 mg total) by nebulization every 6 (six) hours as needed for wheezing or shortness of breath. 20 mL 12  . amLODipine (NORVASC) 5 MG tablet Take 5 mg by mouth daily.    . cetirizine (ZYRTEC) 10 MG tablet Take 10 mg by mouth daily.    Marland Kitchen levothyroxine (SYNTHROID, LEVOTHROID) 25 MCG tablet Take 25 mcg by mouth daily before breakfast.    . Multiple Vitamin (MULTIVITAMIN WITH MINERALS) TABS tablet Take 1 tablet by mouth daily.    . Vitamin D, Ergocalciferol, (DRISDOL) 50000 units CAPS capsule Take 50,000 Units by mouth every 7 (seven) days. Tuesday of each week      Inpatient Medications    Scheduled Meds: . aspirin EC  81 mg Oral Daily  . levothyroxine  25 mcg Oral QAC breakfast  . loratadine  10 mg Oral Daily  . multivitamin with minerals  1 tablet Oral Daily    Continuous Infusions: . sodium chloride 125 mL/hr  at 04/03/18 2305  . piperacillin-tazobactam (ZOSYN)  IV 12.5 mL/hr at 04/04/18 0600    PRN Meds: hydrALAZINE, ibuprofen, levalbuterol, morphine injection, nitroGLYCERIN, ondansetron **OR** ondansetron (ZOFRAN) IV, pantoprazole, senna-docusate   ALLERGIES   Allergies  Allergen Reactions  . Fish Allergy Other (See Comments)    Makes her weak  . Fish Oil Other (See Comments) and Swelling    MD orders  . Lisinopril Swelling    Facial swelling  .  Sulfamethoxazole-Trimethoprim Itching and Swelling    Facial swelling    ROS   Pertinent items noted in HPI and remainder of comprehensive ROS otherwise negative.  Vitals   Vitals:   04/03/18 1830 04/03/18 1946 04/03/18 2256 04/04/18 0525  BP: 121/66 (!) 129/56 (!) 113/54 127/64  Pulse: 96 (!) 102 93 95  Resp: (!) 26 15 (!) 25 (!) 22  Temp:  99.4 F (37.4 C) 99.7 F (37.6 C) 99.2 F (37.3 C)  TempSrc:  Oral Oral Oral  SpO2: 95% 97% 98% 92%  Weight:  76.7 kg    Height:   (1.651 m)      Intake/Output Summary (Last 24 hours) at 04/04/2018 0756 Last data filed at 04/04/2018 0600 Gross per 24 hour  Intake 1478.9 ml  Output -  Net 1478.9 ml   Filed Weights   04/03/18 1401 04/03/18 1946  Weight: 72.1 kg 76.7 kg    Physical Exam   General appearance: alert and no distress Neck: no carotid bruit, no JVD and thyroid not enlarged, symmetric, no tenderness/mass/nodules Lungs: clear to auscultation bilaterally Heart: regular rate and rhythm, S1, S2 normal, no murmur, click, rub or gallop Abdomen: mild TTP in RUQ, mild voluntary guarding, +BS Extremities: extremities normal, atraumatic, no cyanosis or edema Pulses: 2+ and symmetric Skin: Skin color, texture, turgor normal. No rashes or lesions Neurologic: Grossly normal Psych: Pleasant  Labs   Results for orders placed or performed during the hospital encounter of 04/03/18 (from the past 48 hour(s))  Troponin I - ONCE - STAT     Status: Abnormal   Collection Time: 04/03/18  2:29 PM  Result Value Ref Range   Troponin I 0.03 (HH) <0.03 ng/mL    Comment: CRITICAL RESULT CALLED TO, READ BACK BY AND VERIFIED WITH: AMY HARTLET RN  04/03/2018 OLSONM Performed at [facility], [address] [place], [place] [address]   CBC with Differential     Status: Abnormal   Collection Time: 04/03/18  2:29 PM  Result Value Ref Range   WBC 9.9 4.0 - 10.5 K/uL    Comment: REPEATED TO VERIFY   RBC 4.45 3.87 - 5.11  MIL/uL   Hemoglobin 10.9 (L) 12.0 - 15.0 g/dL   HCT 56.3 87.5 - 64.3 %   MCV 81.6 80.0 - 100.0 fL   MCH 24.5 (L) 26.0 - 34.0 pg   MCHC 30.0 30.0 - 36.0 g/dL   RDW 32.9 (H) 51.8 - 84.1 %   Platelets 294 150 - 400 K/uL   nRBC 0.0 0.0 - 0.2 %   Neutrophils Relative % 89 %   Neutro Abs 8.8 (H) 1.7 - 7.7 K/uL   Lymphocytes Relative 8 %   Lymphs Abs 0.7 0.7 - 4.0 K/uL   Monocytes Relative 1 %   Monocytes Absolute 0.1 0.1 - 1.0 K/uL   Eosinophils Relative 2 %   Eosinophils Absolute 0.2 0.0 - 0.5 K/uL   Basophils Relative 0 %   Basophils Absolute 0.0 0.0 - 0.1 K/uL  WBC Morphology TOXIC GRANULATION    Immature Granulocytes 0 %   Abs Immature Granulocytes 0.03 0.00 - 0.07 K/uL   Ovalocytes PRESENT    Giant PLTs PRESENT     Comment: Performed at [facility], [address] [place], [place] [address]  Comprehensive metabolic panel     Status: Abnormal   Collection Time: 04/03/18  2:29 PM  Result Value Ref Range   Sodium 137 135 - 145 mmol/L   Potassium 3.6 3.5 - 5.1 mmol/L   Chloride 106 98 - 111 mmol/L   CO2 22 22 - 32 mmol/L   Glucose, Bld 150 (H) 70 - 99 mg/dL   BUN 18 8 - 23 mg/dL   Creatinine, Ser 1.44 0.44 - 1.00 mg/dL   Calcium 8.8 (L) 8.9 - 10.3 mg/dL   Total Protein 7.1 6.5 - 8.1 g/dL   Albumin 3.6 3.5 - 5.0 g/dL   AST 818 (H) 15 - 41 U/L   ALT 149 (H) 0 - 44 U/L   Alkaline Phosphatase 288 (H) 38 - 126 U/L   Total Bilirubin 1.2 0.3 - 1.2 mg/dL   GFR calc non Af Amer 54 (L) >60 mL/min   GFR calc Af Amer >60 >60 mL/min   Anion gap 9 5 - 15    Comment: Performed at [facility], [address] [place], [place] [address]  Lipase, blood     Status: Abnormal   Collection Time: 04/03/18  2:29 PM  Result Value Ref Range   Lipase 57 (H) 11 - 51 U/L    Comment: Performed at [facility], [address] [place], [place] [address]  Urinalysis, Routine w reflex microscopic     Status: Abnormal   Collection Time: 04/03/18  3:13 PM  Result  Value Ref Range   Color, Urine YELLOW YELLOW   APPearance HAZY (A) CLEAR   Specific Gravity, Urine 1.015 1.005 - 1.030   pH 6.0 5.0 - 8.0   Glucose, UA NEGATIVE NEGATIVE mg/dL   Hgb urine dipstick NEGATIVE NEGATIVE   Bilirubin Urine NEGATIVE NEGATIVE   Ketones, ur NEGATIVE NEGATIVE mg/dL   Protein, ur NEGATIVE NEGATIVE mg/dL   Nitrite POSITIVE (A) NEGATIVE   Leukocytes,Ua NEGATIVE NEGATIVE    Comment: Performed at [facility], [address] [place], [place] [address]  Urinalysis, Microscopic (reflex)     Status: Abnormal   Collection Time: 04/03/18  3:13 PM  Result Value Ref Range   RBC / HPF 0-5 0 - 5 RBC/hpf   WBC, UA 0-5 0 - 5 WBC/hpf   Bacteria, UA MANY (A) NONE SEEN   Squamous Epithelial / LPF 6-10 0 - 5    Comment: Performed at [facility], [address] [place], [place] [address]  Troponin I - Now Then Q6H     Status: Abnormal   Collection Time: 04/03/18  9:05 PM  Result Value Ref Range   Troponin I 0.05 (HH) <0.03 ng/mL    Comment: CRITICAL RESULT CALLED TO, READ BACK BY AND VERIFIED WITH: SMITH,E RN @2222  ON 04/03/2018 JACKSON,K Performed at [facility], [address], [place], [place] [address]   Protime-INR     Status: Abnormal   Collection Time: 04/03/18  9:05 PM  Result Value Ref Range   Prothrombin Time 15.5 (H) 11.4 - 15.2 seconds   INR 1.2 0.8 - 1.2    Comment: (NOTE) INR goal varies based  on device and disease states. Performed at [facility], [address] [place], [place] [address]   APTT     Status: None   Collection Time: 04/03/18  9:05 PM  Result Value Ref Range   aPTT 32 24 - 36 seconds    Comment: Performed at [facility], [address], [place], [place] [address]  Type and screen [facility]     Status: None   Collection Time: 04/03/18  9:05 PM  Result Value Ref Range   ABO/RH(D) AB POS    Antibody Screen NEG    Sample Expiration       04/06/2018 Performed at [facility], [address] [place], [place] [address]   Lactic acid, plasma     Status: Abnormal   Collection Time: 04/03/18  9:05 PM  Result Value Ref Range   Lactic Acid, Venous 2.4 (HH) 0.5 - 1.9 mmol/L    Comment: CRITICAL RESULT CALLED TO, READ BACK BY AND VERIFIED WITH: SMITH,E RN  ON 04/03/2018 JACKSON,K Performed at [facility], [address], [place], [place] [address]   Procalcitonin     Status: None   Collection Time: 04/03/18  9:05 PM  Result Value Ref Range   Procalcitonin 60.34 ng/mL    Comment:        Interpretation: PCT >= 10 ng/mL: Important systemic inflammatory response, almost exclusively due to severe bacterial sepsis or septic shock. (NOTE)       Sepsis PCT Algorithm           Lower Respiratory Tract                                      Infection PCT Algorithm    ----------------------------     ----------------------------         PCT < 0.25 ng/mL                PCT < 0.10 ng/mL         Strongly encourage             Strongly discourage   discontinuation of antibiotics    initiation of antibiotics    ----------------------------     -----------------------------       PCT 0.25 - 0.50 ng/mL            PCT 0.10 - 0.25 ng/mL               OR       >80% decrease in PCT            Discourage initiation of                                            antibiotics      Encourage discontinuation           of antibiotics    ----------------------------     -----------------------------         PCT >= 0.50 ng/mL              PCT 0.26 - 0.50 ng/mL                AND       <80% decrease in PCT  Encourage initiation of                                             antibiotics       Encourage continuation           of antibiotics    ----------------------------     -----------------------------        PCT >= 0.50 ng/mL                  PCT > 0.50 ng/mL               AND         increase  in PCT                  Strongly encourage                                      initiation of antibiotics    Strongly encourage escalation           of antibiotics                                     -----------------------------                                           PCT <= 0.25 ng/mL                                                 OR                                        > 80% decrease in PCT                                     Discontinue / Do not initiate                                             antibiotics Performed at [facility], [address], [place], [place] [address]   ABO/Rh     Status: None   Collection Time: 04/03/18 11:01 PM  Result Value Ref Range   ABO/RH(D)      AB POS Performed at [facility], [address], [place], [place] [address]   Lactic acid, plasma     Status: Abnormal   Collection Time: 04/04/18 12:11 AM  Result Value Ref Range   Lactic Acid, Venous 2.0 (HH) 0.5 - 1.9 mmol/L    Comment: CRITICAL RESULT CALLED TO, READ BACK BY AND VERIFIED WITH: CAMPBELL,M RN @0034  ON 04/04/2018 JACKSON,K Performed at [facility], [address], [place], [place] [address]  Hemoglobin A1c     Status: Abnormal   Collection Time: 04/04/18  2:22 AM  Result Value Ref Range   Hgb A1c MFr Bld 5.8 (H) 4.8 - 5.6 %    Comment: (NOTE) Pre diabetes:          5.7%-6.4% Diabetes:              >6.4% Glycemic control for   <7.0% adults with diabetes    Mean Plasma Glucose 119.76 mg/dL    Comment: Performed at [facility], [address], [place], [place] [address]  Lipid panel     Status: Abnormal   Collection Time: 04/04/18  2:22 AM  Result Value Ref Range   Cholesterol 98 0 - 200 mg/dL   Triglycerides 27 <604 mg/dL   HDL 40 (L) >54 mg/dL   Total CHOL/HDL Ratio 2.5 RATIO   VLDL 5 0 - 40 mg/dL   LDL Cholesterol 53 0 - 99 mg/dL    Comment:        Total Cholesterol/HDL:CHD Risk Coronary Heart  Disease Risk Table                     Men   Women  1/2 Average Risk   3.4   3.3  Average Risk       5.0   4.4  2 X Average Risk   9.6   7.1  3 X Average Risk  23.4   11.0        Use the calculated Patient Ratio above and the CHD Risk Table to determine the patient's CHD Risk.        ATP III CLASSIFICATION (LDL):  <100     mg/dL   Optimal  098-119  mg/dL   Near or Above                    Optimal  130-159  mg/dL   Borderline  147-829  mg/dL   High  >562     mg/dL   Very High Performed at [facility], [address], [place], [place] [address]   Troponin I - Now Then Q6H     Status: Abnormal   Collection Time: 04/04/18  2:22 AM  Result Value Ref Range   Troponin I 0.04 (HH) <0.03 ng/mL    Comment: CRITICAL VALUE NOTED.  VALUE IS CONSISTENT WITH PREVIOUSLY REPORTED AND CALLED VALUE. Performed at [facility], [address], [place], [place] [address]   Troponin I - Now Then Q6H     Status: Abnormal   Collection Time: 04/04/18  2:22 AM  Result Value Ref Range   Troponin I 0.04 (HH) <0.03 ng/mL    Comment: CRITICAL VALUE NOTED.  VALUE IS CONSISTENT WITH PREVIOUSLY REPORTED AND CALLED VALUE. Performed at [facility], [address] [place], [place] [address]   CBC     Status: Abnormal   Collection Time: 04/04/18  2:22 AM  Result Value Ref Range   WBC 27.8 (H) 4.0 - 10.5 K/uL   RBC 3.48 (L) 3.87 - 5.11 MIL/uL   Hemoglobin 8.8 (L) 12.0 - 15.0 g/dL   HCT 28.4 (L) 13.2 - 44.0 %   MCV 83.6 80.0 - 100.0 fL   MCH 25.3 (L) 26.0 - 34.0 pg   MCHC 30.2 30.0 - 36.0 g/dL   RDW 10.2 (H) 72.5 - 36.6 %   Platelets 234 150 - 400 K/uL   nRBC 0.0  0.0 - 0.2 %    Comment: Performed at [facility], [address] [place], [place] [address]  Comprehensive metabolic panel     Status: Abnormal   Collection Time: 04/04/18  2:22 AM  Result Value Ref Range   Sodium 139 135 - 145 mmol/L   Potassium 3.4 (L) 3.5 - 5.1  mmol/L   Chloride 113 (H) 98 - 111 mmol/L   CO2 21 (L) 22 - 32 mmol/L   Glucose, Bld 99 70 - 99 mg/dL   BUN 18 8 - 23 mg/dL   Creatinine, Ser 2.13 (H) 0.44 - 1.00 mg/dL   Calcium 7.7 (L) 8.9 - 10.3 mg/dL   Total Protein 5.7 (L) 6.5 - 8.1 g/dL   Albumin 2.8 (L) 3.5 - 5.0 g/dL   AST 086 (H) 15 - 41 U/L   ALT 194 (H) 0 - 44 U/L   Alkaline Phosphatase 201 (H) 38 - 126 U/L   Total Bilirubin 2.9 (H) 0.3 - 1.2 mg/dL   GFR calc non Af Amer 48 (L) >60 mL/min   GFR calc Af Amer 55 (L) >60 mL/min   Anion gap 5 5 - 15    Comment: Performed at [facility], [address] [place], [place] [address]    ECG   NSR at 88 with bifascicular block - Personally Reviewed  Telemetry   Sinus rhythm - Personally Reviewed  Radiology   Dg Abdomen Acute W/chest  Result Date: 04/03/2018 CLINICAL DATA:  Nausea and vomiting and chills since 10 a.m. today. EXAM: DG ABDOMEN ACUTE W/ 1V CHEST COMPARISON:  Chest radiographs dated 04/20/2017. FINDINGS: The cardiac silhouette remains borderline enlarged. No significant change in a small amount of linear scarring at the left lateral lung base. Minimal linear scarring and probable pleural thickening at the right lateral lung base. Otherwise, clear lungs. Normal bowel gas pattern without free peritoneal air. Bilateral pelvic phleboliths. Additional left mid abdominal probable phleboliths. No definite urinary tract calculi visualized. Mild thoracolumbar scoliosis and mild lumbar spine degenerative changes. Mild left glenohumeral degenerative spur formation. Superior migration of both humeral heads abutting the acromion on each side with bony remodeling of the acromion, compatible with large, chronic bilateral rotator cuff tears. IMPRESSION: No acute abnormality. Electronically Signed   By: Beckie Salts M.D.   On: 04/03/2018 15:19   US Abdomen Limited Ruq  Result Date: 04/03/2018 CLINICAL DATA:  Epigastric pain, nausea and vomiting EXAM: ULTRASOUND  ABDOMEN LIMITED RIGHT UPPER QUADRANT COMPARISON:  None. FINDINGS: Gallbladder: Multiple shadowing stones measuring up to 2 cm. Positive sonographic Murphy. Wall thickness of 3 mm. Small amount of pericholecystic fluid. Common bile duct: Diameter: Dilated at 8 mm. Liver: No focal lesion identified. Within normal limits in parenchymal echogenicity. Portal vein is patent on color Doppler imaging with normal direction of blood flow towards the liver. Possible small amount of fluid adjacent to the liver IMPRESSION: 1. Multiple gallstones with positive sonographic Murphy and small amount of pericholecystic fluid concerning for an acute cholecystitis. 2. Dilated common bile duct measuring up to 8 mm. If ductal stone or obstruction is suspected, further evaluation with MRCP could be obtained. Electronically Signed   By: Jasmine Pang M.D.   On: 04/03/2018 16:46    Cardiac Studies   Echo pending  Impression   1. Principal Problem: 2.   Acute cholecystitis 3. Active Problems: 4.   HTN (hypertension) 5.   Hypothyroidism 6.   Asthma 7.   GERD (gastroesophageal reflux disease) 8.  Normocytic anemia 9.   Sepsis (HCC) 10.   Gallstone 11.   Elevated troponin 12.   Recommendation   1. This is a 78 yo female with acute cholecystitis, fever and very mildly elevated troponin, without anginal symptoms. I doubt this is ACS. She has been asymptomatic and can do more than 4 METS of activity prior to her illness. She had an echo and stress test in 2017 which were reassuring, other than some right heart enlargement and probable moderate pulmonary hypertension (which may explain the bifascicular block). I would recommend a repeat echo to look for interval changes which may explain her troponin elevation which we will try to obtain this morning. If largely unchanged, would be at acceptable risk for surgery.  Thanks for the consultation.  Time Spent Directly with Patient:  I have spent a total of 45 minutes with  the patient reviewing hospital notes, telemetry, EKGs, labs and examining the patient as well as establishing an assessment and plan that was discussed personally with the patient.  > 50% of time was spent in direct patient care.  Length of Stay:  LOS: 1 day   Chrystie Nose C. , MD, [facility] For [facility] IncFACC, FACP  [place]  [facility]  Medical Director of the [facility]  [facility] Diplomate of the American Board of Clinical Lipidology Attending Cardiologist  Direct Dial: [phone]  Fax: [phone]  Website:  [email or web address]    C  04/04/2018, 7:56 AM

## 2018-04-04 NOTE — Progress Notes (Signed)
  Echocardiogram 2D Echocardiogram has been performed.  Dominique Ochoa 04/04/2018, 9:08 AM

## 2018-04-04 NOTE — Anesthesia Preprocedure Evaluation (Signed)
Anesthesia Evaluation  Patient identified by MRN, date of birth, ID band Patient awake    Reviewed: Allergy & Precautions, NPO status , Patient's Chart, lab work & pertinent test results  Airway Mallampati: II  TM Distance: >3 FB Neck ROM: Full    Dental no notable dental hx.    Pulmonary asthma ,    Pulmonary exam normal breath sounds clear to auscultation       Cardiovascular hypertension, Pt. on medications Normal cardiovascular exam+ Valvular Problems/Murmurs  Rhythm:Regular Rate:Normal  Echo 04/04/2018  1. The left ventricle has normal systolic function with an ejection fraction of 60-65%. The cavity size was normal. There is mildly increased left ventricular wall thickness. Left ventricular diastolic Doppler parameters are consistent with impaired relaxation No evidence of left ventricular regional wall motion abnormalities.  2. The right ventricle has normal systolic function. The cavity was mildly enlarged. There is mildly increased right ventricular wall thickness.  3. Left atrial size was mildly dilated.  4. Right atrial size was mildly dilated.  5. The mitral valve is degenerative. Mild thickening of the mitral valve leaflet.  6. The tricuspid valve is normal in structure. Tricuspid valve regurgitation is mild-moderate.  7. The aortic valve is tricuspid Mild sclerosis of the aortic valve. Aortic valve regurgitation is trivial by color flow Doppler.  8. The inferior vena cava was dilated in size with >50% respiratory variability.  9. Small patent foramen ovale with predominantly left to right shunting across the atrial septum. 10. Evidence of atrial level shunting detected by color flow Doppler. 11. No evidence of left ventricular regional wall motion abnormalities. 12. The interatrial septum is aneurysmal.  SUMMARY LVEF 60-65%, mild LVH, normal wall motion, grade 1 DD, normal LV filling pressure, mild biatrial enlargment,  mild MR, mild to moderate TR, RVSP 50 mmHg, dilated IVC that collapses, atrial septal aneursym with small PFO noted   Neuro/Psych negative neurological ROS  negative psych ROS   GI/Hepatic Neg liver ROS, GERD  ,  Endo/Other  Hypothyroidism   Renal/GU Renal disease     Musculoskeletal negative musculoskeletal ROS (+)   Abdominal   Peds  Hematology  (+) anemia ,   Anesthesia Other Findings   Reproductive/Obstetrics negative OB ROS                             Anesthesia Physical Anesthesia Plan  ASA: III  Anesthesia Plan: General   Post-op Pain Management:    Induction: Intravenous  PONV Risk Score and Plan: 4 or greater and Ondansetron, Dexamethasone and Treatment may vary due to age or medical condition  Airway Management Planned: Oral ETT  Additional Equipment:   Intra-op Plan:   Post-operative Plan: Possible Post-op intubation/ventilation  Informed Consent: I have reviewed the patients History and Physical, chart, labs and discussed the procedure including the risks, benefits and alternatives for the proposed anesthesia with the patient or authorized representative who has indicated his/her understanding and acceptance.     Dental advisory given  Plan Discussed with: CRNA  Anesthesia Plan Comments:         Anesthesia Facundo Evaluation

## 2018-04-04 NOTE — Progress Notes (Signed)
CRITICAL VALUE ALERT  Critical Value:  Lactic Acid 2.0  Date & Time Notied: 04/04/2018 0034   Provider Notified: X blount Orders Received/Actions taken: awaiting orders  will continue to monitor patient.

## 2018-04-04 NOTE — Plan of Care (Signed)
Patient tolerating clear liquids, complains of some gas pain, medicated x 2 after surgery with improvement.  Walked to bathroom and sat up in chair postop.  Multiple family members at bedside.

## 2018-04-04 NOTE — Progress Notes (Signed)
PHARMACY - PHYSICIAN COMMUNICATION CRITICAL VALUE ALERT - BLOOD CULTURE IDENTIFICATION (BCID)  Dominique Ochoa is an 78 y.o. female who presented to [facility] on 04/03/2018 with a chief complaint of emesis, abdominal pain and chills. Abd US showed findings with concern for acute cholecystitis. Patient is scheduled for a lap chole with cholangiogram on 3/7.  Zosyn was started on adm for acute cholecystitis.  Name of physician (or Provider) Contacted: Dr. Wyonia Hough  Current antibiotics: zosyn  Changes to prescribed antibiotics recommended:  Continue zosyn  Results for orders placed or performed during the hospital encounter of 04/03/18  Blood Culture ID Panel (Reflexed) (Collected: 04/03/2018  2:20 PM)  Result Value Ref Range   Enterococcus species NOT DETECTED NOT DETECTED   Listeria monocytogenes NOT DETECTED NOT DETECTED   Staphylococcus species NOT DETECTED NOT DETECTED   Staphylococcus aureus (BCID) NOT DETECTED NOT DETECTED   Streptococcus species NOT DETECTED NOT DETECTED   Streptococcus agalactiae NOT DETECTED NOT DETECTED   Streptococcus pneumoniae NOT DETECTED NOT DETECTED   Streptococcus pyogenes NOT DETECTED NOT DETECTED   Acinetobacter baumannii NOT DETECTED NOT DETECTED   Enterobacteriaceae species DETECTED (A) NOT DETECTED   Enterobacter cloacae complex NOT DETECTED NOT DETECTED   Escherichia coli DETECTED (A) NOT DETECTED   Klebsiella oxytoca NOT DETECTED NOT DETECTED   Klebsiella pneumoniae NOT DETECTED NOT DETECTED   Proteus species NOT DETECTED NOT DETECTED   Serratia marcescens NOT DETECTED NOT DETECTED   Carbapenem resistance NOT DETECTED NOT DETECTED   Haemophilus influenzae NOT DETECTED NOT DETECTED   Neisseria meningitidis NOT DETECTED NOT DETECTED   Pseudomonas aeruginosa NOT DETECTED NOT DETECTED   Candida albicans NOT DETECTED NOT DETECTED   Candida glabrata NOT DETECTED NOT DETECTED   Candida krusei NOT DETECTED NOT DETECTED   Candida parapsilosis NOT DETECTED  NOT DETECTED   Candida tropicalis NOT DETECTED NOT DETECTED    [name] P 04/04/2018  11:23 AM

## 2018-04-04 NOTE — Anesthesia Procedure Notes (Signed)
Procedure Name: Intubation Date/Time: 04/04/2018 11:20 AM Performed by: Elyn Peers, CRNA Pre-anesthesia Checklist: Patient identified, Emergency Drugs available, Suction available, Patient being monitored and Timeout performed Patient Re-evaluated:Patient Re-evaluated prior to induction Oxygen Delivery Method: Circle system utilized Preoxygenation: Pre-oxygenation with 100% oxygen Induction Type: IV induction Ventilation: Mask ventilation without difficulty Laryngoscope Size: Miller and 3 Grade View: Grade I Tube type: Oral Tube size: 7.5 mm Airway Equipment and Method: Stylet Placement Confirmation: ETT inserted through vocal cords under direct vision,  positive ETCO2 and breath sounds checked- equal and bilateral Secured at: 22 cm Tube secured with: Tape Dental Injury: Teeth and Oropharynx as per pre-operative assessment

## 2018-04-04 NOTE — Op Note (Signed)
04/04/2018  12:38 PM  PATIENT:  Janhavi Guye, 78 y.o., female, MRN: [id number]  PREOP DIAGNOSIS:  Gallstones, cholecystitis  POSTOP DIAGNOSIS:   Cholecystitis, cholelithiasis, cholangitis,  Bilateral lower abdomina wall hernias (?inguinal?)  PROCEDURE:   Procedure(s):  LAPAROSCOPIC CHOLECYSTECTOMY WITH INTRAOPERATIVE CHOLANGIOGRAM  SURGEON:   Ovidio Kin, M.D.  Threasa HeadsDarnelle Spangle, M.D.  ANESTHESIA:   general  Anesthesiologist: Lewie Loron, MD CRNA: Elyn Peers, CRNA  General  ASA: III E  EBL:  minimal  ml  BLOOD ADMINISTERED: none  DRAINS: none   LOCAL MEDICATIONS USED:   30 cc of 1/4% marcaine  SPECIMEN:   Gall bladder  COUNTS CORRECT:  YES  INDICATIONS FOR PROCEDURE:  Darrielle Schoening is a 78 y.o. (DOB: [birth year]) AA female whose primary care physician is Albertina Senegal, MD and comes for cholecystectomy.  She presently acutely to the [facility].  Her admission WBC was 9,900 yesterday, but jumped to 27,800 this AM.  Dr.Hilty saw the patient for cardiology this AM.   The indications and risks of the gall bladder surgery were explained to the patient.  The risks include, but are not limited to, infection, bleeding, common bile duct injury and open surgery.  SURGERY:  The patient was taken to OR room #1 at [facility].  The abdomen was prepped with chloroprep.  The patient was given Zosyn prior to the beginning of the operation.   A time out was held and the surgical checklist run.   An infraumbilical incision was made into the abdominal cavity.  A 12 mm Hasson trocar was inserted into the abdominal cavity through the infraumbilical incision and secured with a 0 Vicryl suture.  Three additional trocars were inserted: a 10 mm trocar in the sub-xiphoid location, a 5 mm trocar in the right mid subcostal area, and a 5 mm trocar in the right lateral subcostal area.   The abdomen was explored and the liver, stomach, and bowel that could be seen were unremarkable.   She did have bilateral lower abdominal wall hernias (?inguinal?).  I tried to take photos of these hernias, but the camera was not working.   The gall bladder was encased in omentum and had edema consistent with acute inflammation..   I grasped the gall bladder and rotated it cephalad.  Disssection was carried down to the gall bladder/cystic duct junction and the cystic duct isolated.  The gall bladder was torn elevating it and purulent bile leak from the hole.  A clip was placed on the gall bladder side of the cystic duct.   On cutting the cystic duct, both purulence fluid and debri floated back from the common bile duct through the cut cystic duct.  This is consistent with cholangitis and explains in part her elevated WBC.  An intra-operative cholangiogram was shot.   The intra-operative cholangiogram was shot using a cut off Taut catheter placed through a 14 gauge angiocath in the RUQ.  The Taut catheter was inserted in the cut cystic duct and secured with an endoclip.  A cholangiogram was shot with 10 cc of 1/2 strength Isoview.  Using fluoroscopy, the cholangiogram showed the flow of contrast into the common bile duct, up the hepatic radicals, and into the duodenum.  There was no mass or obstruction.  This was a normal intra-operative cholangiogram.   The Taut catheter was removed.  The cystic duct was tripley endoclipped and the cystic artery was identified and clipped.  The gall bladder was bluntly and sharpley dissected  from the gall bladder bed.   After the gall bladder was removed from the liver, the gall bladder bed and [place] were inspected.  There was no bleeding or bile leak.  The gall bladder was placed in a Ecco Sac bag and delivered through the umbilicus.  The abdomen was irrigated with 1,000 cc saline.   The trocars were then removed.  I infiltrated 30cc of 1/4% Marcaine into the incisions.  The umbilical port closed with a 0 Vicryl suture and the skin closed with 4-0 Monocryl.   The skin was painted with DermaBond.  The patient's sponge and needle count were correct.  The patient was transported to the RR in good condition.   Of note, because of the cholangitis, she would be best served with one week of antibiotics.  And she will need further evaluation of the lower abdominal wall hernias.  Ovidio Kin, MD, [facility] Pager: [phone] Office phone:  [phone]

## 2018-04-04 NOTE — Progress Notes (Signed)
PROGRESS NOTE  Dominique Ochoa SAY:[id number] DOB: 1941-01-27 DOA: 04/03/2018 PCP: Drake Leach, MD   LOS: 1 day   Brief Narrative / Interim history: Dominique Ochoa is a 78 y.o. female with medical history significant of hypertension, asthma, GERD, hypothyroidism, who presents with nausea, vomiting, abdominal pain, fever and chills.  Her symptoms started the morning of admission.  Her abdominal pain was in the epigastric area, nonradiating.  She was admitted to the hospital and was found to have elevated liver enzymes, and right upper quadrant ultrasound showed multiple gallstones with positive sonographic Murphy signs concerning for acute cholecystitis.  General surgery was consulted and plans are in place for patient to undergo cholecystectomy.  Subjective: Feeling better this morning, no abdominal pain, no fever or chills.  Assessment & Plan: Principal Problem:   Acute cholecystitis Active Problems:   HTN (hypertension)   Hypothyroidism   Asthma   GERD (gastroesophageal reflux disease)   Normocytic anemia   Sepsis (HCC)   Gallstone   Elevated troponin   Principal Problem Sepsis due to acute cholecystitis -Patient was started on Zosyn, continue.  Met criteria for sepsis with fever, tachycardia, tachypnea.  Her white count has significantly increased today -Patient is to get cholecystectomy pending cardiology clearance  Active Problems Hypertension -Hold home medications due to risk of developing hypotension secondary to sepsis  Hypothyroidism -continue Synthroid  Elevated troponin -Likely demand, admitting MD consulted cardiology, she is now cleared for surgery as her echo is normal  Normocytic anemia -Follow CBC  Asthma -Stable, no wheezing, continue PRN's   Scheduled Meds: . aspirin EC  81 mg Oral Daily  . levothyroxine  25 mcg Oral QAC breakfast  . loratadine  10 mg Oral Daily  . multivitamin with minerals  1 tablet Oral Daily   Continuous Infusions: . sodium  chloride 125 mL/hr at 04/04/18 0903  . piperacillin-tazobactam (ZOSYN)  IV 12.5 mL/hr at 04/04/18 0600   PRN Meds:.hydrALAZINE, ibuprofen, levalbuterol, morphine injection, nitroGLYCERIN, ondansetron **OR** ondansetron (ZOFRAN) IV, pantoprazole, senna-docusate  DVT prophylaxis: SCDs Code Status: Full code Family Communication: Daughter present at bedside Disposition Plan: To be determined  Consultants:   General surgery  Cardiology  Procedures:   2D echo:  IMPRESSIONS    1. The left ventricle has normal systolic function with an ejection fraction of 60-65%. The cavity size was normal. There is mildly increased left ventricular wall thickness. Left ventricular diastolic Doppler parameters are consistent with impaired  relaxation No evidence of left ventricular regional wall motion abnormalities.  2. The right ventricle has normal systolic function. The cavity was mildly enlarged. There is mildly increased right ventricular wall thickness.  3. Left atrial size was mildly dilated.  4. Right atrial size was mildly dilated.  5. The mitral valve is degenerative. Mild thickening of the mitral valve leaflet.  6. The tricuspid valve is normal in structure. Tricuspid valve regurgitation is mild-moderate.  7. The aortic valve is tricuspid Mild sclerosis of the aortic valve. Aortic valve regurgitation is trivial by color flow Doppler.  8. The inferior vena cava was dilated in size with >50% respiratory variability.  9. Small patent foramen ovale with predominantly left to right shunting across the atrial septum. 10. Evidence of atrial level shunting detected by color flow Doppler. 11. No evidence of left ventricular regional wall motion abnormalities. 12. The interatrial septum is aneurysmal.  Antimicrobials:  Zosyn 3/6 >>   Objective: Vitals:   04/03/18 1830 04/03/18 1946 04/03/18 2256 04/04/18 0525  BP: 121/66 (!) 129/56 Marland Kitchen)  113/54 127/64  Pulse: 96 (!) 102 93 95  Resp: (!) 26 15  (!) 25 (!) 22  Temp:  99.4 F (37.4 C) 99.7 F (37.6 C) 99.2 F (37.3 C)  TempSrc:  Oral Oral Oral  SpO2: 95% 97% 98% 92%  Weight:  76.7 kg    Height:  '5\' 5"'$  (1.651 m)      Intake/Output Summary (Last 24 hours) at 04/04/2018 1025 Last data filed at 04/04/2018 0600 Gross per 24 hour  Intake 1478.9 ml  Output -  Net 1478.9 ml   Filed Weights   04/03/18 1401 04/03/18 1946  Weight: 72.1 kg 76.7 kg    Examination:  Constitutional: NAD Eyes: PERRL ENMT: Mucous membranes are moist.  Neck: normal, supple, no masses, no thyromegaly Respiratory: clear to auscultation bilaterally, no wheezing, no crackles.  Cardiovascular: Regular rate and rhythm, no murmurs / rubs / gallops. No LE edema.  Abdomen: no tenderness. Bowel sounds positive.  Skin: no rashes Neurologic: CN 2-12 grossly intact. Strength 5/5 in all 4.  Psychiatric: Normal judgment and insight. Alert and oriented x 3. Normal mood.    Data Reviewed: I have independently reviewed following labs and imaging studies   CBC: Recent Labs  Lab 04/03/18 1429 04/04/18 0222  WBC 9.9 27.8*  NEUTROABS 8.8*  --   HGB 10.9* 8.8*  HCT 36.3 29.1*  MCV 81.6 83.6  PLT 294 144   Basic Metabolic Panel: Recent Labs  Lab 04/03/18 1429 04/04/18 0222  NA 137 139  K 3.6 3.4*  CL 106 113*  CO2 22 21*  GLUCOSE 150* 99  BUN 18 18  CREATININE 1.00 1.10*  CALCIUM 8.8* 7.7*   GFR: Estimated Creatinine Clearance: 42.5 mL/min (A) (by C-G formula based on SCr of 1.1 mg/dL (H)). Liver Function Tests: Recent Labs  Lab 04/03/18 1429 04/04/18 0222  AST 570* 358*  ALT 149* 194*  ALKPHOS 288* 201*  BILITOT 1.2 2.9*  PROT 7.1 5.7*  ALBUMIN 3.6 2.8*   Recent Labs  Lab 04/03/18 1429  LIPASE 57*   No results for input(s): AMMONIA in the last 168 hours. Coagulation Profile: Recent Labs  Lab 04/03/18 2105  INR 1.2   Cardiac Enzymes: Recent Labs  Lab 04/03/18 1429 04/03/18 2105 04/04/18 0222  TROPONINI 0.03* 0.05* 0.04*   0.04*   BNP (last 3 results) No results for input(s): PROBNP in the last 8760 hours. HbA1C: Recent Labs    04/04/18 0222  HGBA1C 5.8*   CBG: No results for input(s): GLUCAP in the last 168 hours. Lipid Profile: Recent Labs    04/04/18 0222  CHOL 98  HDL 40*  LDLCALC 53  TRIG 27  CHOLHDL 2.5   Thyroid Function Tests: No results for input(s): TSH, T4TOTAL, FREET4, T3FREE, THYROIDAB in the last 72 hours. Anemia Panel: No results for input(s): VITAMINB12, FOLATE, FERRITIN, TIBC, IRON, RETICCTPCT in the last 72 hours. Urine analysis:    Component Value Date/Time   COLORURINE YELLOW 04/03/2018 1513   APPEARANCEUR HAZY (A) 04/03/2018 1513   LABSPEC 1.015 04/03/2018 1513   PHURINE 6.0 04/03/2018 1513   GLUCOSEU NEGATIVE 04/03/2018 1513   HGBUR NEGATIVE 04/03/2018 1513   BILIRUBINUR NEGATIVE 04/03/2018 1513   [place] 04/03/2018 1513   PROTEINUR NEGATIVE 04/03/2018 1513   UROBILINOGEN 1.0 12/03/2013 2108   NITRITE POSITIVE (A) 04/03/2018 1513   LEUKOCYTESUR NEGATIVE 04/03/2018 1513   Sepsis Labs: Invalid input(s): PROCALCITONIN, LACTICIDVEN  Recent Results (from the past 240 hour(s))  Culture, blood (routine x 2)  Status: None (Preliminary result)   Collection Time: 04/03/18  2:20 PM  Result Value Ref Range Status   Specimen Description   Final    BLOOD LEFT ANTECUBITAL Performed at [facility], [address] [place], [place] [address]    Special Requests   Final    BOTTLES DRAWN AEROBIC AND ANAEROBIC Blood Culture adequate volume Performed at [facility], [address] [place], [place] [address]    Culture  Setup Time Organism ID to follow ANAEROBIC BOTTLE ONLY   Final   Culture   Final    NO GROWTH < 12 HOURS Performed at [facility], [place] [address] [place], [place] [address]    Report Status PENDING  Incomplete  Surgical pcr screen     Status: None   Collection Time: 04/04/18  7:17 AM  Result Value Ref Range  Status   MRSA, PCR NEGATIVE NEGATIVE Final   Staphylococcus aureus NEGATIVE NEGATIVE Final    Comment: (NOTE) The Xpert SA Assay (FDA approved for NASAL specimens in patients 61 years of age and older), is one component of a comprehensive surveillance program. It is not intended to diagnose infection nor to guide or monitor treatment. Performed at [facility], [place] [address], [place], [place] [address]       Radiology Studies: Dg Abdomen Acute W/chest  Result Date: 04/03/2018 CLINICAL DATA:  Nausea and vomiting and chills since 10 a.m. today. EXAM: DG ABDOMEN ACUTE W/ 1V CHEST COMPARISON:  Chest radiographs dated 04/20/2017. FINDINGS: The cardiac silhouette remains borderline enlarged. No significant change in a small amount of linear scarring at the left lateral lung base. Minimal linear scarring and probable pleural thickening at the right lateral lung base. Otherwise, clear lungs. Normal bowel gas pattern without free peritoneal air. Bilateral pelvic phleboliths. Additional left mid abdominal probable phleboliths. No definite urinary tract calculi visualized. Mild thoracolumbar scoliosis and mild lumbar spine degenerative changes. Mild left glenohumeral degenerative spur formation. Superior migration of both humeral heads abutting the acromion on each side with bony remodeling of the acromion, compatible with large, chronic bilateral rotator cuff tears. IMPRESSION: No acute abnormality. Electronically Signed   By: Claudie Revering M.D.   On: 04/03/2018 15:19   US Abdomen Limited Ruq  Result Date: 04/03/2018 CLINICAL DATA:  Epigastric pain, nausea and vomiting EXAM: ULTRASOUND ABDOMEN LIMITED RIGHT UPPER QUADRANT COMPARISON:  None. FINDINGS: Gallbladder: Multiple shadowing stones measuring up to 2 cm. Positive sonographic Murphy. Wall thickness of 3 mm. Small amount of pericholecystic fluid. Common bile duct: Diameter: Dilated at 8 mm. Liver: No focal lesion identified. Within  normal limits in parenchymal echogenicity. Portal vein is patent on color Doppler imaging with normal direction of blood flow towards the liver. Possible small amount of fluid adjacent to the liver IMPRESSION: 1. Multiple gallstones with positive sonographic Murphy and small amount of pericholecystic fluid concerning for an acute cholecystitis. 2. Dilated common bile duct measuring up to 8 mm. If ductal stone or obstruction is suspected, further evaluation with MRCP could be obtained. Electronically Signed   By: Donavan Foil M.D.   On: 04/03/2018 16:46    Marzetta Board, MD, PhD [facility]  Contact via  [email or web address]  [place] P: [phone]  F: [phone]

## 2018-04-04 NOTE — Transfer of Care (Signed)
Immediate Anesthesia Transfer of Care Note  Patient: Dominique Ochoa  Procedure(s) Performed: LAPAROSCOPIC CHOLECYSTECTOMY WITH INTRAOPERATIVE CHOLANGIOGRAM (N/A Abdomen)  Patient Location: PACU  Anesthesia Type:General  Level of Consciousness: awake and responds to stimulation  Airway & Oxygen Therapy: Patient Spontanous Breathing and Patient connected to face mask oxygen  Post-op Assessment: Report given to RN and Post -op Vital signs reviewed and stable  Post vital signs: Reviewed and stable  Last Vitals:  Vitals Value Taken Time  BP 126/73 04/04/2018 12:46 PM  Temp    Pulse 101 04/04/2018 12:48 PM  Resp 17 04/04/2018 12:48 PM  SpO2 96 % 04/04/2018 12:48 PM    Last Pain:  Vitals:   04/04/18 0525  TempSrc: Oral  PainSc:          Complications: No apparent anesthesia complications

## 2018-04-04 NOTE — Progress Notes (Signed)
Seen by Dr. Rennis Golden with cardiology.  Echo shows 60-65% EF.  Moderate pulmonary HTN.  Cleared by cardiology.  Will go ahead with surgery today. Family at bedside.  Ovidio Kin, MD, [facility] Pager: [phone] Office phone:  [phone]

## 2018-04-04 NOTE — Anesthesia Postprocedure Evaluation (Signed)
Anesthesia Post Note  Patient: Dominique Ochoa  Procedure(s) Performed: LAPAROSCOPIC CHOLECYSTECTOMY WITH INTRAOPERATIVE CHOLANGIOGRAM (N/A Abdomen)     Patient location during evaluation: PACU Anesthesia Type: General Level of consciousness: sedated and patient cooperative Pain management: pain level controlled Vital Signs Assessment: post-procedure vital signs reviewed and stable Respiratory status: spontaneous breathing Cardiovascular status: stable Anesthetic complications: no    Last Vitals:  Vitals:   04/04/18 1315 04/04/18 1330  BP: (!) 143/75   Pulse: 94 93  Resp: 12 14  Temp:    SpO2: 94% 91%    Last Pain:  Vitals:   04/04/18 1315  TempSrc:   PainSc: 0-No pain                 Lewie Loron

## 2018-04-05 ENCOUNTER — Encounter (HOSPITAL_COMMUNITY): Payer: Self-pay | Admitting: Surgery

## 2018-04-05 LAB — CBC
HCT: 31.6 % — ABNORMAL LOW (ref 36.0–46.0)
Hemoglobin: 9.3 g/dL — ABNORMAL LOW (ref 12.0–15.0)
MCH: 24.7 pg — ABNORMAL LOW (ref 26.0–34.0)
MCHC: 29.4 g/dL — ABNORMAL LOW (ref 30.0–36.0)
MCV: 84 fL (ref 80.0–100.0)
PLATELETS: 219 10*3/uL (ref 150–400)
RBC: 3.76 MIL/uL — AB (ref 3.87–5.11)
RDW: 16.8 % — ABNORMAL HIGH (ref 11.5–15.5)
WBC: 18 10*3/uL — ABNORMAL HIGH (ref 4.0–10.5)
nRBC: 0 % (ref 0.0–0.2)

## 2018-04-05 LAB — COMPREHENSIVE METABOLIC PANEL
ALT: 138 U/L — ABNORMAL HIGH (ref 0–44)
AST: 162 U/L — ABNORMAL HIGH (ref 15–41)
Albumin: 2.7 g/dL — ABNORMAL LOW (ref 3.5–5.0)
Alkaline Phosphatase: 146 U/L — ABNORMAL HIGH (ref 38–126)
Anion gap: 5 (ref 5–15)
BUN: 20 mg/dL (ref 8–23)
CALCIUM: 8.2 mg/dL — AB (ref 8.9–10.3)
CO2: 22 mmol/L (ref 22–32)
Chloride: 112 mmol/L — ABNORMAL HIGH (ref 98–111)
Creatinine, Ser: 1.2 mg/dL — ABNORMAL HIGH (ref 0.44–1.00)
GFR calc Af Amer: 50 mL/min — ABNORMAL LOW (ref >60)
GFR calc non Af Amer: 43 mL/min — ABNORMAL LOW (ref >60)
Glucose, Bld: 149 mg/dL — ABNORMAL HIGH (ref 70–99)
Potassium: 4.5 mmol/L (ref 3.5–5.1)
Sodium: 139 mmol/L (ref 135–145)
Total Bilirubin: 2.2 mg/dL — ABNORMAL HIGH (ref 0.3–1.2)
Total Protein: 6.1 g/dL — ABNORMAL LOW (ref 6.5–8.1)

## 2018-04-05 LAB — URINE CULTURE: Culture: >=100000 — AB

## 2018-04-05 MED ORDER — ENOXAPARIN SODIUM 40 MG/0.4ML ~~LOC~~ SOLN
40.0000 mg | Freq: Every day | SUBCUTANEOUS | Status: DC
  Administered 2018-04-05: 40 mg via SUBCUTANEOUS
  Filled 2018-04-05 (×2): qty 0.4

## 2018-04-05 MED ORDER — DEXTROSE-NACL 5-0.45 % IV SOLN
INTRAVENOUS | Status: DC
  Administered 2018-04-05 (×2): via INTRAVENOUS

## 2018-04-05 NOTE — Plan of Care (Signed)
Patient tolerating regular diet, postop pain controlled with Ibuprofen.  Walked multiple times in the hall, family members at bedside.

## 2018-04-05 NOTE — Progress Notes (Signed)
PROGRESS NOTE  [name] JQZ:[id number] DOB: 20-Oct-1940 DOA: 04/03/2018 PCP: Drake Leach, MD   LOS: 2 days   Brief Narrative / Interim history: Dominique Ochoa is a 78 y.o. female with medical history significant of hypertension, asthma, GERD, hypothyroidism, who presents with nausea, vomiting, abdominal pain, fever and chills.  Her symptoms started the morning of admission.  Her abdominal pain was in the epigastric area, nonradiating.  She was admitted to the hospital and was found to have elevated liver enzymes, and right upper quadrant ultrasound showed multiple gallstones with positive sonographic Murphy signs concerning for acute cholecystitis.  General surgery was consulted and plans are in place for patient to undergo cholecystectomy.  Subjective: Recovered well postop, had a little bit of abdominal soreness last night but much improved this morning.  Tolerating a clear liquid diet.  Assessment & Plan: Principal Problem:   Acute cholecystitis Active Problems:   HTN (hypertension)   Hypothyroidism   Asthma   GERD (gastroesophageal reflux disease)   Normocytic anemia   Sepsis (HCC)   Gallstone   Elevated troponin   Principal Problem Sepsis due to acute cholecystitis with ascending cholangitis -Patient was started on Zosyn, continue.  Met criteria for sepsis with fever, tachycardia, tachypnea.  Her white count has significantly increased today -She is status post cholecystectomy on 3/7, recovered well postop.  She still has a significant leukocytosis with a white count of 18 K, continue intravenous antibiotics  Active Problems Hypertension -Blood pressure normal this morning, continue to hold home medications  Hypothyroidism -continue Synthroid  Elevated troponin -Likely demand ischemia in the setting of sepsis.  Cardiology saw patient on admission.  2D echo is normal as below.  Normocytic anemia -Hemoglobin stable  Asthma -Stable, no wheezing, continue  PRN's   Scheduled Meds: . aspirin EC  81 mg Oral Daily  . enoxaparin (LOVENOX) injection  40 mg Subcutaneous Daily  . levothyroxine  25 mcg Oral QAC breakfast  . loratadine  10 mg Oral Daily  . multivitamin with minerals  1 tablet Oral Daily   Continuous Infusions: . dextrose 5 % and 0.45% NaCl 75 mL/hr at 04/05/18 0730  . piperacillin-tazobactam (ZOSYN)  IV 3.375 g (04/05/18 0614)   PRN Meds:.hydrALAZINE, ibuprofen, levalbuterol, morphine injection, nitroGLYCERIN, ondansetron **OR** ondansetron (ZOFRAN) IV, pantoprazole, senna-docusate, traMADol  DVT prophylaxis: SCDs Code Status: Full code Family Communication: Daughter present at bedside Disposition Plan: Home tomorrow  Consultants:   General surgery  Cardiology  Procedures:   Laparoscopic cholecystectomy 3/7 with Dr. Lucia Gaskins   2D echo:  IMPRESSIONS    1. The left ventricle has normal systolic function with an ejection fraction of 60-65%. The cavity size was normal. There is mildly increased left ventricular wall thickness. Left ventricular diastolic Doppler parameters are consistent with impaired  relaxation No evidence of left ventricular regional wall motion abnormalities.  2. The right ventricle has normal systolic function. The cavity was mildly enlarged. There is mildly increased right ventricular wall thickness.  3. Left atrial size was mildly dilated.  4. Right atrial size was mildly dilated.  5. The mitral valve is degenerative. Mild thickening of the mitral valve leaflet.  6. The tricuspid valve is normal in structure. Tricuspid valve regurgitation is mild-moderate.  7. The aortic valve is tricuspid Mild sclerosis of the aortic valve. Aortic valve regurgitation is trivial by color flow Doppler.  8. The inferior vena cava was dilated in size with >50% respiratory variability.  9. Small patent foramen ovale with predominantly left to right shunting  across the atrial septum. 10. Evidence of atrial level  shunting detected by color flow Doppler. 11. No evidence of left ventricular regional wall motion abnormalities. 12. The interatrial septum is aneurysmal.  Antimicrobials:  Zosyn 3/6 >>   Objective: Vitals:   04/04/18 1342 04/04/18 1356 04/04/18 2049 04/05/18 0544  BP: 137/73 135/73 127/77 135/80  Pulse: 91 90 99 85  Resp: (!) 25 (!) 22 (!) 22 18  Temp: 98.2 F (36.8 C) 98 F (36.7 C) 99.2 F (37.3 C) 98 F (36.7 C)  TempSrc:  Oral Oral Oral  SpO2: 93% 95% 92% 96%  Weight:      Height:        Intake/Output Summary (Last 24 hours) at 04/05/2018 1015 Last data filed at 04/05/2018 0300 Gross per 24 hour  Intake 1886.63 ml  Output 10 ml  Net 1876.63 ml   Filed Weights   04/03/18 1401 04/03/18 1946  Weight: 72.1 kg 76.7 kg    Examination:  Constitutional: No distress, about to order breakfast Eyes: No scleral icterus ENMT: Moist mucous membranes Neck: normal, supple Respiratory: Clear to auscultation bilaterally, no wheezing, no crackles Cardiovascular: Regular rate and rhythm, no murmurs appreciated.  No peripheral edema Abdomen: Soft, nontender, bowel sounds positive, surgical sites clean Neurologic: Nonfocal, equal strength Psychiatric: Normal judgment and insight. Alert and oriented x 3. Normal mood.    Data Reviewed: I have independently reviewed following labs and imaging studies   CBC: Recent Labs  Lab 04/03/18 1429 04/04/18 0222 04/05/18 0432  WBC 9.9 27.8* 18.0*  NEUTROABS 8.8*  --   --   HGB 10.9* 8.8* 9.3*  HCT 36.3 29.1* 31.6*  MCV 81.6 83.6 84.0  PLT 294 234 354   Basic Metabolic Panel: Recent Labs  Lab 04/03/18 1429 04/04/18 0222 04/05/18 0432  NA 137 139 139  K 3.6 3.4* 4.5  CL 106 113* 112*  CO2 22 21* 22  GLUCOSE 150* 99 149*  BUN _0 CREATININE 1.00 1.10* 1.20*  CALCIUM 8.8* 7.7* 8.2*   GFR: Estimated Creatinine Clearance: 38.9 mL/min (A) (by C-G formula based on SCr of 1.2 mg/dL (H)). Liver Function Tests: Recent Labs   Lab 04/03/18 1429 04/04/18 0222 04/05/18 0432  AST 570* 358* 162*  ALT 149* 194* 138*  ALKPHOS 288* 201* 146*  BILITOT 1.2 2.9* 2.2*  PROT 7.1 5.7* 6.1*  ALBUMIN 3.6 2.8* 2.7*   Recent Labs  Lab 04/03/18 1429  LIPASE 57*   No results for input(s): AMMONIA in the last 168 hours. Coagulation Profile: Recent Labs  Lab 04/03/18 2105  INR 1.2   Cardiac Enzymes: Recent Labs  Lab 04/03/18 1429 04/03/18 2105 04/04/18 0222  TROPONINI 0.03* 0.05* 0.04*  0.04*   BNP (last 3 results) No results for input(s): PROBNP in the last 8760 hours. HbA1C: Recent Labs    04/04/18 0222  HGBA1C 5.8*   CBG: No results for input(s): GLUCAP in the last 168 hours. Lipid Profile: Recent Labs    04/04/18 0222  CHOL 98  HDL 40*  LDLCALC 53  TRIG 27  CHOLHDL 2.5   Thyroid Function Tests: No results for input(s): TSH, T4TOTAL, FREET4, T3FREE, THYROIDAB in the last 72 hours. Anemia Panel: No results for input(s): VITAMINB12, FOLATE, FERRITIN, TIBC, IRON, RETICCTPCT in the last 72 hours. Urine analysis:    Component Value Date/Time   COLORURINE YELLOW 04/03/2018 1513   APPEARANCEUR HAZY (A) 04/03/2018 1513   LABSPEC 1.015 04/03/2018 1513   PHURINE 6.0 04/03/2018 1513  GLUCOSEU NEGATIVE 04/03/2018 [place] 04/03/2018 1513   [place] 04/03/2018 1513   [place] 04/03/2018 1513   PROTEINUR NEGATIVE 04/03/2018 1513   UROBILINOGEN 1.0 12/03/2013 2108   NITRITE POSITIVE (A) 04/03/2018 1513   LEUKOCYTESUR NEGATIVE 04/03/2018 1513   Sepsis Labs: Invalid input(s): PROCALCITONIN, LACTICIDVEN  Recent Results (from the past 240 hour(s))  Culture, blood (routine x 2)     Status: Abnormal (Preliminary result)   Collection Time: 04/03/18  2:20 PM  Result Value Ref Range Status   Specimen Description   Final    BLOOD LEFT ANTECUBITAL Performed at [facility], [address] [place], [place] [address]    Special Requests   Final     BOTTLES DRAWN AEROBIC AND ANAEROBIC Blood Culture adequate volume Performed at [facility], [address] [place], [place] [address]    Culture  Setup Time   Final    IN BOTH AEROBIC AND ANAEROBIC BOTTLES GRAM NEGATIVE RODS CRITICAL RESULT CALLED TO, READ BACK BY AND VERIFIED WITH: PHARMD A PHAN [id number] AT 7106 BY CM Performed at [facility], [place] [address], [place], [place] [address]    Culture ESCHERICHIA COLI (A)  Final   Report Status PENDING  Incomplete  Blood Culture ID Panel (Reflexed)     Status: Abnormal   Collection Time: 04/03/18  2:20 PM  Result Value Ref Range Status   Enterococcus species NOT DETECTED NOT DETECTED Final   Listeria monocytogenes NOT DETECTED NOT DETECTED Final   Staphylococcus species NOT DETECTED NOT DETECTED Final   Staphylococcus aureus (BCID) NOT DETECTED NOT DETECTED Final   Streptococcus species NOT DETECTED NOT DETECTED Final   Streptococcus agalactiae NOT DETECTED NOT DETECTED Final   Streptococcus pneumoniae NOT DETECTED NOT DETECTED Final   Streptococcus pyogenes NOT DETECTED NOT DETECTED Final   Acinetobacter baumannii NOT DETECTED NOT DETECTED Final   Enterobacteriaceae species DETECTED (A) NOT DETECTED Final    Comment: Enterobacteriaceae represent a large family of gram-negative bacteria, not a single organism. CRITICAL RESULT CALLED TO, READ BACK BY AND VERIFIED WITH: PHARMD A PHAN [id number] AT 1120 BY CM    Enterobacter cloacae complex NOT DETECTED NOT DETECTED Final   Escherichia coli DETECTED (A) NOT DETECTED Final    Comment: CRITICAL RESULT CALLED TO, READ BACK BY AND VERIFIED WITH: PHARMD A PHAN [id number] AT 1120 BY CM    Klebsiella oxytoca NOT DETECTED NOT DETECTED Final   Klebsiella pneumoniae NOT DETECTED NOT DETECTED Final   Proteus species NOT DETECTED NOT DETECTED Final   Serratia marcescens NOT DETECTED NOT DETECTED Final   Carbapenem resistance NOT DETECTED NOT DETECTED Final   Haemophilus influenzae NOT  DETECTED NOT DETECTED Final   Neisseria meningitidis NOT DETECTED NOT DETECTED Final   Pseudomonas aeruginosa NOT DETECTED NOT DETECTED Final   Candida albicans NOT DETECTED NOT DETECTED Final   Candida glabrata NOT DETECTED NOT DETECTED Final   Candida krusei NOT DETECTED NOT DETECTED Final   Candida parapsilosis NOT DETECTED NOT DETECTED Final   Candida tropicalis NOT DETECTED NOT DETECTED Final    Comment: Performed at [facility], [place] [address] [place], [place] [address]  Urine culture     Status: Abnormal   Collection Time: 04/03/18  3:13 PM  Result Value Ref Range Status   Specimen Description   Final    URINE, RANDOM Performed at [facility], [address], [place], [place] [address]  Special Requests   Final    NONE Performed at [facility], [address] [place], [place] [address]    Culture >=100,000 COLONIES/mL KLEBSIELLA PNEUMONIAE (A)  Final   Report Status 04/05/2018 FINAL  Final   Organism ID, Bacteria KLEBSIELLA PNEUMONIAE (A)  Final      Susceptibility   Klebsiella pneumoniae - MIC*    AMPICILLIN RESISTANT Resistant     CEFAZOLIN <=4 SENSITIVE Sensitive     CEFTRIAXONE <=1 SENSITIVE Sensitive     CIPROFLOXACIN <=0.25 SENSITIVE Sensitive     GENTAMICIN <=1 SENSITIVE Sensitive     IMIPENEM 1 SENSITIVE Sensitive     NITROFURANTOIN 64 INTERMEDIATE Intermediate     TRIMETH/SULFA <=20 SENSITIVE Sensitive     AMPICILLIN/SULBACTAM 4 SENSITIVE Sensitive     PIP/TAZO <=4 SENSITIVE Sensitive     Extended ESBL NEGATIVE Sensitive     * >=100,000 COLONIES/mL KLEBSIELLA PNEUMONIAE  Surgical pcr screen     Status: None   Collection Time: 04/04/18  7:17 AM  Result Value Ref Range Status   MRSA, PCR NEGATIVE NEGATIVE Final   Staphylococcus aureus NEGATIVE NEGATIVE Final    Comment: (NOTE) The Xpert SA Assay (FDA approved for NASAL specimens in patients 92 years of age and older), is one component of a comprehensive surveillance  program. It is not intended to diagnose infection nor to guide or monitor treatment. Performed at [facility], [place] [address] [place], [place] [address]       Radiology Studies: Dg Cholangiogram Operative  Result Date: 04/04/2018 CLINICAL DATA:  Gallstones. EXAM: INTRAOPERATIVE CHOLANGIOGRAM TECHNIQUE: Cholangiographic images from the C-arm fluoroscopic device were submitted for interpretation post-operatively. Please see the procedural report for the amount of contrast and the fluoroscopy time utilized. COMPARISON:  Ultrasound 04/03/2018 FINDINGS: Contrast opacification the biliary system. Contrast refluxes into the intrahepatic bile ducts. Mild dilatation of the extrahepatic biliary system. Multiple filling defects are suggestive for gas bubbles. No evidence for an obstructive stone. Contrast drains into the duodenum. IMPRESSION: Mild dilatation of the biliary system without an obstructing lesion. Filling defects are most compatible with gas bubbles. Electronically Signed   By: Markus Daft M.D.   On: 04/04/2018 14:33   Dg Abdomen Acute W/chest  Result Date: 04/03/2018 CLINICAL DATA:  Nausea and vomiting and chills since 10 a.m. today. EXAM: DG ABDOMEN ACUTE W/ 1V CHEST COMPARISON:  Chest radiographs dated 04/20/2017. FINDINGS: The cardiac silhouette remains borderline enlarged. No significant change in a small amount of linear scarring at the left lateral lung base. Minimal linear scarring and probable pleural thickening at the right lateral lung base. Otherwise, clear lungs. Normal bowel gas pattern without free peritoneal air. Bilateral pelvic phleboliths. Additional left mid abdominal probable phleboliths. No definite urinary tract calculi visualized. Mild thoracolumbar scoliosis and mild lumbar spine degenerative changes. Mild left glenohumeral degenerative spur formation. Superior migration of both humeral heads abutting the acromion on each side with bony remodeling of the  acromion, compatible with large, chronic bilateral rotator cuff tears. IMPRESSION: No acute abnormality. Electronically Signed   By: Claudie Revering M.D.   On: 04/03/2018 15:19   US Abdomen Limited Ruq  Result Date: 04/03/2018 CLINICAL DATA:  Epigastric pain, nausea and vomiting EXAM: ULTRASOUND ABDOMEN LIMITED RIGHT UPPER QUADRANT COMPARISON:  None. FINDINGS: Gallbladder: Multiple shadowing stones measuring up to 2 cm. Positive sonographic Murphy. Wall thickness of 3 mm. Small amount of pericholecystic fluid. Common bile duct: Diameter: Dilated at 8 mm. Liver: No focal lesion  identified. Within normal limits in parenchymal echogenicity. Portal vein is patent on color Doppler imaging with normal direction of blood flow towards the liver. Possible small amount of fluid adjacent to the liver IMPRESSION: 1. Multiple gallstones with positive sonographic Murphy and small amount of pericholecystic fluid concerning for an acute cholecystitis. 2. Dilated common bile duct measuring up to 8 mm. If ductal stone or obstruction is suspected, further evaluation with MRCP could be obtained. Electronically Signed   By: Donavan Foil M.D.   On: 04/03/2018 16:46    Marzetta Board, MD, PhD [facility]  Contact via  [email or web address]  [place] P: [phone]  F: [phone]

## 2018-04-05 NOTE — Progress Notes (Signed)
[facility]:  [phone] General Surgery Progress Note   LOS: 2 days  POD -  1 Day Post-Op  Chief Complaint: Abdominal pain  Assessment and Plan: 1.  LAPAROSCOPIC CHOLECYSTECTOMY WITH INTRAOPERATIVE CHOLANGIOGRAM - 04/04/2018 - Dominique Ochoa  Zosyn  Labs better  Needs a total of 7 days of antibiotics because of possible chol  Okay to go home from surgery standpoint, but patient wants to stay another day.  2.  Bilateral lower abdominal wall hernias (inguinal)  These can be addressed in follow up in the office 3.  GERD 4.  CKD 5.  HTN x 1 year 6.  Asthma            Uses an occasional inhaler 7.  Thyroid replacement 8.  Foramen Bochdalek hernia 9.  DVT prophylaxis - will start chemoprophylaxis   Principal Problem:   Acute cholecystitis Active Problems:   HTN (hypertension)   Hypothyroidism   Asthma   GERD (gastroesophageal reflux disease)   Normocytic anemia   Sepsis (HCC)   Gallstone   Elevated troponin  Subjective:  Doing well.  Feels better.  But wants to stay one more day.  Daughter in room.  Objective:   Vitals:   04/04/18 2049 04/05/18 0544  BP: 127/77 135/80  Pulse: 99 85  Resp: (!) 22 18  Temp: 99.2 F (37.3 C) 98 F (36.7 C)  SpO2: 92% 96%     Intake/Output from previous day:  03/07 0701 - 03/08 0700 In: 2386.6 [I.V.:2255; IV Piggyback:131.7] Out: 10 [Blood:10]  Intake/Output this shift:  No intake/output data recorded.   Physical Exam:   General: WN older AA F who is alert and oriented.    HEENT: Normal. Pupils equal. .   Lungs: Clear   Abdomen: Soft.  BS present   Wound: Clean   Lab Results:    Recent Labs    04/04/18 0222 04/05/18 0432  WBC 27.8* 18.0*  HGB 8.8* 9.3*  HCT 29.1* 31.6*  PLT 234 219    BMET   Recent Labs    04/04/18 0222 04/05/18 0432  NA 139 139  K 3.4* 4.5  CL 113* 112*  CO2 21* 22  GLUCOSE 99 149*  BUN 18 20  CREATININE 1.10* 1.20*  CALCIUM 7.7* 8.2*    PT/INR   Recent Labs     04/03/18 2105  LABPROT 15.5*  INR 1.2    ABG  No results for input(s): PHART, HCO3 in the last 72 hours.  Invalid input(s): PCO2, PO2   Studies/Results:  Dg Cholangiogram Operative  Result Date: 04/04/2018 CLINICAL DATA:  Gallstones. EXAM: INTRAOPERATIVE CHOLANGIOGRAM TECHNIQUE: Cholangiographic images from the C-arm fluoroscopic device were submitted for interpretation post-operatively. Please see the procedural report for the amount of contrast and the fluoroscopy time utilized. COMPARISON:  Ultrasound 04/03/2018 FINDINGS: Contrast opacification the biliary system. Contrast refluxes into the intrahepatic bile ducts. Mild dilatation of the extrahepatic biliary system. Multiple filling defects are suggestive for gas bubbles. No evidence for an obstructive stone. Contrast drains into the duodenum. IMPRESSION: Mild dilatation of the biliary system without an obstructing lesion. Filling defects are most compatible with gas bubbles. Electronically Signed   By: Richarda Overlie M.D.   On: 04/04/2018 14:33   Dg Abdomen Acute W/chest  Result Date: 04/03/2018 CLINICAL DATA:  Nausea and vomiting and chills since 10 a.m. today. EXAM: DG ABDOMEN ACUTE W/ 1V CHEST COMPARISON:  Chest radiographs dated 04/20/2017. FINDINGS: The cardiac silhouette remains borderline enlarged. No significant change in a  small amount of linear scarring at the left lateral lung base. Minimal linear scarring and probable pleural thickening at the right lateral lung base. Otherwise, clear lungs. Normal bowel gas pattern without free peritoneal air. Bilateral pelvic phleboliths. Additional left mid abdominal probable phleboliths. No definite urinary tract calculi visualized. Mild thoracolumbar scoliosis and mild lumbar spine degenerative changes. Mild left glenohumeral degenerative spur formation. Superior migration of both humeral heads abutting the acromion on each side with bony remodeling of the acromion, compatible with large, chronic  bilateral rotator cuff tears. IMPRESSION: No acute abnormality. Electronically Signed   By: Beckie Salts M.D.   On: 04/03/2018 15:19   US Abdomen Limited Ruq  Result Date: 04/03/2018 CLINICAL DATA:  Epigastric pain, nausea and vomiting EXAM: ULTRASOUND ABDOMEN LIMITED RIGHT UPPER QUADRANT COMPARISON:  None. FINDINGS: Gallbladder: Multiple shadowing stones measuring up to 2 cm. Positive sonographic Murphy. Wall thickness of 3 mm. Small amount of pericholecystic fluid. Common bile duct: Diameter: Dilated at 8 mm. Liver: No focal lesion identified. Within normal limits in parenchymal echogenicity. Portal vein is patent on color Doppler imaging with normal direction of blood flow towards the liver. Possible small amount of fluid adjacent to the liver IMPRESSION: 1. Multiple gallstones with positive sonographic Murphy and small amount of pericholecystic fluid concerning for an acute cholecystitis. 2. Dilated common bile duct measuring up to 8 mm. If ductal stone or obstruction is suspected, further evaluation with MRCP could be obtained. Electronically Signed   By: Jasmine Pang M.D.   On: 04/03/2018 16:46     Anti-infectives:   Anti-infectives (From admission, onward)   Start     Dose/Rate Route Frequency Ordered Stop   04/03/18 2200  piperacillin-tazobactam (ZOSYN) IVPB 3.375 g     3.375 g 12.5 mL/hr over 240 Minutes Intravenous Every 8 hours 04/03/18 2045     04/03/18 1545  piperacillin-tazobactam (ZOSYN) IVPB 3.375 g     3.375 g 100 mL/hr over 30 Minutes Intravenous  Once 04/03/18 1541 04/03/18 1639   04/03/18 1515  piperacillin-tazobactam (ZOSYN) IVPB 2.25 g  Status:  Discontinued     2.25 g 100 mL/hr over 30 Minutes Intravenous  Once 04/03/18 1509 04/03/18 1541      Ovidio Kin, MD, FACS Pager: [phone] [facility]: [phone] 04/05/2018

## 2018-04-06 LAB — CBC
HCT: 30 % — ABNORMAL LOW (ref 36.0–46.0)
Hemoglobin: 8.8 g/dL — ABNORMAL LOW (ref 12.0–15.0)
MCH: 24.6 pg — AB (ref 26.0–34.0)
MCHC: 29.3 g/dL — ABNORMAL LOW (ref 30.0–36.0)
MCV: 84 fL (ref 80.0–100.0)
Platelets: 211 10*3/uL (ref 150–400)
RBC: 3.57 MIL/uL — ABNORMAL LOW (ref 3.87–5.11)
RDW: 16.9 % — ABNORMAL HIGH (ref 11.5–15.5)
WBC: 12.5 10*3/uL — ABNORMAL HIGH (ref 4.0–10.5)
nRBC: 0 % (ref 0.0–0.2)

## 2018-04-06 LAB — CULTURE, BLOOD (ROUTINE X 2): Special Requests: ADEQUATE

## 2018-04-06 MED ORDER — AMOXICILLIN-POT CLAVULANATE 125-31.25 MG/5ML PO SUSR: 875.0000 mg | Freq: Two times a day (BID) | ORAL | 0 refills | Status: AC

## 2018-04-06 MED ORDER — TRAMADOL HCL 50 MG PO TABS: 50.0000 mg | ORAL_TABLET | Freq: Four times a day (QID) | ORAL | 0 refills | Status: DC | PRN

## 2018-04-06 MED ORDER — ACETAMINOPHEN 325 MG PO TABS: 650.0000 mg | ORAL_TABLET | ORAL | 2 refills | Status: AC | PRN

## 2018-04-06 NOTE — Discharge Summary (Signed)
Physician Discharge Summary  Dominique Ochoa TML:[id number] DOB: July 07, 1940 DOA: 04/03/2018  PCP: Drake Leach, MD  Admit date: 04/03/2018 Discharge date: 04/06/2018  Admitted From: home Disposition:  home  Recommendations for Outpatient Follow-up:  1. Follow up with PCP in 1-2 weeks 2. Follow-up with general surgery as scheduled 3. Continue Augmentin until finished  Home Health: none Equipment/Devices: none  Discharge Condition: stable CODE STATUS: Full code Diet recommendation: regular  HPI: Per admitting MD, Dominique Ochoa is a 78 y.o. female with medical history significant of hypertension, asthma, GERD, hypothyroidism, who presents with nausea, vomiting, abdominal pain, fever and chills. Patient states that her symptoms started in the morning, including nausea, vomiting and abdominal pain, and fever and chills.  She vomited twice with nonbloody non-biliary vomitus.  No diarrhea.  Her abdominal pain is located in epigastric area, moderate, dull, nonradiating.  She also reports fever and chills.  Patient states that she does not have chest pain, shortness breath, cough.  Denies symptoms of UTI or unilateral weakness. RN reports that pt had occasional PVC earlier. ED Course: pt was found to have WBC 9.9, troponin 0.03, 0.05, lactic acid 2.4, abnormal liver function (ALP 288, AST 570, ALT 149, total bilirubin 1.2), urinalysis (hazy appearance, negative leukocyte, positive nitrite, WBC 0-5), electrolytes renal function okay, temperature 100.8, tachycardia, tachypnea, oxygen saturation 93% on room air.  X-ray of acute abdomen/chest is negative. Pt is admitted to telemetry bed as inpatient.  Dr. Lucia Gaskins of general surgery was consulted. US-RUQ: 1. Multiple gallstones with positive sonographic Murphy and small amount of pericholecystic fluid concerning for an acute cholecystitis. 2. Dilated common bile duct measuring up to 8 mm. If ductal stone or obstruction is suspected, further evaluation with MRCP  could be obtained.  Hospital Course: Principal Problem  Sepsis due to acute cholecystitis with ascending cholangitis-General surgery was consulted and patient was started on Zosyn.  Met criteria for sepsis with fever, tachycardia, tachypnea as well as leukocytosis.  She eventually was taken to operating room for lap chole on 3/7, she recovered well postop, her diet was advanced and she is able to tolerate a regular diet.  Her leukocytosis is also improved, she is afebrile, back to normal and will be discharged home in stable condition.  She will continue home Augmentin for total of 7 days antibiotics.  She will follow-up with general surgery as an outpatient.  Active Problems Hypertension -resume home medications Hypothyroidism -continue Synthroid Elevated troponin -Likely demand ischemia in the setting of sepsis.  Cardiology saw patient on admission.  2D echo is normal as below. Normocytic anemia -Hemoglobin stable Asthma -Stable, no wheezing, continue PRN's Bacteriuria -urinalysis speciated E. coli and Klebsiella however patient did not have any UTI type symptoms   Discharge Diagnoses:  Principal Problem:   Acute cholecystitis Active Problems:   HTN (hypertension)   Hypothyroidism   Asthma   GERD (gastroesophageal reflux disease)   Normocytic anemia   Sepsis ([place])   Gallstone   Elevated troponin     Discharge Instructions   Allergies as of 04/06/2018      Reactions   Fish Allergy Other (See Comments)   Makes her weak   Fish Oil Other (See Comments), Swelling   MD orders   Lisinopril Swelling   Facial swelling   Sulfamethoxazole-trimethoprim Itching, Swelling   Facial swelling      Medication List    TAKE these medications   acetaminophen 325 MG tablet Commonly known as:  Tylenol Take 2 tablets (650 mg total)  by mouth every 4 (four) hours as needed.   albuterol 108 (90 Base) MCG/ACT inhaler Commonly known as:  PROVENTIL HFA;VENTOLIN HFA Inhale 1 puff into the  lungs every 6 (six) hours as needed for wheezing or shortness of breath.   albuterol (5 MG/ML) 0.5% nebulizer solution Commonly known as:  PROVENTIL Take 0.5 mLs (2.5 mg total) by nebulization every 6 (six) hours as needed for wheezing or shortness of breath.   amLODipine 5 MG tablet Commonly known as:  NORVASC Take 5 mg by mouth daily.   amoxicillin-clavulanate 125-31.25 MG/5ML suspension Commonly known as:  AUGMENTIN Take 35 mLs (875 mg total) by mouth 2 (two) times daily for 6 days. Pharmacy, please dispense genetic tabs if liquid formulation is too expensive   cetirizine 10 MG tablet Commonly known as:  ZYRTEC Take 10 mg by mouth daily.   levothyroxine 25 MCG tablet Commonly known as:  SYNTHROID, LEVOTHROID Take 25 mcg by mouth daily before breakfast.   multivitamin with minerals Tabs tablet Take 1 tablet by mouth daily.   traMADol 50 MG tablet Commonly known as:  ULTRAM Take 1 tablet (50 mg total) by mouth every 6 (six) hours as needed for moderate pain.   Vitamin D (Ergocalciferol) 1.25 MG (50000 UT) Caps capsule Commonly known as:  DRISDOL Take 50,000 Units by mouth every 7 (seven) days. Tuesday of each week      Follow-up Information    [facility], [place] Follow up in 2 week(s).   Specialty:  General Surgery Why:  our office will call you to set up appointment date and time.  please arrive to appointment 30 minutes prior to scheduled time for paperwork and check in.  please bring photo ID and insurance card Contact information: [place] [address] [phone]           Consultations:    Procedures/Studies:  2D echo  IMPRESSIONS   1. The left ventricle has normal systolic function with an ejection fraction of 60-65%. The cavity size was normal. There is mildly increased left ventricular wall thickness. Left ventricular diastolic Doppler parameters are consistent with impaired  relaxation No evidence of left  ventricular regional wall motion abnormalities. 2. The right ventricle has normal systolic function. The cavity was mildly enlarged. There is mildly increased right ventricular wall thickness. 3. Left atrial size was mildly dilated. 4. Right atrial size was mildly dilated. 5. The mitral valve is degenerative. Mild thickening of the mitral valve leaflet. 6. The tricuspid valve is normal in structure. Tricuspid valve regurgitation is mild-moderate. 7. The aortic valve is tricuspid Mild sclerosis of the aortic valve. Aortic valve regurgitation is trivial by color flow Doppler. 8. The inferior vena cava was dilated in size with >50% respiratory variability. 9. Small patent foramen ovale with predominantly left to right shunting across the atrial septum. 10. Evidence of atrial level shunting detected by color flow Doppler. 11. No evidence of left ventricular regional wall motion abnormalities. 12. The interatrial septum is aneurysmal.  Dg Cholangiogram Operative  Result Date: 04/04/2018 CLINICAL DATA:  Gallstones. EXAM: INTRAOPERATIVE CHOLANGIOGRAM TECHNIQUE: Cholangiographic images from the C-arm fluoroscopic device were submitted for interpretation post-operatively. Please see the procedural report for the amount of contrast and the fluoroscopy time utilized. COMPARISON:  Ultrasound 04/03/2018 FINDINGS: Contrast opacification the biliary system. Contrast refluxes into the intrahepatic bile ducts. Mild dilatation of the extrahepatic biliary system. Multiple filling defects are suggestive for gas bubbles. No evidence for an obstructive stone. Contrast drains  into the duodenum. IMPRESSION: Mild dilatation of the biliary system without an obstructing lesion. Filling defects are most compatible with gas bubbles. Electronically Signed   By: Markus Daft M.D.   On: 04/04/2018 14:33   Dg Abdomen Acute W/chest  Result Date: 04/03/2018 CLINICAL DATA:  Nausea and vomiting and chills since 10 a.m. today.  EXAM: DG ABDOMEN ACUTE W/ 1V CHEST COMPARISON:  Chest radiographs dated 04/20/2017. FINDINGS: The cardiac silhouette remains borderline enlarged. No significant change in a small amount of linear scarring at the left lateral lung base. Minimal linear scarring and probable pleural thickening at the right lateral lung base. Otherwise, clear lungs. Normal bowel gas pattern without free peritoneal air. Bilateral pelvic phleboliths. Additional left mid abdominal probable phleboliths. No definite urinary tract calculi visualized. Mild thoracolumbar scoliosis and mild lumbar spine degenerative changes. Mild left glenohumeral degenerative spur formation. Superior migration of both humeral heads abutting the acromion on each side with bony remodeling of the acromion, compatible with large, chronic bilateral rotator cuff tears. IMPRESSION: No acute abnormality. Electronically Signed   By: Claudie Revering M.D.   On: 04/03/2018 15:19   US Abdomen Limited Ruq  Result Date: 04/03/2018 CLINICAL DATA:  Epigastric pain, nausea and vomiting EXAM: ULTRASOUND ABDOMEN LIMITED RIGHT UPPER QUADRANT COMPARISON:  None. FINDINGS: Gallbladder: Multiple shadowing stones measuring up to 2 cm. Positive sonographic Murphy. Wall thickness of 3 mm. Small amount of pericholecystic fluid. Common bile duct: Diameter: Dilated at 8 mm. Liver: No focal lesion identified. Within normal limits in parenchymal echogenicity. Portal vein is patent on color Doppler imaging with normal direction of blood flow towards the liver. Possible small amount of fluid adjacent to the liver IMPRESSION: 1. Multiple gallstones with positive sonographic Murphy and small amount of pericholecystic fluid concerning for an acute cholecystitis. 2. Dilated common bile duct measuring up to 8 mm. If ductal stone or obstruction is suspected, further evaluation with MRCP could be obtained. Electronically Signed   By: Donavan Foil M.D.   On: 04/03/2018 16:46      Subjective: - no  chest pain, shortness of breath, no abdominal pain, nausea or vomiting.   Discharge Exam: BP 138/88 (BP Location: Right Arm)   Pulse 94   Temp 98.4 F (36.9 C) (Oral)   Resp 16   Ht '5\' 5"'$  (1.651 m)   Wt 76.7 kg   SpO2 94%   BMI 28.14 kg/m   General: Pt is alert, awake, not in acute distress Cardiovascular: RRR, S1/S2 +, no rubs, no gallops Respiratory: CTA bilaterally, no wheezing, no rhonchi Abdominal: Soft, NT, ND, bowel sounds + Extremities: no edema, no cyanosis    The results of significant diagnostics from this hospitalization (including imaging, microbiology, ancillary and laboratory) are listed below for reference.     Microbiology: Recent Results (from the past 240 hour(s))  Culture, blood (routine x 2)     Status: Abnormal   Collection Time: 04/03/18  2:20 PM  Result Value Ref Range Status   Specimen Description   Final    BLOOD LEFT ANTECUBITAL Performed at [facility], [address] [place], [place] [address]    Special Requests   Final    BOTTLES DRAWN AEROBIC AND ANAEROBIC Blood Culture adequate volume Performed at [facility], [address] [place], [place] [address]    Culture  Setup Time   Final    IN BOTH AEROBIC AND ANAEROBIC BOTTLES GRAM NEGATIVE RODS CRITICAL RESULT CALLED TO, READ BACK  BY AND VERIFIED WITH: PHARMD A PHAN [id number] AT 2423 BY CM Performed at [facility], [place] [address], [place], [place] [address]    Culture ESCHERICHIA COLI (A)  Final   Report Status 04/06/2018 FINAL  Final   Organism ID, Bacteria ESCHERICHIA COLI  Final      Susceptibility   Escherichia coli - MIC*    AMPICILLIN 8 SENSITIVE Sensitive     CEFAZOLIN <=4 SENSITIVE Sensitive     CEFEPIME <=1 SENSITIVE Sensitive     CEFTAZIDIME <=1 SENSITIVE Sensitive     CEFTRIAXONE <=1 SENSITIVE Sensitive     CIPROFLOXACIN <=0.25 SENSITIVE Sensitive     GENTAMICIN <=1 SENSITIVE Sensitive     IMIPENEM <=0.25 SENSITIVE Sensitive      TRIMETH/SULFA <=20 SENSITIVE Sensitive     AMPICILLIN/SULBACTAM 4 SENSITIVE Sensitive     PIP/TAZO <=4 SENSITIVE Sensitive     Extended ESBL NEGATIVE Sensitive     * ESCHERICHIA COLI  Blood Culture ID Panel (Reflexed)     Status: Abnormal   Collection Time: 04/03/18  2:20 PM  Result Value Ref Range Status   Enterococcus species NOT DETECTED NOT DETECTED Final   Listeria monocytogenes NOT DETECTED NOT DETECTED Final   Staphylococcus species NOT DETECTED NOT DETECTED Final   Staphylococcus aureus (BCID) NOT DETECTED NOT DETECTED Final   Streptococcus species NOT DETECTED NOT DETECTED Final   Streptococcus agalactiae NOT DETECTED NOT DETECTED Final   Streptococcus pneumoniae NOT DETECTED NOT DETECTED Final   Streptococcus pyogenes NOT DETECTED NOT DETECTED Final   Acinetobacter baumannii NOT DETECTED NOT DETECTED Final   Enterobacteriaceae species DETECTED (A) NOT DETECTED Final    Comment: Enterobacteriaceae represent a large family of gram-negative bacteria, not a single organism. CRITICAL RESULT CALLED TO, READ BACK BY AND VERIFIED WITH: PHARMD A PHAN [id number] AT 1120 BY CM    Enterobacter cloacae complex NOT DETECTED NOT DETECTED Final   Escherichia coli DETECTED (A) NOT DETECTED Final    Comment: CRITICAL RESULT CALLED TO, READ BACK BY AND VERIFIED WITH: PHARMD A PHAN [id number] AT 1120 BY CM    Klebsiella oxytoca NOT DETECTED NOT DETECTED Final   Klebsiella pneumoniae NOT DETECTED NOT DETECTED Final   Proteus species NOT DETECTED NOT DETECTED Final   Serratia marcescens NOT DETECTED NOT DETECTED Final   Carbapenem resistance NOT DETECTED NOT DETECTED Final   Haemophilus influenzae NOT DETECTED NOT DETECTED Final   Neisseria meningitidis NOT DETECTED NOT DETECTED Final   Pseudomonas aeruginosa NOT DETECTED NOT DETECTED Final   Candida albicans NOT DETECTED NOT DETECTED Final   Candida glabrata NOT DETECTED NOT DETECTED Final   Candida krusei NOT DETECTED NOT DETECTED Final   Candida  parapsilosis NOT DETECTED NOT DETECTED Final   Candida tropicalis NOT DETECTED NOT DETECTED Final    Comment: Performed at [facility], [place] [address] [place], [place] [address]  Urine culture     Status: Abnormal   Collection Time: 04/03/18  3:13 PM  Result Value Ref Range Status   Specimen Description   Final    URINE, RANDOM Performed at [facility], [address] [place], [place] [address]    Special Requests   Final    NONE Performed at [facility], [address] [place], [place] [address]    Culture >=100,000 COLONIES/mL KLEBSIELLA PNEUMONIAE (A)  Final   Report Status 04/05/2018 FINAL  Final   Organism ID, Bacteria KLEBSIELLA PNEUMONIAE (A)  Final  Susceptibility   Klebsiella pneumoniae - MIC*    AMPICILLIN RESISTANT Resistant     CEFAZOLIN <=4 SENSITIVE Sensitive     CEFTRIAXONE <=1 SENSITIVE Sensitive     CIPROFLOXACIN <=0.25 SENSITIVE Sensitive     GENTAMICIN <=1 SENSITIVE Sensitive     IMIPENEM 1 SENSITIVE Sensitive     NITROFURANTOIN 64 INTERMEDIATE Intermediate     TRIMETH/SULFA <=20 SENSITIVE Sensitive     AMPICILLIN/SULBACTAM 4 SENSITIVE Sensitive     PIP/TAZO <=4 SENSITIVE Sensitive     Extended ESBL NEGATIVE Sensitive     * >=100,000 COLONIES/mL KLEBSIELLA PNEUMONIAE  Surgical pcr screen     Status: None   Collection Time: 04/04/18  7:17 AM  Result Value Ref Range Status   MRSA, PCR NEGATIVE NEGATIVE Final   Staphylococcus aureus NEGATIVE NEGATIVE Final    Comment: (NOTE) The Xpert SA Assay (FDA approved for NASAL specimens in patients 73 years of age and older), is one component of a comprehensive surveillance program. It is not intended to diagnose infection nor to guide or monitor treatment. Performed at [facility], [place] [address], [place], [place] [address]      Labs: BNP (last 3 results) No results for input(s): BNP in the last 8760 hours. Basic Metabolic Panel: Recent Labs  Lab  04/03/18 1429 04/04/18 0222 04/05/18 0432  NA 137 139 139  K 3.6 3.4* 4.5  CL 106 113* 112*  CO2 22 21* 22  GLUCOSE 150* 99 149*  BUN '18 18 20  '$ CREATININE 1.00 1.10* 1.20*  CALCIUM 8.8* 7.7* 8.2*   Liver Function Tests: Recent Labs  Lab 04/03/18 1429 04/04/18 0222 04/05/18 0432  AST 570* 358* 162*  ALT 149* 194* 138*  ALKPHOS 288* 201* 146*  BILITOT 1.2 2.9* 2.2*  PROT 7.1 5.7* 6.1*  ALBUMIN 3.6 2.8* 2.7*   Recent Labs  Lab 04/03/18 1429  LIPASE 57*   No results for input(s): AMMONIA in the last 168 hours. CBC: Recent Labs  Lab 04/03/18 1429 04/04/18 0222 04/05/18 0432 04/06/18 0422  WBC 9.9 27.8* 18.0* 12.5*  NEUTROABS 8.8*  --   --   --   HGB 10.9* 8.8* 9.3* 8.8*  HCT 36.3 29.1* 31.6* 30.0*  MCV 81.6 83.6 84.0 84.0  PLT 294 234 219 211   Cardiac Enzymes: Recent Labs  Lab 04/03/18 1429 04/03/18 2105 04/04/18 0222  TROPONINI 0.03* 0.05* 0.04*  0.04*   BNP: Invalid input(s): POCBNP CBG: No results for input(s): GLUCAP in the last 168 hours. D-Dimer No results for input(s): DDIMER in the last 72 hours. Hgb A1c Recent Labs    04/04/18 0222  HGBA1C 5.8*   Lipid Profile Recent Labs    04/04/18 0222  CHOL 98  HDL 40*  LDLCALC 53  TRIG 27  CHOLHDL 2.5   Thyroid function studies No results for input(s): TSH, T4TOTAL, T3FREE, THYROIDAB in the last 72 hours.  Invalid input(s): FREET3 Anemia work up No results for input(s): VITAMINB12, FOLATE, FERRITIN, TIBC, IRON, RETICCTPCT in the last 72 hours. Urinalysis    Component Value Date/Time   COLORURINE YELLOW 04/03/2018 1513   APPEARANCEUR HAZY (A) 04/03/2018 1513   LABSPEC 1.015 04/03/2018 1513   PHURINE 6.0 04/03/2018 1513   GLUCOSEU NEGATIVE 04/03/2018 1513   HGBUR NEGATIVE 04/03/2018 1513   BILIRUBINUR NEGATIVE 04/03/2018 1513   KETONESUR NEGATIVE 04/03/2018 1513   PROTEINUR NEGATIVE 04/03/2018 1513   UROBILINOGEN 1.0 12/03/2013 2108   NITRITE POSITIVE (A) 04/03/2018 1513    LEUKOCYTESUR NEGATIVE  04/03/2018 1513   Sepsis Labs Invalid input(s): PROCALCITONIN,  WBC,  LACTICIDVEN  FURTHER DISCHARGE INSTRUCTIONS:   Get Medicines reviewed and adjusted: Please take all your medications with you for your next visit with your Primary MD   Laboratory/radiological data: Please request your Primary MD to go over all hospital tests and procedure/radiological results at the follow up, please ask your Primary MD to get all Hospital records sent to his/her office.   In some cases, they will be blood work, cultures and biopsy results pending at the time of your discharge. Please request that your primary care M.D. goes through all the records of your hospital data and follows up on these results.   Also Note the following: If you experience worsening of your admission symptoms, develop shortness of breath, life threatening emergency, suicidal or homicidal thoughts you must seek medical attention immediately by calling 911 or calling your MD immediately  if symptoms less severe.   You must read complete instructions/literature along with all the possible adverse reactions/side effects for all the Medicines you take and that have been prescribed to you. Take any new Medicines after you have completely understood and accpet all the possible adverse reactions/side effects.    Do not drive when taking Pain medications or sleeping medications (Benzodaizepines)   Do not take more than prescribed Pain, Sleep and Anxiety Medications. It is not advisable to combine anxiety,sleep and pain medications without talking with your primary care practitioner   Special Instructions: If you have smoked or chewed Tobacco  in the last 2 yrs please stop smoking, stop any regular Alcohol  and or any Recreational drug use.   Wear Seat belts while driving.   Please note: You were cared for by a hospitalist during your hospital stay. Once you are discharged, your primary care physician will handle  any further medical issues. Please note that NO REFILLS for any discharge medications will be authorized once you are discharged, as it is imperative that you return to your primary care physician (or establish a relationship with a primary care physician if you do not have one) for your post hospital discharge needs so that they can reassess your need for medications and monitor your lab values.  Time coordinating discharge: 25 minutes  SIGNED:  Marzetta Board, PA-S 04/06/2018, 12:59 PM

## 2018-04-06 NOTE — Discharge Instructions (Signed)
[facility] ° °Please arrive at least 30 min before your appointment to complete your check in paperwork.  If you are unable to arrive 30 min prior to your appointment time we may have to cancel or reschedule you. °LAPAROSCOPIC SURGERY: POST OP INSTRUCTIONS °Always review your discharge instruction sheet given to you by the facility where your surgery was performed. °IF YOU HAVE DISABILITY OR FAMILY LEAVE FORMS, YOU MUST BRING THEM TO THE OFFICE FOR PROCESSING.   °DO NOT GIVE THEM TO YOUR DOCTOR. ° °PAIN CONTROL ° °1. First take acetaminophen (Tylenol) AND/or ibuprofen (Advil) to control your pain after surgery.  Follow directions on package.  Taking acetaminophen (Tylenol) and/or ibuprofen (Advil) regularly after surgery will help to control your pain and lower the amount of prescription pain medication you may need.  You should not take more than 4,000 mg (4 grams) of acetaminophen (Tylenol) in 24 hours.  You should not take ibuprofen (Advil), aleve, motrin, naprosyn or other NSAIDS if you have a history of stomach ulcers or chronic kidney disease.  °2. A prescription for pain medication may be given to you upon discharge.  Take your pain medication as prescribed, if you still have uncontrolled pain after taking acetaminophen (Tylenol) or ibuprofen (Advil). °3. Use ice packs to help control pain. °4. If you need a refill on your pain medication, please contact your pharmacy.  They will contact our office to request authorization. Prescriptions will not be filled after 5pm or on week-ends. ° °HOME MEDICATIONS °5. Take your usually prescribed medications unless otherwise directed. ° °DIET °6. You should follow a light diet the first few days after arrival home.  Be sure to include lots of fluids daily. Avoid fatty, fried foods.  ° °CONSTIPATION °7. It is common to experience some constipation after surgery and if you are taking pain medication.  Increasing fluid intake and taking a stool  softener (such as Colace) will usually help or prevent this problem from occurring.  A mild laxative (Milk of Magnesia or Miralax) should be taken according to package instructions if there are no bowel movements after 48 hours. ° °WOUND/INCISION CARE °8. Most patients will experience some swelling and bruising in the area of the incisions.  Ice packs will help.  Swelling and bruising can take several days to resolve.  °9. Unless discharge instructions indicate otherwise, follow guidelines below  °a. STERI-STRIPS - you may remove your outer bandages 48 hours after surgery, and you may shower at that time.  You have steri-strips (small skin tapes) in place directly over the incision.  These strips should be left on the skin for 7-10 days.   °b. DERMABOND/SKIN GLUE - you may shower in 24 hours.  The glue will flake off over the next 2-3 weeks. °10. Any sutures or staples will be removed at the office during your follow-up visit. ° °ACTIVITIES °11. You may resume regular (light) daily activities beginning the next day--such as daily self-care, walking, climbing stairs--gradually increasing activities as tolerated.  You may have sexual intercourse when it is comfortable.  Refrain from any heavy lifting or straining until approved by your doctor. °a. You may drive when you are no longer taking prescription pain medication, you can comfortably wear a seatbelt, and you can safely maneuver your car and apply brakes. ° °FOLLOW-UP °12. You should see your doctor in the office for a follow-up appointment approximately 2-3 weeks after your surgery.  You should have been given your post-op/follow-up appointment when   your surgery was scheduled.  If you did not receive a post-op/follow-up appointment, make sure that you call for this appointment within a day or two after you arrive home to insure a convenient appointment time.   WHEN TO CALL YOUR DOCTOR: 1. Fever over 101.0 2. Inability to urinate 3. Continued bleeding from  incision. 4. Increased pain, redness, or drainage from the incision. 5. Increasing abdominal pain  The clinic staff is available to answer your questions during regular business hours.  Please dont hesitate to call and ask to speak to one of the nurses for clinical concerns.  If you have a medical emergency, go to the nearest emergency room or call 911.  A surgeon from [facility] is always on call at the hospital. [address], [address], [place], [place]  [address] ? [address] [address], [place], [place]   [address] [phone] ? [phone] ? FAX [phone]    [facility] - DISCHARGE INSTRUCTIONS TO PATIENT  Activity:  Lifting - No lifting more than 15 pounds for 7 days, then no limit  Wound Care:   May shower  Diet:  As tolerated  Follow up appointment:  Call Dr. Allene Pyo office [facility]) at [phone] for an appointment in 2 to 3 weeks.  Medications and dosages:  Resume your home medications.  Call Dr. Ezzard Standing or his office  [phone]) if you have:  Temperature greater than 100.4,  Persistent nausea and vomiting,  Redness, tenderness, or signs of infection (pain, swelling, redness, odor or green/yellow discharge around the site),  Any other questions or concerns you may have after discharge.  In an emergency, call 911 or go to an Emergency Department at a nearby hospital.

## 2018-04-06 NOTE — Progress Notes (Signed)
Patient ID: Dominique Ochoa, female   DOB: [birth year], 78 y.o.   MRN: [id number]    2 Days Post-Op  Subjective: No complaints.  Tolerating diet.  Ambulating well.  Dressed for discharge.  Objective: Vital signs in last 24 hours: Temp:  [97.8 F (36.6 C)-98.4 F (36.9 C)] 98.4 F (36.9 C) (03/09 0428) Pulse Rate:  [81-94] 94 (03/09 0428) Resp:  [16] 16 (03/08 2124) BP: (121-139)/(77-88) 138/88 (03/09 0428) SpO2:  [94 %-98 %] 94 % (03/09 0749) Last BM Date: 04/04/18  Intake/Output from previous day: 03/08 0701 - 03/09 0700 In: 1611.8 [I.V.:1461.8; IV Piggyback:150] Out: -  Intake/Output this shift: No intake/output data recorded.  PE: Abd: soft, appropriately tender, +BS, ND, incisions c/d/i  Lab Results:  Recent Labs    04/05/18 0432 04/06/18 0422  WBC 18.0* 12.5*  HGB 9.3* 8.8*  HCT 31.6* 30.0*  PLT 219 211   BMET Recent Labs    04/04/18 0222 04/05/18 0432  NA 139 139  K 3.4* 4.5  CL 113* 112*  CO2 21* 22  GLUCOSE 99 149*  BUN 18 20  CREATININE 1.10* 1.20*  CALCIUM 7.7* 8.2*   PT/INR Recent Labs    04/03/18 2105  LABPROT 15.5*  INR 1.2   CMP     Component Value Date/Time   NA 139 04/05/2018 0432   K 4.5 04/05/2018 0432   CL 112 (H) 04/05/2018 0432   CO2 22 04/05/2018 0432   GLUCOSE 149 (H) 04/05/2018 0432   BUN 20 04/05/2018 0432   CREATININE 1.20 (H) 04/05/2018 0432   CALCIUM 8.2 (L) 04/05/2018 0432   PROT 6.1 (L) 04/05/2018 0432   ALBUMIN 2.7 (L) 04/05/2018 0432   AST 162 (H) 04/05/2018 0432   ALT 138 (H) 04/05/2018 0432   ALKPHOS 146 (H) 04/05/2018 0432   BILITOT 2.2 (H) 04/05/2018 0432   GFRNONAA 43 (L) 04/05/2018 0432   GFRAA 50 (L) 04/05/2018 0432   Lipase     Component Value Date/Time   LIPASE 57 (H) 04/03/2018 1429       Studies/Results: Dg Cholangiogram Operative  Result Date: 04/04/2018 CLINICAL DATA:  Gallstones. EXAM: INTRAOPERATIVE CHOLANGIOGRAM TECHNIQUE: Cholangiographic images from the C-arm fluoroscopic device  were submitted for interpretation post-operatively. Please see the procedural report for the amount of contrast and the fluoroscopy time utilized. COMPARISON:  Ultrasound 04/03/2018 FINDINGS: Contrast opacification the biliary system. Contrast refluxes into the intrahepatic bile ducts. Mild dilatation of the extrahepatic biliary system. Multiple filling defects are suggestive for gas bubbles. No evidence for an obstructive stone. Contrast drains into the duodenum. IMPRESSION: Mild dilatation of the biliary system without an obstructing lesion. Filling defects are most compatible with gas bubbles. Electronically Signed   By: Richarda Overlie M.D.   On: 04/04/2018 14:33    Anti-infectives: Anti-infectives (From admission, onward)   Start     Dose/Rate Route Frequency Ordered Stop   04/06/18 0000  amoxicillin-clavulanate (AUGMENTIN) 125-31.25 MG/5ML suspension     875 mg Oral 2 times daily 04/06/18 0731 04/12/18 2359   04/03/18 2200  piperacillin-tazobactam (ZOSYN) IVPB 3.375 g     3.375 g 12.5 mL/hr over 240 Minutes Intravenous Every 8 hours 04/03/18 2045     04/03/18 1545  piperacillin-tazobactam (ZOSYN) IVPB 3.375 g     3.375 g 100 mL/hr over 30 Minutes Intravenous  Once 04/03/18 1541 04/03/18 1639   04/03/18 1515  piperacillin-tazobactam (ZOSYN) IVPB 2.25 g  Status:  Discontinued     2.25 g 100 mL/hr over 30 Minutes  Intravenous  Once 04/03/18 1509 04/03/18 1541       Assessment/Plan POD 2, s/p lap chole for acute cholecystitis, Dr. Ezzard Standing  -ok for DC home -follow up arranged and tramadol sent to pharmacy -she will need 4 more days of augment at home -discharge instructions discussed with patient and family member at bedside.  FEN - low fat diet VTE - being discharged ID - augmentin   LOS: 3 days    Letha Cape , [facility] 04/06/2018, 11:43 AM Pager: [phone]

## 2018-04-06 NOTE — Progress Notes (Signed)
Patient's IV removed.  Site WNL.  AVS reviewed with patient.  Patient's daughter present.  Verbalized understanding of discharge instructions, physician follow-up, medications.  Patient transported by RN via wheelchair to main entrance at discharge.  Patient reports belongings intact and in possession at time of discharge.  Patient stable at time of discharge.

## 2018-04-06 NOTE — Care Management Important Message (Signed)
Important Message  Patient Details  Name: Dominique Ochoa MRN: [id number] Date of Birth: [birth year]   Medicare Important Message Given:  Yes    Octavio Matheney 04/06/2018, 8:54 AM

## 2019-04-16 ENCOUNTER — Inpatient Hospital Stay (HOSPITAL_BASED_OUTPATIENT_CLINIC_OR_DEPARTMENT_OTHER)
Admission: EM | Admit: 2019-04-16 | Discharge: 2019-04-19 | DRG: 389 | Disposition: A | Discharge status: Home / Self Care | Payer: Medicare Other | Attending: General Surgery | Admitting: General Surgery

## 2019-04-16 ENCOUNTER — Other Ambulatory Visit: Payer: Self-pay

## 2019-04-16 ENCOUNTER — Encounter (HOSPITAL_BASED_OUTPATIENT_CLINIC_OR_DEPARTMENT_OTHER): Payer: Self-pay | Admitting: *Deleted

## 2019-04-16 ENCOUNTER — Inpatient Hospital Stay (HOSPITAL_BASED_OUTPATIENT_CLINIC_OR_DEPARTMENT_OTHER): Payer: Medicare Other

## 2019-04-16 ENCOUNTER — Emergency Department (HOSPITAL_BASED_OUTPATIENT_CLINIC_OR_DEPARTMENT_OTHER): Payer: Medicare Other

## 2019-04-16 DIAGNOSIS — J45909 Unspecified asthma, uncomplicated: Secondary | ICD-10-CM | POA: Diagnosis present

## 2019-04-16 DIAGNOSIS — Z978 Presence of other specified devices: Secondary | ICD-10-CM

## 2019-04-16 DIAGNOSIS — E039 Hypothyroidism, unspecified: Secondary | ICD-10-CM | POA: Diagnosis present

## 2019-04-16 DIAGNOSIS — K219 Gastro-esophageal reflux disease without esophagitis: Secondary | ICD-10-CM | POA: Diagnosis present

## 2019-04-16 DIAGNOSIS — Z8249 Family history of ischemic heart disease and other diseases of the circulatory system: Secondary | ICD-10-CM

## 2019-04-16 DIAGNOSIS — I129 Hypertensive chronic kidney disease with stage 1 through stage 4 chronic kidney disease, or unspecified chronic kidney disease: Secondary | ICD-10-CM | POA: Diagnosis present

## 2019-04-16 DIAGNOSIS — K436 Other and unspecified ventral hernia with obstruction, without gangrene: Secondary | ICD-10-CM | POA: Diagnosis present

## 2019-04-16 DIAGNOSIS — K56609 Unspecified intestinal obstruction, unspecified as to partial versus complete obstruction: Secondary | ICD-10-CM | POA: Diagnosis present

## 2019-04-16 DIAGNOSIS — K5669 Other partial intestinal obstruction: Principal | ICD-10-CM | POA: Diagnosis present

## 2019-04-16 DIAGNOSIS — Z79899 Other long term (current) drug therapy: Secondary | ICD-10-CM | POA: Diagnosis not present

## 2019-04-16 DIAGNOSIS — Z79890 Hormone replacement therapy (postmenopausal): Secondary | ICD-10-CM | POA: Diagnosis not present

## 2019-04-16 DIAGNOSIS — K4020 Bilateral inguinal hernia, without obstruction or gangrene, not specified as recurrent: Secondary | ICD-10-CM | POA: Diagnosis present

## 2019-04-16 DIAGNOSIS — Z20822 Contact with and (suspected) exposure to covid-19: Secondary | ICD-10-CM | POA: Diagnosis present

## 2019-04-16 DIAGNOSIS — Z91013 Allergy to seafood: Secondary | ICD-10-CM

## 2019-04-16 DIAGNOSIS — N182 Chronic kidney disease, stage 2 (mild): Secondary | ICD-10-CM | POA: Diagnosis present

## 2019-04-16 DIAGNOSIS — Z882 Allergy status to sulfonamides status: Secondary | ICD-10-CM

## 2019-04-16 DIAGNOSIS — Z888 Allergy status to other drugs, medicaments and biological substances status: Secondary | ICD-10-CM

## 2019-04-16 HISTORY — DX: Unspecified intestinal obstruction, unspecified as to partial versus complete obstruction: K56.609

## 2019-04-16 HISTORY — DX: Other and unspecified ventral hernia with obstruction, without gangrene: K43.6

## 2019-04-16 LAB — COMPREHENSIVE METABOLIC PANEL
ALT: 17 U/L (ref 0–44)
AST: 26 U/L (ref 15–41)
Albumin: 4 g/dL (ref 3.5–5.0)
Alkaline Phosphatase: 79 U/L (ref 38–126)
Anion gap: 11 (ref 5–15)
BUN: 13 mg/dL (ref 8–23)
CO2: 25 mmol/L (ref 22–32)
Calcium: 9.5 mg/dL (ref 8.9–10.3)
Chloride: 103 mmol/L (ref 98–111)
Creatinine, Ser: 0.87 mg/dL (ref 0.44–1.00)
GFR calc Af Amer: >60 mL/min (ref >60)
GFR calc non Af Amer: >60 mL/min (ref >60)
Glucose, Bld: 108 mg/dL — ABNORMAL HIGH (ref 70–99)
Potassium: 3.4 mmol/L — ABNORMAL LOW (ref 3.5–5.1)
Sodium: 139 mmol/L (ref 135–145)
Total Bilirubin: 0.8 mg/dL (ref 0.3–1.2)
Total Protein: 8.7 g/dL — ABNORMAL HIGH (ref 6.5–8.1)

## 2019-04-16 LAB — RESPIRATORY PANEL BY RT PCR (FLU A&B, COVID)
Influenza A by PCR: NEGATIVE
Influenza B by PCR: NEGATIVE
SARS Coronavirus 2 by RT PCR: NEGATIVE

## 2019-04-16 LAB — CBC WITH DIFFERENTIAL/PLATELET
Abs Immature Granulocytes: 0.03 10*3/uL (ref 0.00–0.07)
Basophils Absolute: 0.1 10*3/uL (ref 0.0–0.1)
Basophils Relative: 1 %
Eosinophils Absolute: 0.1 10*3/uL (ref 0.0–0.5)
Eosinophils Relative: 1 %
HCT: 41.5 % (ref 36.0–46.0)
Hemoglobin: 13.4 g/dL (ref 12.0–15.0)
Immature Granulocytes: 0 %
Lymphocytes Relative: 20 %
Lymphs Abs: 1.6 10*3/uL (ref 0.7–4.0)
MCH: 26.4 pg (ref 26.0–34.0)
MCHC: 32.3 g/dL (ref 30.0–36.0)
MCV: 81.7 fL (ref 80.0–100.0)
Monocytes Absolute: 0.8 10*3/uL (ref 0.1–1.0)
Monocytes Relative: 10 %
Neutro Abs: 5.2 10*3/uL (ref 1.7–7.7)
Neutrophils Relative %: 68 %
Platelets: 276 10*3/uL (ref 150–400)
RBC: 5.08 MIL/uL (ref 3.87–5.11)
RDW: 15.6 % — ABNORMAL HIGH (ref 11.5–15.5)
WBC: 7.8 10*3/uL (ref 4.0–10.5)
nRBC: 0 % (ref 0.0–0.2)

## 2019-04-16 LAB — URINALYSIS, ROUTINE W REFLEX MICROSCOPIC
Bilirubin Urine: NEGATIVE
Glucose, UA: NEGATIVE mg/dL
Hgb urine dipstick: NEGATIVE
Ketones, ur: NEGATIVE mg/dL
Nitrite: NEGATIVE
Protein, ur: NEGATIVE mg/dL
Specific Gravity, Urine: 1.015 (ref 1.005–1.030)
pH: 6.5 (ref 5.0–8.0)

## 2019-04-16 LAB — URINALYSIS, MICROSCOPIC (REFLEX): RBC / HPF: NONE SEEN RBC/hpf (ref 0–5)

## 2019-04-16 LAB — LIPASE, BLOOD: Lipase: 36 U/L (ref 11–51)

## 2019-04-16 MED ORDER — KCL IN DEXTROSE-NACL 20-5-0.45 MEQ/L-%-% IV SOLN
INTRAVENOUS | Status: DC
  Filled 2019-04-16 (×4): qty 1000

## 2019-04-16 MED ORDER — ONDANSETRON 4 MG PO TBDP: 4.0000 mg | ORAL_TABLET | Freq: Four times a day (QID) | ORAL | Status: DC | PRN

## 2019-04-16 MED ORDER — DIPHENHYDRAMINE HCL 12.5 MG/5ML PO ELIX: 12.5000 mg | ORAL_SOLUTION | Freq: Four times a day (QID) | ORAL | Status: DC | PRN

## 2019-04-16 MED ORDER — DIPHENHYDRAMINE HCL 50 MG/ML IJ SOLN: 12.5000 mg | Freq: Four times a day (QID) | INTRAMUSCULAR | Status: DC | PRN

## 2019-04-16 MED ORDER — ENOXAPARIN SODIUM 40 MG/0.4ML ~~LOC~~ SOLN
40.0000 mg | Freq: Every day | SUBCUTANEOUS | Status: DC
  Administered 2019-04-17 – 2019-04-19 (×3): 40 mg via SUBCUTANEOUS
  Filled 2019-04-16 (×3): qty 0.4

## 2019-04-16 MED ORDER — HYDROMORPHONE HCL 1 MG/ML IJ SOLN
0.5000 mg | INTRAMUSCULAR | Status: DC | PRN
  Administered 2019-04-17: 0.5 mg via INTRAVENOUS
  Filled 2019-04-16: qty 0.5

## 2019-04-16 MED ORDER — IOHEXOL 300 MG/ML  SOLN
100.0000 mL | Freq: Once | INTRAMUSCULAR | Status: AC | PRN
  Administered 2019-04-16: 100 mL via INTRAVENOUS

## 2019-04-16 MED ORDER — ONDANSETRON HCL 4 MG/2ML IJ SOLN: 4.0000 mg | Freq: Four times a day (QID) | INTRAMUSCULAR | Status: DC | PRN

## 2019-04-16 NOTE — ED Notes (Signed)
Family at bedside. 

## 2019-04-16 NOTE — ED Notes (Addendum)
Radiology was called after NG inserted and Pt. Was not hooked to low wall suction.  Waiting on results till results were back.  [facility] arrived before  RN aware of Results of Pt. Radiology results of the post NG placement so no low wall suction placed.

## 2019-04-16 NOTE — ED Notes (Signed)
ED Provider at bedside. 

## 2019-04-16 NOTE — ED Notes (Signed)
Report to [facility] when [facility] arrived

## 2019-04-16 NOTE — ED Triage Notes (Signed)
She feels constipated and has gas like pain.

## 2019-04-16 NOTE — ED Provider Notes (Signed)
[facility] EMERGENCY DEPARTMENT Provider Note   CSN: [id number] Arrival date & time: 04/16/19  1724     History Chief Complaint  Patient presents with  . Abdominal Pain    Dominique Ochoa is a 79 y.o. female.  HPI   Patient presents to the ED for evaluation of abdominal pain.  Patient states symptoms started today.  She has felt somewhat bloated and gassy.  She has also felt somewhat constipated.  She had a bowel movement today is not normal.  She took some laxatives without any significant relief.  She is having pain in the left lower quadrant.  She denies any nausea vomiting.  No fevers.  No dysuria.  Patient denies any history of persistent symptoms in the past.  Past Medical History:  Diagnosis Date  . Asthma   . GERD (gastroesophageal reflux disease)   . Hypertension   . Thyroid disease     Patient Active Problem List   Diagnosis Date Noted  . Bowel obstruction (HCC) 04/16/2019  . Acute cholecystitis 04/03/2018  . Sepsis (HCC) 04/03/2018  . Gallstone 04/03/2018  . Elevated troponin 04/03/2018  . Near syncope 04/20/2017  . Normocytic anemia 04/20/2017  . 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 Injury of right toe, sequela 04/20/2017  . Pain in the chest 09/16/2015  . HTN (hypertension) 09/16/2015  . Hypothyroidism 09/16/2015  . Asthma 09/16/2015  . GERD (gastroesophageal reflux disease) 09/16/2015  . Hyperglycemia 09/16/2015  . Abnormal finding on chest xray 09/16/2015    Past Surgical History:  Procedure Laterality Date  . CHOLECYSTECTOMY N/A 04/04/2018   Procedure: LAPAROSCOPIC CHOLECYSTECTOMY WITH INTRAOPERATIVE CHOLANGIOGRAM;  Surgeon: Ovidio Kin, MD;  Location: [facility];  Service: General;  Laterality: N/A;     OB History   No obstetric history on file.     Family History  Problem Relation Age of Onset  . Hypertension Son     Social History   Tobacco Use  . Smoking status: Never Smoker  . Smokeless tobacco: Never Used    Substance Use Topics  . Alcohol use: No  . Drug use: No    Home Medications Prior to Admission medications   Medication Sig Start Date End Date Taking? Authorizing Provider  albuterol (PROVENTIL HFA;VENTOLIN HFA) 108 (90 Base) MCG/ACT inhaler Inhale 1 puff into the lungs every 6 (six) hours as needed for wheezing or shortness of breath.   Yes [provider]  albuterol (PROVENTIL) (5 MG/ML) 0.5% nebulizer solution Take 0.5 mLs (2.5 mg total) by nebulization every 6 (six) hours as needed for wheezing or shortness of breath. 12/21/16  Yes Plunkett, Alphonzo Lemmings, MD  amLODipine (NORVASC) 5 MG tablet Take 5 mg by mouth daily.   Yes [provider]  cetirizine (ZYRTEC) 10 MG tablet Take 10 mg by mouth daily.   Yes [provider]  levothyroxine (SYNTHROID, LEVOTHROID) 25 MCG tablet Take 25 mcg by mouth daily before breakfast.   Yes [provider]  Multiple Vitamin (MULTIVITAMIN WITH MINERALS) TABS tablet Take 1 tablet by mouth daily.   Yes [provider]  Vitamin D, Ergocalciferol, (DRISDOL) 50000 units CAPS capsule Take 50,000 Units by mouth every 7 (seven) days. Tuesday of each week   Yes [provider]  traMADol (ULTRAM) 50 MG tablet Take 1 tablet (50 mg total) by mouth every 6 (six) hours as needed for moderate pain. 04/06/18   Barnetta Chapel, PA-C    Allergies    Fish allergy, Fish oil, Lisinopril, and  Sulfamethoxazole-trimethoprim  Review of Systems   Review of Systems  All other systems reviewed and are negative.   Physical Exam Updated Vital Signs BP (!) 145/87 (BP Location: Right Arm)   Pulse 86   Temp 98.1 F (36.7 C) (Oral)   Resp 16   Ht 1.6 m (5\' 3" )   Wt 68.9 kg   SpO2 98%   BMI 26.93 kg/m   Physical Exam Vitals and nursing note reviewed.  Constitutional:      General: She is not in acute distress. HENT:     Head: Normocephalic and atraumatic.     Right Ear: External ear normal.     Left Ear: External ear  normal.  Eyes:     General: No scleral icterus.       Right eye: No discharge.        Left eye: No discharge.     Conjunctiva/sclera: Conjunctivae normal.  Neck:     Trachea: No tracheal deviation.  Cardiovascular:     Rate and Rhythm: Normal rate and regular rhythm.  Pulmonary:     Effort: Pulmonary effort is normal. No respiratory distress.     Breath sounds: Normal breath sounds. No stridor. No wheezing or rales.  Abdominal:     General: Bowel sounds are normal. There is no distension.     Palpations: Abdomen is soft.     Tenderness: There is abdominal tenderness in the left lower quadrant. There is guarding. There is no rebound.  Musculoskeletal:        General: No tenderness.     Cervical back: Neck supple.  Skin:    General: Skin is warm and dry.     Findings: No rash.  Neurological:     Mental Status: She is alert.     Cranial Nerves: No cranial nerve deficit (no facial droop, extraocular movements intact, no slurred speech).     Sensory: No sensory deficit.     Motor: No abnormal muscle tone or seizure activity.     Coordination: Coordination normal.     ED Results / Procedures / Treatments   Labs (all labs ordered are listed, but only abnormal results are displayed) Labs Reviewed  COMPREHENSIVE METABOLIC PANEL - Abnormal; Notable for the following components:      Result Value   Potassium 3.4 (*)    Glucose, Bld 108 (*)    Total Protein 8.7 (*)    All other components within normal limits  CBC WITH DIFFERENTIAL/PLATELET - Abnormal; Notable for the following components:   RDW 15.6 (*)    All other components within normal limits  URINALYSIS, ROUTINE W REFLEX MICROSCOPIC - Abnormal; Notable for the following components:   Leukocytes,Ua TRACE (*)    All other components within normal limits  URINALYSIS, MICROSCOPIC (REFLEX) - Abnormal; Notable for the following components:   Bacteria, UA RARE (*)    All other components within normal limits  RESPIRATORY PANEL  BY RT PCR (FLU A&B, COVID)  LIPASE, BLOOD    EKG None  Radiology CT ABDOMEN PELVIS W CONTRAST  Result Date: 04/16/2019 CLINICAL DATA:  Abdominal pain. EXAM: CT ABDOMEN AND PELVIS WITH CONTRAST TECHNIQUE: Multidetector CT imaging of the abdomen and pelvis was performed using the standard protocol following bolus administration of intravenous contrast. CONTRAST:  183mL OMNIPAQUE IOHEXOL 300 MG/ML  SOLN COMPARISON:  None. FINDINGS: Lower chest: No acute abnormality. Hepatobiliary: No focal liver abnormality is seen. Status post cholecystectomy. No biliary dilatation. Pancreas: 11 mm rounded low density is  seen in pancreatic body. Mild ductal dilatation is noted. No acute inflammation is noted. Spleen: Normal in size without focal abnormality. Adrenals/Urinary Tract: Adrenal glands appear normal. Small bilateral renal cysts are noted. No hydronephrosis or renal obstruction is noted. No renal or ureteral calculi are noted. Urinary bladder is unremarkable. Stomach/Bowel: The stomach appears normal. There appears to be proximal small bowel dilatation secondary to obstruction from small bowel loops entering large left lower quadrant ventral hernia. Another large right lower quadrant ventral hernia is noted which contains loops of large and small bowel, but does not result in obstruction on this side. Diverticulosis is noted throughout the colon. No inflammation is noted. The appendix is not visualized. Vascular/Lymphatic: Aortic atherosclerosis. No enlarged abdominal or pelvic lymph nodes. Reproductive: Uterus and bilateral adnexa are unremarkable. Other: No ascites is noted. Musculoskeletal: No acute or significant osseous findings. IMPRESSION: 1. Large left lower quadrant ventral hernia is noted which contains loops of large and small bowel, which results in proximal small bowel obstruction. 2. Another large right lower quadrant ventral hernia is noted which contains loops of large and small bowel, but does not  result in obstruction on this side. 3. 11 mm rounded low density is seen in pancreatic body with mild ductal dilatation. Follow-up MRI with and without gadolinium is recommended in 2 years to ensure stability. 4. Diverticulosis of the colon is noted without inflammation. Aortic Atherosclerosis (ICD10-I70.0). Electronically Signed   By: Lupita Raider M.D.   On: 04/16/2019 20:55    Procedures Procedures (including critical care time)  Medications Ordered in ED Medications  iohexol (OMNIPAQUE) 300 MG/ML solution 100 mL (100 mLs Intravenous Contrast Given 04/16/19 2032)    ED Course  I have reviewed the triage vital signs and the nursing notes.  Pertinent labs & imaging results that were available during my care of the patient were reviewed by me and considered in my medical decision making (see chart for details).  Clinical Course as of Apr 16 2143  Fri Apr 16, 2019  2019 Labs reviewed.  No significant abnormality.  Urinalysis does not suggest UTI   [JK]  2130 CT scan findings reviewed.  Patient has a ventral hernia in the left lower quadrant with resulting small bowel obstruction   [JK]  2131 Manual reduction of the left lower quadrant ventral hernia attempted.  Unable to reduce   [JK]  2144 Case discussed with Dr. Maisie Fus.  Plan on admission   [JK]    Clinical Course User Index [JK] Linwood Dibbles, MD   MDM Rules/Calculators/A&P                      Patient presented to ED with complaints of abdominal pain, bloating and decreased bowel movements.  Patient had fullness in tenderness in the left lower quadrant.  CT scan confirms actually a very large ventral hernia with small and large bowel loops.  NG tube ordered.  Case discussed with Dr. Maisie Fus.  Plan admission to [facility] for further treatment. Final Clinical Impression(s) / ED Diagnoses Final diagnoses:  Other partial intestinal obstruction (HCC)  Irreducible ventral hernia      Linwood Dibbles, MD 04/16/19 2145

## 2019-04-17 ENCOUNTER — Inpatient Hospital Stay (HOSPITAL_COMMUNITY): Payer: Medicare Other

## 2019-04-17 LAB — BASIC METABOLIC PANEL
Anion gap: 9 (ref 5–15)
BUN: 11 mg/dL (ref 8–23)
CO2: 26 mmol/L (ref 22–32)
Calcium: 9.1 mg/dL (ref 8.9–10.3)
Chloride: 106 mmol/L (ref 98–111)
Creatinine, Ser: 0.75 mg/dL (ref 0.44–1.00)
GFR calc Af Amer: >60 mL/min (ref >60)
GFR calc non Af Amer: >60 mL/min (ref >60)
Glucose, Bld: 126 mg/dL — ABNORMAL HIGH (ref 70–99)
Potassium: 3.6 mmol/L (ref 3.5–5.1)
Sodium: 141 mmol/L (ref 135–145)

## 2019-04-17 LAB — CBC
HCT: 38.1 % (ref 36.0–46.0)
Hemoglobin: 11.8 g/dL — ABNORMAL LOW (ref 12.0–15.0)
MCH: 25.4 pg — ABNORMAL LOW (ref 26.0–34.0)
MCHC: 31 g/dL (ref 30.0–36.0)
MCV: 81.9 fL (ref 80.0–100.0)
Platelets: 272 10*3/uL (ref 150–400)
RBC: 4.65 MIL/uL (ref 3.87–5.11)
RDW: 15.7 % — ABNORMAL HIGH (ref 11.5–15.5)
WBC: 7.3 10*3/uL (ref 4.0–10.5)
nRBC: 0 % (ref 0.0–0.2)

## 2019-04-17 MED ORDER — PHENOL 1.4 % MT LIQD
1.0000 | OROMUCOSAL | Status: DC | PRN
  Filled 2019-04-17: qty 177

## 2019-04-17 MED ORDER — DIATRIZOATE MEGLUMINE & SODIUM 66-10 % PO SOLN
90.0000 mL | Freq: Once | ORAL | Status: AC
  Administered 2019-04-17: 90 mL via NASOGASTRIC
  Filled 2019-04-17: qty 90

## 2019-04-17 MED ORDER — AMLODIPINE BESYLATE 5 MG PO TABS
5.0000 mg | ORAL_TABLET | Freq: Every day | ORAL | Status: DC
  Administered 2019-04-17 – 2019-04-19 (×3): 5 mg via ORAL
  Filled 2019-04-17 (×3): qty 1

## 2019-04-17 MED ORDER — ALBUTEROL SULFATE HFA 108 (90 BASE) MCG/ACT IN AERS: 1.0000 | INHALATION_SPRAY | Freq: Four times a day (QID) | RESPIRATORY_TRACT | Status: DC | PRN

## 2019-04-17 MED ORDER — LEVOTHYROXINE SODIUM 25 MCG PO TABS
25.0000 ug | ORAL_TABLET | Freq: Every day | ORAL | Status: DC
  Administered 2019-04-17 – 2019-04-19 (×3): 25 ug via ORAL
  Filled 2019-04-17 (×3): qty 1

## 2019-04-17 MED ORDER — ALBUTEROL SULFATE (2.5 MG/3ML) 0.083% IN NEBU: 2.5000 mg | INHALATION_SOLUTION | Freq: Four times a day (QID) | RESPIRATORY_TRACT | Status: DC | PRN

## 2019-04-17 MED ORDER — MONTELUKAST SODIUM 10 MG PO TABS
10.0000 mg | ORAL_TABLET | Freq: Every day | ORAL | Status: DC
  Administered 2019-04-17 – 2019-04-19 (×3): 10 mg via ORAL
  Filled 2019-04-17 (×3): qty 1

## 2019-04-17 NOTE — H&P (Signed)
CC: abd pain   HPI: Dominique Ochoa is an 79 y.o. female who presented to [facility] with bloating and abd pain which has slowly been getting worse for the past 2 days.  She denies any nausea.  She tried laxatives with no relief.  Pt has a h/o of known hernias that have been asymptomatic.  H/o lap chole by Dr Ezzard Standing  Past Medical History:  Diagnosis Date  . Asthma   . GERD (gastroesophageal reflux disease)   . Hypertension   . Thyroid disease     Past Surgical History:  Procedure Laterality Date  . CHOLECYSTECTOMY N/A 04/04/2018   Procedure: LAPAROSCOPIC CHOLECYSTECTOMY WITH INTRAOPERATIVE CHOLANGIOGRAM;  Surgeon: Ovidio Kin, MD;  Location: [facility];  Service: General;  Laterality: N/A;    Family History  Problem Relation Age of Onset  . Hypertension Son     Social:  reports that she has never smoked. She has never used smokeless tobacco. She reports that she does not drink alcohol or use drugs.  Allergies:  Allergies  Allergen Reactions  . Fish Allergy Other (See Comments)    Makes her weak  . Fish Oil Other (See Comments) and Swelling    MD orders  . Lisinopril Swelling    Facial swelling  . Sulfamethoxazole-Trimethoprim Itching and Swelling    Facial swelling    Medications: I have reviewed the patient's current medications.  Results for orders placed or performed during the hospital encounter of 04/16/19 (from the past 48 hour(s))  Comprehensive metabolic panel     Status: Abnormal   Collection Time: 04/16/19  7:07 PM  Result Value Ref Range   Sodium 139 135 - 145 mmol/L   Potassium 3.4 (L) 3.5 - 5.1 mmol/L   Chloride 103 98 - 111 mmol/L   CO2 25 22 - 32 mmol/L   Glucose, Bld 108 (H) 70 - 99 mg/dL    Comment: Glucose reference range applies only to samples taken after fasting for at least 8 hours.   BUN 13 8 - 23 mg/dL   Creatinine, Ser 4.69 0.44 - 1.00 mg/dL   Calcium 9.5 8.9 - 62.9 mg/dL   Total Protein 8.7 (H) 6.5 - 8.1 g/dL   Albumin 4.0 3.5 - 5.0 g/dL   AST  26 15 - 41 U/L   ALT 17 0 - 44 U/L   Alkaline Phosphatase 79 38 - 126 U/L   Total Bilirubin 0.8 0.3 - 1.2 mg/dL   GFR calc non Af Amer >60 >60 mL/min   GFR calc Af Amer >60 >60 mL/min   Anion gap 11 5 - 15    Comment: Performed at [facility], [address] [place], [place] [address]  Lipase, blood     Status: None   Collection Time: 04/16/19  7:07 PM  Result Value Ref Range   Lipase 36 11 - 51 U/L    Comment: Performed at [facility], [address] [place], [place] [address]  CBC with Diff     Status: Abnormal   Collection Time: 04/16/19  7:07 PM  Result Value Ref Range   WBC 7.8 4.0 - 10.5 K/uL   RBC 5.08 3.87 - 5.11 MIL/uL   Hemoglobin 13.4 12.0 - 15.0 g/dL   HCT 10.2 72.5 - 36.6 %   MCV 81.7 80.0 - 100.0 fL   MCH 26.4 26.0 - 34.0 pg   MCHC 32.3 30.0 - 36.0 g/dL   RDW 44.0 (H) 34.7 - 42.5 %  Platelets 276 150 - 400 K/uL   nRBC 0.0 0.0 - 0.2 %   Neutrophils Relative % 68 %   Neutro Abs 5.2 1.7 - 7.7 K/uL   Lymphocytes Relative 20 %   Lymphs Abs 1.6 0.7 - 4.0 K/uL   Monocytes Relative 10 %   Monocytes Absolute 0.8 0.1 - 1.0 K/uL   Eosinophils Relative 1 %   Eosinophils Absolute 0.1 0.0 - 0.5 K/uL   Basophils Relative 1 %   Basophils Absolute 0.1 0.0 - 0.1 K/uL   Immature Granulocytes 0 %   Abs Immature Granulocytes 0.03 0.00 - 0.07 K/uL    Comment: Performed at [facility], [address] [place], [place] [address]  Urinalysis, Routine w reflex microscopic     Status: Abnormal   Collection Time: 04/16/19  7:07 PM  Result Value Ref Range   Color, Urine YELLOW YELLOW   APPearance CLEAR CLEAR   Specific Gravity, Urine 1.015 1.005 - 1.030   pH 6.5 5.0 - 8.0   Glucose, UA NEGATIVE NEGATIVE mg/dL   Hgb urine dipstick NEGATIVE NEGATIVE   Bilirubin Urine NEGATIVE NEGATIVE   Ketones, ur NEGATIVE NEGATIVE mg/dL   Protein, ur NEGATIVE NEGATIVE mg/dL   Nitrite NEGATIVE NEGATIVE   Leukocytes,Ua TRACE (A) NEGATIVE    Comment: Performed  at [facility], [address] [place], [place] [address]  Urinalysis, Microscopic (reflex)     Status: Abnormal   Collection Time: 04/16/19  7:07 PM  Result Value Ref Range   RBC / HPF NONE SEEN 0 - 5 RBC/hpf   WBC, UA 6-10 0 - 5 WBC/hpf   Bacteria, UA RARE (A) NONE SEEN   Squamous Epithelial / LPF 6-10 0 - 5    Comment: Performed at [facility], [address] [place], [place] [address]  Respiratory Panel by RT PCR (Flu A&B, Covid) - Nasopharyngeal Swab     Status: None   Collection Time: 04/16/19 10:03 PM   Specimen: Nasopharyngeal Swab  Result Value Ref Range   SARS Coronavirus 2 by RT PCR NEGATIVE NEGATIVE    Comment: (NOTE) SARS-CoV-2 target nucleic acids are NOT DETECTED. The SARS-CoV-2 RNA is generally detectable in upper respiratoy specimens during the acute phase of infection. The lowest concentration of SARS-CoV-2 viral copies this assay can detect is 131 copies/mL. A negative result does not preclude SARS-Cov-2 infection and should not be used as the sole basis for treatment or other patient management decisions. A negative result may occur with  improper specimen collection/handling, submission of specimen other than nasopharyngeal swab, presence of viral mutation(s) within the areas targeted by this assay, and inadequate number of viral copies (<131 copies/mL). A negative result must be combined with clinical observations, patient history, and epidemiological information. The expected result is Negative. Fact Sheet for Patients:  https://www.moore.com/ Fact Sheet for Healthcare Providers:  https://www.young.biz/ This test is not yet ap proved or cleared by the [place] FDA and  has been authorized for detection and/or diagnosis of SARS-CoV-2 by FDA under an Emergency Use Authorization (EUA). This EUA will remain  in effect (meaning this test can be used) for the duration of the COVID-19 declaration  under Section 564(b)(1) of the Act, 21 U.S.C. section 360bbb-3(b)(1), unless the authorization is terminated or revoked sooner.    Influenza A by PCR NEGATIVE NEGATIVE   Influenza B by PCR NEGATIVE NEGATIVE    Comment: (NOTE) The Xpert Xpress SARS-CoV-2/FLU/RSV assay is intended as  an aid in  the diagnosis of influenza from Nasopharyngeal swab specimens and  should not be used as a sole basis for treatment. Nasal washings and  aspirates are unacceptable for Xpert Xpress SARS-CoV-2/FLU/RSV  testing. Fact Sheet for Patients: https://www.moore.com/ Fact Sheet for Healthcare Providers: https://www.young.biz/ This test is not yet approved or cleared by the [place] FDA and  has been authorized for detection and/or diagnosis of SARS-CoV-2 by  FDA under an Emergency Use Authorization (EUA). This EUA will remain  in effect (meaning this test can be used) for the duration of the  Covid-19 declaration under Section 564(b)(1) of the Act, 21  U.S.C. section 360bbb-3(b)(1), unless the authorization is  terminated or revoked. Performed at [facility], [address] [place], [place] [address]   CBC     Status: Abnormal   Collection Time: 04/17/19  5:19 AM  Result Value Ref Range   WBC 7.3 4.0 - 10.5 K/uL   RBC 4.65 3.87 - 5.11 MIL/uL   Hemoglobin 11.8 (L) 12.0 - 15.0 g/dL   HCT 19.6 22.2 - 97.9 %   MCV 81.9 80.0 - 100.0 fL   MCH 25.4 (L) 26.0 - 34.0 pg   MCHC 31.0 30.0 - 36.0 g/dL   RDW 89.2 (H) 11.9 - 41.7 %   Platelets 272 150 - 400 K/uL   nRBC 0.0 0.0 - 0.2 %    Comment: Performed at [facility], [address] [place], [place] [address]    CT ABDOMEN PELVIS W CONTRAST  Result Date: 04/16/2019 CLINICAL DATA:  Abdominal pain. EXAM: CT ABDOMEN AND PELVIS WITH CONTRAST TECHNIQUE: Multidetector CT imaging of the abdomen and pelvis was performed using the standard protocol following bolus administration of  intravenous contrast. CONTRAST:  OMNIPAQUE IOHEXOL 300 MG/ML  SOLN COMPARISON:  None. FINDINGS: Lower chest: No acute abnormality. Hepatobiliary: No focal liver abnormality is seen. Status post cholecystectomy. No biliary dilatation. Pancreas: 11 mm rounded low density is seen in pancreatic body. Mild ductal dilatation is noted. No acute inflammation is noted. Spleen: Normal in size without focal abnormality. Adrenals/Urinary Tract: Adrenal glands appear normal. Small bilateral renal cysts are noted. No hydronephrosis or renal obstruction is noted. No renal or ureteral calculi are noted. Urinary bladder is unremarkable. Stomach/Bowel: The stomach appears normal. There appears to be proximal small bowel dilatation secondary to obstruction from small bowel loops entering large left lower quadrant ventral hernia. Another large right lower quadrant ventral hernia is noted which contains loops of large and small bowel, but does not result in obstruction on this side. Diverticulosis is noted throughout the colon. No inflammation is noted. The appendix is not visualized. Vascular/Lymphatic: Aortic atherosclerosis. No enlarged abdominal or pelvic lymph nodes. Reproductive: Uterus and bilateral adnexa are unremarkable. Other: No ascites is noted. Musculoskeletal: No acute or significant osseous findings. IMPRESSION: 1. Large left lower quadrant ventral hernia is noted which contains loops of large and small bowel, which results in proximal small bowel obstruction. 2. Another large right lower quadrant ventral hernia is noted which contains loops of large and small bowel, but does not result in obstruction on this side. 3. 11 mm rounded low density is seen in pancreatic body with mild ductal dilatation. Follow-up MRI with and without gadolinium is recommended in 2 years to ensure stability. 4. Diverticulosis of the colon is noted without inflammation. Aortic Atherosclerosis (ICD10-I70.0). Electronically Signed   By:  Lupita Raider M.D.   On: 04/16/2019 20:55  DG Abd Portable 1V  Result Date: 04/16/2019 CLINICAL DATA:  NG tube placement EXAM: PORTABLE ABDOMEN - 1 VIEW COMPARISON:  CT 04/16/2019 FINDINGS: NG tube tip is near the GE junction with the side port in the distal esophagus. Recommend advancing several cm. IMPRESSION: NG tube tip near the GE junction. Recommend advancing several cm for optimal positioning. Electronically Signed   By: Rolm Baptise M.D.   On: 04/16/2019 22:44    ROS - all of the below systems have been reviewed with the patient and positives are indicated with bold text General: chills, fever or night sweats Eyes: blurry vision or double vision ENT: epistaxis or sore throat Allergy/Immunology: itchy/watery eyes or nasal congestion Hematologic/Lymphatic: bleeding problems, blood clots or swollen lymph nodes Endocrine: temperature intolerance or unexpected weight changes Breast: new or changing breast lumps or nipple discharge Resp: cough, shortness of breath, or wheezing CV: chest pain or dyspnea on exertion GI: as per HPI GU: dysuria, trouble voiding, or hematuria MSK: joint pain or joint stiffness Neuro: TIA or stroke symptoms Derm: pruritus and skin lesion changes Psych: anxiety and depression  PE Blood pressure (!) 147/76, pulse 86, temperature 98.7 F (37.1 C), resp. rate 19, height 5\' 3"  (1.6 m), weight 68.9 kg, SpO2 95 %. Constitutional: NAD; conversant; no deformities Eyes: Moist conjunctiva; no lid lag; anicteric; PERRL Neck: Trachea midline; no thyromegaly Lungs: Normal respiratory effort; no tactile fremitus CV: RRR; no palpable thrills; no pitting edema GI: Abd soft, mildly distended; no palpable hepatosplenomegaly MSK: Normal range of motion of extremities; no clubbing/cyanosis Psychiatric: Appropriate affect; alert and oriented x3 Lymphatic: No palpable cervical or axillary lymphadenopathy  Results for orders placed or performed during the hospital  encounter of 04/16/19 (from the past 48 hour(s))  Comprehensive metabolic panel     Status: Abnormal   Collection Time: 04/16/19  7:07 PM  Result Value Ref Range   Sodium 139 135 - 145 mmol/L   Potassium 3.4 (L) 3.5 - 5.1 mmol/L   Chloride 103 98 - 111 mmol/L   CO2 25 22 - 32 mmol/L   Glucose, Bld 108 (H) 70 - 99 mg/dL    Comment: Glucose reference range applies only to samples taken after fasting for at least 8 hours.   BUN 13 8 - 23 mg/dL   Creatinine, Ser 0.87 0.44 - 1.00 mg/dL   Calcium 9.5 8.9 - 10.3 mg/dL   Total Protein 8.7 (H) 6.5 - 8.1 g/dL   Albumin 4.0 3.5 - 5.0 g/dL   AST 26 15 - 41 U/L   ALT 17 0 - 44 U/L   Alkaline Phosphatase 79 38 - 126 U/L   Total Bilirubin 0.8 0.3 - 1.2 mg/dL   GFR calc non Af Amer >60 >60 mL/min   GFR calc Af Amer >60 >60 mL/min   Anion gap 11 5 - 15    Comment: Performed at [facility], [address] [place], [place] [address]  Lipase, blood     Status: None   Collection Time: 04/16/19  7:07 PM  Result Value Ref Range   Lipase 36 11 - 51 U/L    Comment: Performed at [facility], [address] [place], [place] [address]  CBC with Diff     Status: Abnormal   Collection Time: 04/16/19  7:07 PM  Result Value Ref Range   WBC 7.8 4.0 - 10.5 K/uL   RBC 5.08 3.87 - 5.11 MIL/uL   Hemoglobin 13.4 12.0 -  15.0 g/dL   HCT 32.9 92.4 - 26.8 %   MCV 81.7 80.0 - 100.0 fL   MCH 26.4 26.0 - 34.0 pg   MCHC 32.3 30.0 - 36.0 g/dL   RDW 34.1 (H) 96.2 - 22.9 %   Platelets 276 150 - 400 K/uL   nRBC 0.0 0.0 - 0.2 %   Neutrophils Relative % 68 %   Neutro Abs 5.2 1.7 - 7.7 K/uL   Lymphocytes Relative 20 %   Lymphs Abs 1.6 0.7 - 4.0 K/uL   Monocytes Relative 10 %   Monocytes Absolute 0.8 0.1 - 1.0 K/uL   Eosinophils Relative 1 %   Eosinophils Absolute 0.1 0.0 - 0.5 K/uL   Basophils Relative 1 %   Basophils Absolute 0.1 0.0 - 0.1 K/uL   Immature Granulocytes 0 %   Abs Immature Granulocytes 0.03 0.00 - 0.07 K/uL    Comment:  Performed at [facility], [address] [place], [place] [address]  Urinalysis, Routine w reflex microscopic     Status: Abnormal   Collection Time: 04/16/19  7:07 PM  Result Value Ref Range   Color, Urine YELLOW YELLOW   APPearance CLEAR CLEAR   Specific Gravity, Urine 1.015 1.005 - 1.030   pH 6.5 5.0 - 8.0   Glucose, UA NEGATIVE NEGATIVE mg/dL   Hgb urine dipstick NEGATIVE NEGATIVE   Bilirubin Urine NEGATIVE NEGATIVE   Ketones, ur NEGATIVE NEGATIVE mg/dL   Protein, ur NEGATIVE NEGATIVE mg/dL   Nitrite NEGATIVE NEGATIVE   Leukocytes,Ua TRACE (A) NEGATIVE    Comment: Performed at [facility], [address] [place], [place] [address]  Urinalysis, Microscopic (reflex)     Status: Abnormal   Collection Time: 04/16/19  7:07 PM  Result Value Ref Range   RBC / HPF NONE SEEN 0 - 5 RBC/hpf   WBC, UA 6-10 0 - 5 WBC/hpf   Bacteria, UA RARE (A) NONE SEEN   Squamous Epithelial / LPF 6-10 0 - 5    Comment: Performed at [facility], [address] [place], [place] [address]  Respiratory Panel by RT PCR (Flu A&B, Covid) - Nasopharyngeal Swab     Status: None   Collection Time: 04/16/19 10:03 PM   Specimen: Nasopharyngeal Swab  Result Value Ref Range   SARS Coronavirus 2 by RT PCR NEGATIVE NEGATIVE    Comment: (NOTE) SARS-CoV-2 target nucleic acids are NOT DETECTED. The SARS-CoV-2 RNA is generally detectable in upper respiratoy specimens during the acute phase of infection. The lowest concentration of SARS-CoV-2 viral copies this assay can detect is 131 copies/mL. A negative result does not preclude SARS-Cov-2 infection and should not be used as the sole basis for treatment or other patient management decisions. A negative result may occur with  improper specimen collection/handling, submission of specimen other than nasopharyngeal swab, presence of viral mutation(s) within the areas targeted by this assay, and inadequate number of viral copies (<131  copies/mL). A negative result must be combined with clinical observations, patient history, and epidemiological information. The expected result is Negative. Fact Sheet for Patients:  https://www.moore.com/ Fact Sheet for Healthcare Providers:  https://www.young.biz/ This test is not yet ap proved or cleared by the [place] FDA and  has been authorized for detection and/or diagnosis of SARS-CoV-2 by FDA under an Emergency Use Authorization (EUA). This EUA will remain  in effect (meaning this test can be used) for the duration of the COVID-19 declaration under Section 564(b)(1)  of the Act, 21 U.S.C. section 360bbb-3(b)(1), unless the authorization is terminated or revoked sooner.    Influenza A by PCR NEGATIVE NEGATIVE   Influenza B by PCR NEGATIVE NEGATIVE    Comment: (NOTE) The Xpert Xpress SARS-CoV-2/FLU/RSV assay is intended as an aid in  the diagnosis of influenza from Nasopharyngeal swab specimens and  should not be used as a sole basis for treatment. Nasal washings and  aspirates are unacceptable for Xpert Xpress SARS-CoV-2/FLU/RSV  testing. Fact Sheet for Patients: https://www.moore.com/ Fact Sheet for Healthcare Providers: https://www.young.biz/ This test is not yet approved or cleared by the [place] FDA and  has been authorized for detection and/or diagnosis of SARS-CoV-2 by  FDA under an Emergency Use Authorization (EUA). This EUA will remain  in effect (meaning this test can be used) for the duration of the  Covid-19 declaration under Section 564(b)(1) of the Act, 21  U.S.C. section 360bbb-3(b)(1), unless the authorization is  terminated or revoked. Performed at [facility], [address] [place], [place] [address]   CBC     Status: Abnormal   Collection Time: 04/17/19  5:19 AM  Result Value Ref Range   WBC 7.3 4.0 - 10.5 K/uL   RBC 4.65 3.87 - 5.11 MIL/uL    Hemoglobin 11.8 (L) 12.0 - 15.0 g/dL   HCT 67.8 93.8 - 10.1 %   MCV 81.9 80.0 - 100.0 fL   MCH 25.4 (L) 26.0 - 34.0 pg   MCHC 31.0 30.0 - 36.0 g/dL   RDW 75.1 (H) 02.5 - 85.2 %   Platelets 272 150 - 400 K/uL   nRBC 0.0 0.0 - 0.2 %    Comment: Performed at [facility], [address], [place], [place] [address]    CT ABDOMEN PELVIS W CONTRAST  Result Date: 04/16/2019 CLINICAL DATA:  Abdominal pain. EXAM: CT ABDOMEN AND PELVIS WITH CONTRAST TECHNIQUE: Multidetector CT imaging of the abdomen and pelvis was performed using the standard protocol following bolus administration of intravenous contrast. CONTRAST:  OMNIPAQUE IOHEXOL 300 MG/ML  SOLN COMPARISON:  None. FINDINGS: Lower chest: No acute abnormality. Hepatobiliary: No focal liver abnormality is seen. Status post cholecystectomy. No biliary dilatation. Pancreas: 11 mm rounded low density is seen in pancreatic body. Mild ductal dilatation is noted. No acute inflammation is noted. Spleen: Normal in size without focal abnormality. Adrenals/Urinary Tract: Adrenal glands appear normal. Small bilateral renal cysts are noted. No hydronephrosis or renal obstruction is noted. No renal or ureteral calculi are noted. Urinary bladder is unremarkable. Stomach/Bowel: The stomach appears normal. There appears to be proximal small bowel dilatation secondary to obstruction from small bowel loops entering large left lower quadrant ventral hernia. Another large right lower quadrant ventral hernia is noted which contains loops of large and small bowel, but does not result in obstruction on this side. Diverticulosis is noted throughout the colon. No inflammation is noted. The appendix is not visualized. Vascular/Lymphatic: Aortic atherosclerosis. No enlarged abdominal or pelvic lymph nodes. Reproductive: Uterus and bilateral adnexa are unremarkable. Other: No ascites is noted. Musculoskeletal: No acute or significant osseous findings. IMPRESSION:  1. Large left lower quadrant ventral hernia is noted which contains loops of large and small bowel, which results in proximal small bowel obstruction. 2. Another large right lower quadrant ventral hernia is noted which contains loops of large and small bowel, but does not result in obstruction on this side. 3. 11 mm rounded low density is seen in pancreatic body with mild  ductal dilatation. Follow-up MRI with and without gadolinium is recommended in 2 years to ensure stability. 4. Diverticulosis of the colon is noted without inflammation. Aortic Atherosclerosis (ICD10-I70.0). Electronically Signed   By: Lupita RaiderJames  Green Jr M.D.   On: 04/16/2019 20:55   DG Abd Portable 1V  Result Date: 04/16/2019 CLINICAL DATA:  NG tube placement EXAM: PORTABLE ABDOMEN - 1 VIEW COMPARISON:  CT 04/16/2019 FINDINGS: NG tube tip is near the GE junction with the side port in the distal esophagus. Recommend advancing several cm. IMPRESSION: NG tube tip near the GE junction. Recommend advancing several cm for optimal positioning. Electronically Signed   By: Charlett NoseKevin  Dover M.D.   On: 04/16/2019 22:44     A/P: Verlin Festeradine Zammit is an 79 y.o. female with large, bilateral lower quadrant hernias.  There is a SBO associated with the left side.  NG in place.  No signs of acute abd.  Will start SBO protocol this am.   Vanita PandaAlicia C Amey Hossain, MD  Colorectal and General Surgery [facility]  [facility] Use [email or web address] to contact on call provider

## 2019-04-17 NOTE — Progress Notes (Signed)
Advanced ng tube 2cm  Draining light brown to LIS

## 2019-04-17 NOTE — Progress Notes (Signed)
Per abdominal scan advice NG was advanced approximately 6 cm. Pt tolerated well. Will continue to monitor.

## 2019-04-18 NOTE — Progress Notes (Signed)
Subjective/Chief Complaint: Patient comfortable Had a bowel movement yesterday Follow-up x-ray showed all of the contrast within the colon with no sign of SBO   Objective: Vital signs in last 24 hours: Temp:  [98.1 F (36.7 C)-98.7 F (37.1 C)] 98.3 F (36.8 C) (03/21 0605) Pulse Rate:  [77-85] 79 (03/21 0605) Resp:  [14-20] 20 (03/21 0605) BP: (135-148)/(76-85) 140/80 (03/21 0605) SpO2:  [95 %-100 %] 95 % (03/21 0605) Last BM Date: 04/17/19  Intake/Output from previous day: 03/20 0701 - 03/21 0700 In: 130 [P.O.:90; NG/GT:40] Out: 450 [Emesis/NG output:450] Intake/Output this shift: No intake/output data recorded.  General appearance: alert, cooperative and no distress GI: soft, non-tender; palpable right inguinal hernia with large hernia sac in right side of abdomen - partially reducible  Lab Results:  Recent Labs    04/16/19 1907 04/17/19 0519  WBC 7.8 7.3  HGB 13.4 11.8*  HCT 41.5 38.1  PLT 276 272   BMET Recent Labs    04/16/19 1907 04/17/19 0519  NA 139 141  K 3.4* 3.6  CL 103 106  CO2 25 26  GLUCOSE 108* 126*  BUN 13 11  CREATININE 0.87 0.75  CALCIUM 9.5 9.1   PT/INR No results for input(s): LABPROT, INR in the last 72 hours. ABG No results for input(s): PHART, HCO3 in the last 72 hours.  Invalid input(s): PCO2, PO2  Studies/Results: CT ABDOMEN PELVIS W CONTRAST  Result Date: 04/16/2019 CLINICAL DATA:  Abdominal pain. EXAM: CT ABDOMEN AND PELVIS WITH CONTRAST TECHNIQUE: Multidetector CT imaging of the abdomen and pelvis was performed using the standard protocol following bolus administration of intravenous contrast. CONTRAST:  132mL OMNIPAQUE IOHEXOL 300 MG/ML  SOLN COMPARISON:  None. FINDINGS: Lower chest: No acute abnormality. Hepatobiliary: No focal liver abnormality is seen. Status post cholecystectomy. No biliary dilatation. Pancreas: 11 mm rounded low density is seen in pancreatic body. Mild ductal dilatation is noted. No acute  inflammation is noted. Spleen: Normal in size without focal abnormality. Adrenals/Urinary Tract: Adrenal glands appear normal. Small bilateral renal cysts are noted. No hydronephrosis or renal obstruction is noted. No renal or ureteral calculi are noted. Urinary bladder is unremarkable. Stomach/Bowel: The stomach appears normal. There appears to be proximal small bowel dilatation secondary to obstruction from small bowel loops entering large left lower quadrant ventral hernia. Another large right lower quadrant ventral hernia is noted which contains loops of large and small bowel, but does not result in obstruction on this side. Diverticulosis is noted throughout the colon. No inflammation is noted. The appendix is not visualized. Vascular/Lymphatic: Aortic atherosclerosis. No enlarged abdominal or pelvic lymph nodes. Reproductive: Uterus and bilateral adnexa are unremarkable. Other: No ascites is noted. Musculoskeletal: No acute or significant osseous findings. IMPRESSION: 1. Large left lower quadrant ventral hernia is noted which contains loops of large and small bowel, which results in proximal small bowel obstruction. 2. Another large right lower quadrant ventral hernia is noted which contains loops of large and small bowel, but does not result in obstruction on this side. 3. 11 mm rounded low density is seen in pancreatic body with mild ductal dilatation. Follow-up MRI with and without gadolinium is recommended in 2 years to ensure stability. 4. Diverticulosis of the colon is noted without inflammation. Aortic Atherosclerosis (ICD10-I70.0). Electronically Signed   By: Marijo Conception M.D.   On: 04/16/2019 20:55   DG Abd Portable 1V-Small Bowel Obstruction Protocol-initial, 8 hr delay  Result Date: 04/17/2019 CLINICAL DATA:  Abdominal pain, bloating EXAM: PORTABLE ABDOMEN -  1 VIEW COMPARISON:  04/16/2019 FINDINGS: Nasogastric tube side port terminates at the level of the GE junction. Persistently dilated  loop of small bowel within the central pelvis. There is air and contrast within large bowel suggesting incomplete or resolving obstruction. Bowel is seen extending into inguinal hernia on the right. No gross free intraperitoneal air. IMPRESSION: 1. Nasogastric tube side port terminates at the level of the GE junction. Recommend advancement at least 5 cm. 2. Persistently dilated loop of small bowel within the central pelvis. There is air and contrast within large bowel suggesting incomplete or resolving obstruction. Electronically Signed   By: Duanne Guess D.O.   On: 04/17/2019 17:59   DG Abd Portable 1V  Result Date: 04/16/2019 CLINICAL DATA:  NG tube placement EXAM: PORTABLE ABDOMEN - 1 VIEW COMPARISON:  CT 04/16/2019 FINDINGS: NG tube tip is near the GE junction with the side port in the distal esophagus. Recommend advancing several cm. IMPRESSION: NG tube tip near the GE junction. Recommend advancing several cm for optimal positioning. Electronically Signed   By: Charlett Nose M.D.   On: 04/16/2019 22:44    Anti-infectives: Anti-infectives (From admission, onward)   None      Assessment/Plan: SBO secondary to large bilateral inguinal hernias with incarcerated SBO - obstruction has resolved D/C NG tube Clear liquids - advance as tolerated Encourage ambulation   LOS: 2 days    Dominique Ochoa 04/18/2019

## 2019-04-19 NOTE — Progress Notes (Signed)
CC: Abdominal pain  Subjective: Patient feels much better this a.m.  She had 3 bowel movements yesterday and one this morning.  Tolerating full liquids.  She currently lives with her son.  Objective: Vital signs in last 24 hours: Temp:  [97.6 F (36.4 C)-98.1 F (36.7 C)] 97.7 F (36.5 C) (03/22 0506) Pulse Rate:  [73-75] 75 (03/22 0506) Resp:  [18-20] 20 (03/22 0506) BP: (130-144)/(74-79) 144/79 (03/22 0506) SpO2:  [96 %-100 %] 96 % (03/22 0506) Last BM Date: 04/18/19 Voided x5 BM x1 this a.m. Afebrile vital signs are stable No labs/radiology exam. Intake/Output from previous day: No intake/output data recorded. Intake/Output this shift: No intake/output data recorded.  General appearance: alert, cooperative and no distress Resp: clear to auscultation bilaterally GI: soft, non-tender; bowel sounds normal; no masses,  no organomegaly  Lab Results:  Recent Labs    04/16/19 1907 04/17/19 0519  WBC 7.8 7.3  HGB 13.4 11.8*  HCT 41.5 38.1  PLT 276 272    BMET Recent Labs    04/16/19 1907 04/17/19 0519  NA 139 141  K 3.4* 3.6  CL 103 106  CO2 25 26  GLUCOSE 108* 126*  BUN 13 11  CREATININE 0.87 0.75  CALCIUM 9.5 9.1   PT/INR No results for input(s): LABPROT, INR in the last 72 hours.  Recent Labs  Lab 04/16/19 1907  AST 26  ALT 17  ALKPHOS 79  BILITOT 0.8  PROT 8.7*  ALBUMIN 4.0     Lipase     Component Value Date/Time   LIPASE 36 04/16/2019 1907     Prior to Admission medications   Medication Sig Start Date End Date Taking? Authorizing Provider  albuterol (PROVENTIL HFA;VENTOLIN HFA) 108 (90 Base) MCG/ACT inhaler Inhale 1 puff into the lungs every 6 (six) hours as needed for wheezing or shortness of breath.   Yes [provider]  albuterol (PROVENTIL) (2.5 MG/3ML) 0.083% nebulizer solution Take 1 vial by nebulization every 6 (six) hours as needed for wheezing or shortness of breath.  01/28/19  Yes [provider]   amLODipine (NORVASC) 5 MG tablet Take 5 mg by mouth daily.   Yes [provider]  levothyroxine (SYNTHROID, LEVOTHROID) 25 MCG tablet Take 25 mcg by mouth daily before breakfast.   Yes [provider]  montelukast (SINGULAIR) 10 MG tablet Take 10 mg by mouth daily. 03/26/19  Yes [provider]  Multiple Vitamin (MULTIVITAMIN WITH MINERALS) TABS tablet Take 1 tablet by mouth daily.   Yes [provider]  Vitamin D, Ergocalciferol, (DRISDOL) 50000 units CAPS capsule Take 50,000 Units by mouth every 7 (seven) days. Tuesday of each week   Yes [provider]    Medications: . amLODipine  5 mg Oral Daily  . enoxaparin (LOVENOX) injection  40 mg Subcutaneous QHS  . levothyroxine  25 mcg Oral QAC breakfast  . montelukast  10 mg Oral Daily   . dextrose 5 % and 0.45 % NaCl with KCl 20 mEq/L 50 mL/hr at 04/18/19 1116    Assessment/Plan Hypertension Hypothyroid Hx of anemia Hx asthma Hx GERD CKD   SBO secondary to large bilateral inguinal hernias with incarcerated SBO - obstruction has resolved  FEN: IV fluids/full liquids ID: None DVT: Lovenox Follow-up: TBD   Plan: Advance to soft diet and may be able to go home later today.  She will need to have these complex hernias repaired at some point.  This is the first time she has had  a small bowel obstruction.  LOS: 3 days    Shevelle Smither 04/19/2019 Please see Amion

## 2019-04-19 NOTE — Discharge Instructions (Signed)
Bowel Obstruction No lifting over 10 pounds. A bowel obstruction means that something is blocking the small or large bowel. The bowel is also called the intestine. It is the long tube that connects the stomach to the opening of the butt (anus). When something blocks the bowel, food and fluids cannot pass through like normal. This condition needs to be treated. Treatment depends on the cause of the problem and how bad the problem is. What are the causes? Common causes of this condition include:  Scar tissue (adhesions) from past surgery or from high-energy X-rays (radiation).  Recent surgery in the belly. This affects how food moves in the bowel.  Some diseases, such as: ? Irritation of the lining of the digestive tract (Crohn's disease). ? Irritation of small pouches in the bowel (diverticulitis).  Growths or tumors.  A bulging organ (hernia).  Twisting of the bowel (volvulus).  A foreign body.  Slipping of a part of the bowel into another part (intussusception). What are the signs or symptoms? Symptoms of this condition include:  Pain in the belly.  Feeling sick to your stomach (nauseous).  Throwing up (vomiting).  Bloating in the belly.  Being unable to pass gas.  Trouble pooping (constipation).  Watery poop (diarrhea).  A lot of belching. How is this diagnosed? This condition may be diagnosed based on:  A physical exam.  Medical history.  Imaging tests, such as X-ray or CT scan.  Blood tests.  Urine tests. How is this treated? Treatment for this condition may include:  Fluids and pain medicines that are given through an IV tube. Your doctor may tell you not to eat or drink if you feel sick to your stomach and are throwing up.  Eating a clear liquid diet for a few days.  Putting a small tube (nasogastric tube) into the stomach. This will help with pain, discomfort, and nausea by removing blocked air and fluids from the stomach.  Surgery. This may be  needed if other treatments do not work. Follow these instructions at home: Medicines  Take over-the-counter and prescription medicines only as told by your doctor.  If you were prescribed an antibiotic medicine, take it as told by your doctor. Do not stop taking the antibiotic even if you start to feel better. General instructions  Follow your diet as told by your doctor. You may need to: ? Only drink clear liquids until you start to get better. ? Avoid solid foods.  Return to your normal activities as told by your doctor. Ask your doctor what activities are safe for you.  Do not sit for a long time without moving. Get up to take short walks every 1-2 hours. This is important. Ask for help if you feel weak or unsteady.  Keep all follow-up visits as told by your doctor. This is important. How is this prevented? After having a bowel obstruction, you may be more likely to have another. You can do some things to stop it from happening again.  If you have a long-term (chronic) disease, contact your doctor if you see changes or problems.  Take steps to prevent or treat trouble pooping. Your doctor may ask that you: ? Drink enough fluid to keep your pee (urine) pale yellow. ? Take over-the-counter or prescription medicines. ? Eat foods that are high in fiber. These include beans, whole grains, and fresh fruits and vegetables. ? Limit foods that are high in fat and sugar. These include fried or sweet foods.  Stay active. Ask  your doctor which exercises are safe for you.  Avoid stress.  Eat three small meals and three small snacks each day.  Work with a Psychologist, prison and probation services (dietitian) to make a meal plan that works for you.  Do not use any products that contain nicotine or tobacco, such as cigarettes and e-cigarettes. If you need help quitting, ask your doctor. Contact a doctor if:  You have a fever.  You have chills. Get help right away if:  You have pain or cramps that get worse.  You  throw up blood.  You are sick to your stomach.  You cannot stop throwing up.  You cannot drink fluids.  You feel mixed up (confused).  You feel very thirsty (dehydrated).  Your belly gets more bloated.  You feel weak or you pass out (faint). Summary  A bowel obstruction means that something is blocking the small or large bowel.  Treatment may include IV fluids and pain medicine. You may also have a clear liquid diet, a small tube in your stomach, or surgery.  Drink clear liquids and avoid solid foods until you get better. This information is not intended to replace advice given to you by your health care provider. Make sure you discuss any questions you have with your health care provider. Document Revised: 05/28/2017 Document Reviewed: 05/28/2017 Elsevier Patient Education  2020 Else[employer] Inc[employer]    Soft-Food Eating Plan A soft-food eating plan includes foods that are safe and easy to chew and swallow. Your health care provider or dietitian can help you find foods and flavors that fit into this plan. Follow this plan until your health care provider or dietitian says it is safe to start eating other foods and food textures. What are tips for following this plan? General guidelines   Take small bites of food, or cut food into pieces about  inch or smaller. Bite-sized pieces of food are easier to chew and swallow.  Eat moist foods. Avoid overly dry foods.  Avoid foods that: ? Are difficult to swallow, such as dry, chunky, crispy, or sticky foods. ? Are difficult to chew, such as hard, tough, or stringy foods. ? Contain nuts, seeds, or fruits.  Follow instructions from your dietitian about the types of liquids that are safe for you to swallow. You may be allowed to have: ? Thick liquids only. This includes only liquids that are thicker than honey. ? Thin and thick liquids. This includes all beverages and foods that become liquid at room temperature.  To make thick  liquids: ? Purchase a commercial liquid thickening powder. These are available at grocery stores and pharmacies. ? Mix the thickener into liquids according to instructions on the label. ? Purchase ready-made thickened liquids. ? Thicken soup by pureeing, straining to remove chunks, and adding flour, potato flakes, or corn starch. ? Add commercial thickener to foods that become liquid at room temperature, such as milk shakes, yogurt, ice cream, gelatin, and sherbet.  Ask your health care provider whether you need to take a fiber supplement. Cooking  Cook meats so they stay tender and moist. Use methods like braising, stewing, or baking in liquid.  Cook vegetables and fruit until they are soft enough to be mashed with a fork.  Peel soft, fresh fruits such as peaches, nectarines, and melons.  When making soup, make sure chunks of meat and vegetables are smaller than  inch.  Reheat leftover foods slowly so that a tough crust does not form. What foods are allowed? The items  listed below may not be a complete list. Talk with your dietitian about what dietary choices are best for you. Grains Breads, muffins, pancakes, or waffles moistened with syrup, jelly, or butter. Dry cereals well-moistened with milk. Moist, cooked cereals. Well-cooked pasta and rice. Vegetables All soft-cooked vegetables. Shredded lettuce. Fruits All canned and cooked fruits. Soft, peeled fresh fruits. Strawberries. Dairy Milk. Cream. Yogurt. Cottage cheese. Soft cheese without the rind. Meats and other protein foods Tender, moist ground meat, poultry, or fish. Meat cooked in gravy or sauces. Eggs. Sweets and desserts Ice cream. Milk shakes. Sherbet. Pudding. Fats and oils Butter. Margarine. Olive, canola, sunflower, and grapeseed oil. Smooth salad dressing. Smooth cream cheese. Mayonnaise. Gravy. What foods are not allowed? The items listed bemay not be a complete list. Talk with your dietitian about what dietary  choices are best for you. Grains Coarse or dry cereals, such as bran, granola, and shredded wheat. Tough or chewy crusty breads, such as [place] bread or baguettes. Breads with nuts, seeds, or fruit. Vegetables All raw vegetables. Cooked corn. Cooked vegetables that are tough or stringy. Tough, crisp, fried potatoes and potato skins. Fruits Fresh fruits with skins or seeds, or both, such as apples, pears, and grapes. Stringy, high-pulp fruits, such as papaya, pineapple, coconut, and mango. Fruit leather and all dried fruit. Dairy Yogurt with nuts or coconut. Meats and other protein foods Hard, dry sausages. Dry meat, poultry, or fish. Meats with gristle. Fish with bones. Fried meat or fish. Lunch meat and hotdogs. Nuts and seeds. Chunky peanut butter or other nut butters. Sweets and desserts Cakes or cookies that are very dry or chewy. Desserts with dried fruit, nuts, or coconut. Fried pastries. Very rich pastries. Fats and oils Cream cheese with fruit or nuts. Salad dressings with seeds or chunks. Summary  A soft-food eating plan includes foods that are safe and easy to swallow. Generally, the foods should be soft enough to be mashed with a fork.  Avoid foods that are dry, hard to chew, crunchy, sticky, stringy, or crispy.  Ask your health care provider whether you need to thicken your liquids and if you need to take a fiber supplement. This information is not intended to replace advice given to you by your health care provider. Make sure you discuss any questions you have with your health care provider. Document Revised: 05/07/2018 Document Reviewed: 03/19/2016 Elsevier Patient Education  2020 [employer].

## 2019-04-19 NOTE — Progress Notes (Signed)
Pt is being discharged home today. Discharge instructions including follow up appointments and medications given. Pt had no further questions at this time.

## 2019-04-19 NOTE — Care Management Important Message (Signed)
Important Message  Patient Details IM Letter given to Daryel Gerald SW Case Manager to present to the Patient Name: Dominique Ochoa MRN: [id number] Date of Birth: [birth year]   Medicare Important Message Given:  Yes     Caren Macadam 04/19/2019, 12:28 PM

## 2019-04-22 NOTE — Discharge Summary (Signed)
Physician Discharge Summary  Patient ID: Tashonna Descoteaux MRN: [id number] DOB/AGE: Jan 31, 1940 79 y.o.  Admit date: 04/16/2019 Discharge date: 04/19/2019  Admission Diagnoses:  SBO Bilateral large inguinal hernias Hypertension Hypothyroid History of anemia GERD Chronic kidney disease  Discharge Diagnoses:  Same   Active Problems:   Bowel obstruction ([place])   Ventral hernia with bowel obstruction   PROCEDURES: None  Hospital Course:  Dominique Ochoa is an 79 y.o. female who presented to [facility] with bloating and abd pain which has slowly been getting worse for the past 2 days.  She denies any nausea.  She tried laxatives with no relief.  Pt has a h/o of known hernias that have been asymptomatic.  H/o lap chole by Dr Lucia Gaskins. She was seen in the emergency department by Dr. Leighton Ruff and admitted.  He was started on the small bowel protocol.  Following a.m. she was comfortable she had a bowel movement there was contrast in the colon her diet was advanced.  By 04/19/2019, we were able to advance her diet.  She tolerated a soft diet was discharged home.  She will require follow-up for repair of her bilateral inguinal hernias.  She is being referred to Dr. Ralene Ok with follow-up instructions below.  CBC Latest Ref Rng & Units 04/17/2019 04/16/2019 04/06/2018  WBC 4.0 - 10.5 K/uL 7.3 7.8 12.5(H)  Hemoglobin 12.0 - 15.0 g/dL 11.8(L) 13.4 8.8(L)  Hematocrit 36.0 - 46.0 % 38.1 41.5 30.0(L)  Platelets 150 - 400 K/uL 272 276 211   CMP Latest Ref Rng & Units 04/17/2019 04/16/2019 04/05/2018  Glucose 70 - 99 mg/dL 126(H) 108(H) 149(H)  BUN 8 - 23 mg/dL 11 13 20   Creatinine 0.44 - 1.00 mg/dL 0.75 0.87 1.20(H)  Sodium 135 - 145 mmol/L 141 139 139  Potassium 3.5 - 5.1 mmol/L 3.6 3.4(L) 4.5  Chloride 98 - 111 mmol/L 106 103 112(H)  CO2 22 - 32 mmol/L 26 25 22   Calcium 8.9 - 10.3 mg/dL 9.1 9.5 8.2(L)  Total Protein 6.5 - 8.1 g/dL - 8.7(H) 6.1(L)  Total Bilirubin 0.3 - 1.2 mg/dL - 0.8 2.2(H)   Alkaline Phos 38 - 126 U/L - 79 146(H)  AST 15 - 41 U/L - 26 162(H)  ALT 0 - 44 U/L - 17 138(H)     Condition on discharge: Improved   Disposition: Discharge disposition: 01-Home or Self Care        Allergies as of 04/19/2019      Reactions   Fish Allergy Other (See Comments)   Makes her weak   Fish Oil Other (See Comments), Swelling   MD orders   Lisinopril Swelling   Facial swelling   Sulfamethoxazole-trimethoprim Itching, Swelling   Facial swelling      Medication List    TAKE these medications   albuterol 108 (90 Base) MCG/ACT inhaler Commonly known as: VENTOLIN HFA Inhale 1 puff into the lungs every 6 (six) hours as needed for wheezing or shortness of breath.   albuterol (2.5 MG/3ML) 0.083% nebulizer solution Commonly known as: PROVENTIL Take 1 vial by nebulization every 6 (six) hours as needed for wheezing or shortness of breath.   amLODipine 5 MG tablet Commonly known as: NORVASC Take 5 mg by mouth daily.   levothyroxine 25 MCG tablet Commonly known as: SYNTHROID Take 25 mcg by mouth daily before breakfast.   montelukast 10 MG tablet Commonly known as: SINGULAIR Take 10 mg by mouth daily.   multivitamin with minerals Tabs tablet Take 1 tablet  by mouth daily.   Vitamin D (Ergocalciferol) 1.25 MG (50000 UNIT) Caps capsule Commonly known as: DRISDOL Take 50,000 Units by mouth every 7 (seven) days. Tuesday of each week      Follow-up Information    Axel Filler, MD Follow up on 05/19/2019.   Specialty: General Surgery Why: Your appointment is at 2: 10 PM.  Be at the office 30 minutes early for check in.  Bring photo ID and insurance information.   Contact information: [address] [place] [address] [phone]        Albertina Senegal, MD Follow up.   Specialty: Internal Medicine Why: Let Dr. Julius Bowels know you were in the hospital, and follow up for medical issues.   Contact information: [address] [place]  [place] [address] [phone]           Signed: Sherrie George 04/22/2019, 4:23 PM

## 2019-10-21 ENCOUNTER — Emergency Department (HOSPITAL_BASED_OUTPATIENT_CLINIC_OR_DEPARTMENT_OTHER): Payer: Medicare Other

## 2019-10-21 ENCOUNTER — Emergency Department (HOSPITAL_BASED_OUTPATIENT_CLINIC_OR_DEPARTMENT_OTHER)
Admission: EM | Admit: 2019-10-21 | Discharge: 2019-10-21 | Disposition: A | Discharge status: Home / Self Care | Payer: Medicare Other | Attending: Emergency Medicine | Admitting: Emergency Medicine

## 2019-10-21 ENCOUNTER — Other Ambulatory Visit: Payer: Self-pay

## 2019-10-21 ENCOUNTER — Encounter (HOSPITAL_BASED_OUTPATIENT_CLINIC_OR_DEPARTMENT_OTHER): Payer: Self-pay | Admitting: Emergency Medicine

## 2019-10-21 DIAGNOSIS — J45909 Unspecified asthma, uncomplicated: Secondary | ICD-10-CM | POA: Diagnosis not present

## 2019-10-21 DIAGNOSIS — Z79899 Other long term (current) drug therapy: Secondary | ICD-10-CM | POA: Insufficient documentation

## 2019-10-21 DIAGNOSIS — R000 Tachycardia, unspecified: Secondary | ICD-10-CM | POA: Diagnosis not present

## 2019-10-21 DIAGNOSIS — I129 Hypertensive chronic kidney disease with stage 1 through stage 4 chronic kidney disease, or unspecified chronic kidney disease: Secondary | ICD-10-CM | POA: Diagnosis not present

## 2019-10-21 DIAGNOSIS — N182 Chronic kidney disease, stage 2 (mild): Secondary | ICD-10-CM | POA: Diagnosis not present

## 2019-10-21 DIAGNOSIS — R002 Palpitations: Secondary | ICD-10-CM | POA: Insufficient documentation

## 2019-10-21 DIAGNOSIS — E039 Hypothyroidism, unspecified: Secondary | ICD-10-CM | POA: Diagnosis not present

## 2019-10-21 DIAGNOSIS — Z79890 Hormone replacement therapy (postmenopausal): Secondary | ICD-10-CM | POA: Insufficient documentation

## 2019-10-21 LAB — CBC WITH DIFFERENTIAL/PLATELET
Abs Immature Granulocytes: 0.01 10*3/uL (ref 0.00–0.07)
Basophils Absolute: 0.1 10*3/uL (ref 0.0–0.1)
Basophils Relative: 1 %
Eosinophils Absolute: 0 10*3/uL (ref 0.0–0.5)
Eosinophils Relative: 1 %
HCT: 39.5 % (ref 36.0–46.0)
Hemoglobin: 12.4 g/dL (ref 12.0–15.0)
Immature Granulocytes: 0 %
Lymphocytes Relative: 36 %
Lymphs Abs: 1.7 10*3/uL (ref 0.7–4.0)
MCH: 26.3 pg (ref 26.0–34.0)
MCHC: 31.4 g/dL (ref 30.0–36.0)
MCV: 83.9 fL (ref 80.0–100.0)
Monocytes Absolute: 0.5 10*3/uL (ref 0.1–1.0)
Monocytes Relative: 10 %
Neutro Abs: 2.4 10*3/uL (ref 1.7–7.7)
Neutrophils Relative %: 52 %
Platelets: 304 10*3/uL (ref 150–400)
RBC: 4.71 MIL/uL (ref 3.87–5.11)
RDW: 15.4 % (ref 11.5–15.5)
WBC: 4.7 10*3/uL (ref 4.0–10.5)
nRBC: 0 % (ref 0.0–0.2)

## 2019-10-21 LAB — COMPREHENSIVE METABOLIC PANEL
ALT: 12 U/L (ref 0–44)
AST: 25 U/L (ref 15–41)
Albumin: 3.9 g/dL (ref 3.5–5.0)
Alkaline Phosphatase: 71 U/L (ref 38–126)
Anion gap: 12 (ref 5–15)
BUN: 9 mg/dL (ref 8–23)
CO2: 26 mmol/L (ref 22–32)
Calcium: 9.3 mg/dL (ref 8.9–10.3)
Chloride: 101 mmol/L (ref 98–111)
Creatinine, Ser: 0.98 mg/dL (ref 0.44–1.00)
GFR calc Af Amer: >60 mL/min (ref >60)
GFR calc non Af Amer: 54 mL/min — ABNORMAL LOW (ref >60)
Glucose, Bld: 128 mg/dL — ABNORMAL HIGH (ref 70–99)
Potassium: 3.6 mmol/L (ref 3.5–5.1)
Sodium: 139 mmol/L (ref 135–145)
Total Bilirubin: 0.6 mg/dL (ref 0.3–1.2)
Total Protein: 7.9 g/dL (ref 6.5–8.1)

## 2019-10-21 LAB — MAGNESIUM: Magnesium: 2 mg/dL (ref 1.7–2.4)

## 2019-10-21 LAB — D-DIMER, QUANTITATIVE: D-Dimer, Quant: 2.1 ug/mL-FEU — ABNORMAL HIGH (ref 0.00–0.50)

## 2019-10-21 LAB — TSH: TSH: 1.563 u[IU]/mL (ref 0.350–4.500)

## 2019-10-21 MED ORDER — IOHEXOL 350 MG/ML SOLN
100.0000 mL | Freq: Once | INTRAVENOUS | Status: AC | PRN
  Administered 2019-10-21: 75 mL via INTRAVENOUS

## 2019-10-21 NOTE — Discharge Instructions (Signed)
All the blood work and x-rays looked normal today.

## 2019-10-21 NOTE — ED Provider Notes (Signed)
[facility] EMERGENCY DEPARTMENT Provider Note   CSN: [id number] Arrival date & time: 10/21/19  0845     History No chief complaint on file.   Dondi Burandt is a 79 y.o. female.  Patient is an 79 year old female with a history of chronic kidney disease, hypertension, hypothyroidism, asthma who is presenting today with a 24-hour history of intermittent palpitations. Patient reports the symptoms started yesterday. They occurred at rest or with exertion and did not seem to be related to exertion. She reports in the morning yesterday she had an episode last 20 minutes that made her feel winded and she had to sit down. When they resolved her symptoms of shortness of breath also resolved. She then had another episode yesterday afternoon that again resolved spontaneously. This morning she had a brief episode for a few minutes but is not having any symptoms at this time. She denies any chest tightness, cough, fever. She has noticed some swelling in her right leg but denies any pain in her calf. No prior history of blood clots. Patient does not take anticoagulation. She reports she did have an episode of palpitations years ago and was told she drink too much caffeine but she reports she has not been drinking much caffeine recently but did have some tea which her son took away from her today as well as her coffee. She denies any abdominal pain, nausea or vomiting. She denies any recent medication changes.  The history is provided by the patient.       Past Medical History:  Diagnosis Date  . Asthma   . GERD (gastroesophageal reflux disease)   . Hypertension   . Thyroid disease     Patient Active Problem List   Diagnosis Date Noted  . Bowel obstruction (HCC) 04/16/2019  . Ventral hernia with bowel obstruction 04/16/2019  . Acute cholecystitis 04/03/2018  . Sepsis (HCC) 04/03/2018  . Gallstone 04/03/2018  . Elevated troponin 04/03/2018  . Near syncope 04/20/2017  .  Normocytic anemia 04/20/2017  . 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 Injury of right toe, sequela 04/20/2017  . Pain in the chest 09/16/2015  . HTN (hypertension) 09/16/2015  . Hypothyroidism 09/16/2015  . Asthma 09/16/2015  . GERD (gastroesophageal reflux disease) 09/16/2015  . Hyperglycemia 09/16/2015  . Abnormal finding on chest xray 09/16/2015    Past Surgical History:  Procedure Laterality Date  . CHOLECYSTECTOMY N/A 04/04/2018   Procedure: LAPAROSCOPIC CHOLECYSTECTOMY WITH INTRAOPERATIVE CHOLANGIOGRAM;  Surgeon: Ovidio Kin, MD;  Location: [facility];  Service: General;  Laterality: N/A;     OB History   No obstetric history on file.     Family History  Problem Relation Age of Onset  . Hypertension Son     Social History   Tobacco Use  . Smoking status: Never Smoker  . Smokeless tobacco: Never Used  Substance Use Topics  . Alcohol use: No  . Drug use: No    Home Medications Prior to Admission medications   Medication Sig Start Date End Date Taking? Authorizing Provider  albuterol (PROVENTIL HFA;VENTOLIN HFA) 108 (90 Base) MCG/ACT inhaler Inhale 1 puff into the lungs every 6 (six) hours as needed for wheezing or shortness of breath.   Yes [provider]  albuterol (PROVENTIL) (2.5 MG/3ML) 0.083% nebulizer solution Take 1 vial by nebulization every 6 (six) hours as needed for wheezing or shortness of breath.  01/28/19  Yes [provider]  amLODipine (NORVASC) 5 MG tablet Take 5 mg by  mouth daily.   Yes [provider]  levothyroxine (SYNTHROID, LEVOTHROID) 25 MCG tablet Take 25 mcg by mouth daily before breakfast.   Yes [provider]  montelukast (SINGULAIR) 10 MG tablet Take 10 mg by mouth daily. 03/26/19  Yes [provider]  Multiple Vitamin (MULTIVITAMIN WITH MINERALS) TABS tablet Take 1 tablet by mouth daily.   Yes [provider]  Vitamin D, Ergocalciferol, (DRISDOL) 50000 units CAPS capsule  Take 50,000 Units by mouth every 7 (seven) days. Tuesday of each week   Yes [provider]    Allergies    Fish allergy, Fish oil, Lisinopril, and Sulfamethoxazole-trimethoprim  Review of Systems   Review of Systems  All other systems reviewed and are negative.   Physical Exam Updated Vital Signs BP 134/68   Pulse 80   Temp 98.1 F (36.7 C) (Oral)   Resp 19   Ht 5\' 3"  (1.6 m)   Wt 66.7 kg   SpO2 100%   BMI 26.04 kg/m   Physical Exam Vitals and nursing note reviewed.  Constitutional:      General: She is not in acute distress.    Appearance: Normal appearance. She is well-developed and normal weight.  HENT:     Head: Normocephalic and atraumatic.     Mouth/Throat:     Mouth: Mucous membranes are moist.  Eyes:     Pupils: Pupils are equal, round, and reactive to light.  Cardiovascular:     Rate and Rhythm: Regular rhythm. Tachycardia present. Frequent extrasystoles are present.    Heart sounds: Normal heart sounds. No murmur heard.  No friction rub.     Comments: Intermittent tachycardia with frequent PVCs Pulmonary:     Effort: Pulmonary effort is normal.     Breath sounds: Normal breath sounds. No wheezing or rales.  Abdominal:     General: Bowel sounds are normal. There is no distension.     Palpations: Abdomen is soft.     Tenderness: There is no abdominal tenderness. There is no guarding or rebound.  Musculoskeletal:        General: No tenderness. Normal range of motion.     Right lower leg: No edema.     Left lower leg: No edema.     Comments: No edema  Skin:    General: Skin is warm and dry.     Findings: No rash.  Neurological:     General: No focal deficit present.     Mental Status: She is alert and oriented to person, place, and time. Mental status is at baseline.     Cranial Nerves: No cranial nerve deficit.  Psychiatric:        Mood and Affect: Mood normal.        Behavior: Behavior normal.        Thought Content: Thought content  normal.     ED Results / Procedures / Treatments   Labs (all labs ordered are listed, but only abnormal results are displayed) Labs Reviewed  COMPREHENSIVE METABOLIC PANEL - Abnormal; Notable for the following components:      Result Value   Glucose, Bld 128 (*)    GFR calc non Af Amer 54 (*)    All other components within normal limits  D-DIMER, QUANTITATIVE (NOT AT [facility]) - Abnormal; Notable for the following components:   D-Dimer, Quant 2.10 (*)    All other components within normal limits  CBC WITH DIFFERENTIAL/PLATELET  MAGNESIUM  TSH    EKG EKG Interpretation  Date/Time:  Thursday October 21 2019 09:02:14 EDT Ventricular Rate:  81 PR Interval:    QRS Duration: 149 QT Interval:  424 QTC Calculation: 493 R Axis:   -57 Text Interpretation: Sinus rhythm new Multiple premature complexes, vent & supraven RBBB and LAFB Probable left ventricular hypertrophy Confirmed by Gwyneth Sprout ([id number]) on 10/21/2019 9:39:53 AM   Radiology CT Angio Chest PE W and/or Wo Contrast  Result Date: 10/21/2019 CLINICAL DATA:  Tachycardia.  Positive D-dimer EXAM: CT ANGIOGRAPHY CHEST WITH CONTRAST TECHNIQUE: Multidetector CT imaging of the chest was performed using the standard protocol during bolus administration of intravenous contrast. Multiplanar CT image reconstructions and MIPs were obtained to evaluate the vascular anatomy. CONTRAST:  64mL OMNIPAQUE IOHEXOL 350 MG/ML SOLN COMPARISON:  CT angiogram chest October 09, 2016 FINDINGS: Cardiovascular: There is no demonstrable pulmonary embolus. There is no appreciable thoracic aortic aneurysm or dissection. Visualized great vessels appear normal. There is aortic atherosclerosis. No pericardial effusion or pericardial thickening. Main pulmonary outflow tract diameter measures 3.6 cm, stable, enlarged. Mediastinum/Nodes: Thyroid appears unremarkable. No evident thoracic adenopathy. No esophageal lesions appreciable. Lungs/Pleura: There is slight  bibasilar atelectasis. There is no edema or airspace opacity. No evident pleural effusions. 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 Upper Abdomen: Visualized upper abdominal structures appear unremarkable beyond the posterior left foramen of Bochdalek hernia Musculoskeletal: There are foci of degenerative change in the thoracic spine. No blastic or lytic bone lesions. No evident chest wall lesions. Review of the MIP images confirms the above findings. IMPRESSION: 1. No demonstrable pulmonary embolus. No thoracic aortic aneurysm or dissection. There is aortic atherosclerosis. 2. Enlargement of the main pulmonary outflow tract is indicative of pulmonary arterial hypertension. 3.  Mild bibasilar atelectasis.  Lungs otherwise clear. 4. 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 5.  No evident adenopathy. Aortic Atherosclerosis (ICD10-I70.0). Electronically Signed   By: Bretta Bang III M.D.   On: 10/21/2019 10:57    Procedures Procedures (including critical care time)  Medications Ordered in ED Medications  iohexol (OMNIPAQUE) 350 MG/ML injection 100 mL (75 mLs Intravenous Contrast Given 10/21/19 1033)    ED Course  I have reviewed the triage vital signs and the nursing notes.  Pertinent labs & imaging results that were available during my care of the patient were reviewed by me and considered in my medical decision making (see chart for details).    MDM Rules/Calculators/A&P                          Patient is an 79 year old elderly female presenting today with complaint of palpitations. Shortness of breath also present only with palpitations. Initially upon arrival here patient's blood pressure sure is mildly elevated and heart rate ranges anywhere from 105 to 70s. She is having frequent PVCs but appears to be in a sinus rhythm. She has no recent medication changes or recent illness. She is well-appearing on exam but had reported some  pain in her right ankle and may be some swelling. No prior history of clot however with her age she is at an increased risk with the intermittent tachycardia. She does have a history of thyroid disease but has not recently had changes in her Synthroid. Symptoms could be intermittent SVT or atrial fibrillation but no evidence of that at this time. However also with the leg pain swelling and palpitations will check a D-dimer. D-dimer was elevated even age-adjusted so we  will do a CT to ensure no further abnormalities. CBC within normal limits without evidence of anemia, CMP without acute findings, magnesium within normal limits. TSH is still pending.  11:26 AM Labs are reassuring and CTA neg for PE.  HR now consistently in the 70's.  Will d/c home with cardiology f/u and possible zio patch  MDM Number of Diagnoses or Management Options   Amount and/or Complexity of Data Reviewed Clinical lab tests: ordered and reviewed Tests in the radiology section of CPT: ordered and reviewed Tests in the medicine section of CPT: ordered and reviewed Decide to obtain previous medical records or to obtain history from someone other than the patient: yes Obtain history from someone other than the patient: no Review and summarize past medical records: yes Discuss the patient with other providers: no Independent visualization of images, tracings, or specimens: yes  Risk of Complications, Morbidity, and/or Mortality Presenting problems: moderate Diagnostic procedures: low Management options: low  Patient Progress Patient progress: stable    Final Clinical Impression(s) / ED Diagnoses Final diagnoses:  Palpitation    Rx / DC Orders ED Discharge Orders    None       Gwyneth SproutPlunkett, Neils [place]ac[place], MD 10/21/19 1129

## 2019-10-21 NOTE — ED Triage Notes (Signed)
"   My heart has been beating fast since yesterday" at times, denies at present

## 2019-10-21 NOTE — ED Notes (Signed)
ED Provider at bedside. 

## 2019-10-21 NOTE — ED Notes (Signed)
Patient transported to CT 

## 2019-10-21 NOTE — ED Notes (Signed)
Up to BR, gait steady 

## 2019-11-16 DIAGNOSIS — I10 Essential (primary) hypertension: Secondary | ICD-10-CM | POA: Insufficient documentation

## 2019-11-16 DIAGNOSIS — E559 Vitamin D deficiency, unspecified: Secondary | ICD-10-CM

## 2019-11-16 DIAGNOSIS — E079 Disorder of thyroid, unspecified: Secondary | ICD-10-CM | POA: Insufficient documentation

## 2019-11-16 DIAGNOSIS — E785 Hyperlipidemia, unspecified: Secondary | ICD-10-CM | POA: Insufficient documentation

## 2019-11-16 DIAGNOSIS — J4520 Mild intermittent asthma, uncomplicated: Secondary | ICD-10-CM

## 2019-11-16 HISTORY — DX: Vitamin D deficiency, unspecified: E55.9

## 2019-11-16 HISTORY — DX: Hyperlipidemia, unspecified: E78.5

## 2019-11-16 HISTORY — DX: Mild intermittent asthma, uncomplicated: J45.20

## 2019-11-16 NOTE — Progress Notes (Signed)
Cardiology Office Note:    Date:  11/17/2019   ID:  Dominique Ochoa, DOB July 29, 1940, MRN [id number]  PCP:  Redmond School, NP  Cardiologist:  Norman Herrlich, MD   Referring MD: Redmond School, NP  ASSESSMENT:    1. Palpitations   2. Primary hypertension   3. Right bundle branch block (RBBB) with left anterior hemiblock    PLAN:    In order of problems listed above:  1. She is improved in her age group with hypertension is a risk for atrial fibrillation will apply 1 week ZIO monitor.  If she has sustained arrhythmia she require treatment of atrial fibrillation anticoagulation and if unremarkable I would not perform any further evaluation at this time 2. Essential hypertension stable continue current treatment 3. Asymptomatic.  If she had first-degree AV block or syncope would require pacemaker. 4. Coronary artery calcification not uncommon almost expected in her age group and I do not see a trigger for [place] to do a myocardial perfusion study or ischemia evaluation such as cardiac CTA 5. Known atrial septal aneurysm PFO incidental findings I would not consider anticoagulation  Next appointment 6 weeks to follow-up on the monitor   Medication Adjustments/Labs and Tests Ordered: Current medicines are reviewed at length with the patient today.  Concerns regarding medicines are outlined above.  Orders Placed This Encounter  Procedures  . LONG TERM MONITOR (3-14 DAYS)  . EKG 12-Lead   No orders of the defined types were placed in this encounter.    Chief Complaint  Patient presents with  . Follow-up    ED visit with palpitation    History of Present Illness:    Dominique Ochoa is a 79 y.o. female who is being seen today for the evaluation of palpitation at the request of Redmond School, NP. She is in a [facility] ED 10/21/2019.  Her EKG showed sinus rhythm with multiple PVCs bifascicular heart block. Labs include a CMP potassium 3.6 creatinine 0.98 GFR 54 cc CBC was  normal TSH normal magnesium normal 2.0 her D-dimer was elevated. With elevated D-dimer she underwent a chest CTA there is no evidence of pulmonary embolism cardiovascular structures show the presence of aortic atherosclerosis there is no coronary artery calcification.  There is stable enlargement of the main pulmonary artery and mild atelectasis.  She has a good quality of life she lives with her son still active doing housework outside the home has never had a heart disease and has no shortness of breath syncope or chest pain.  She tells me she has been told over the years that she has skipped beats and was told by her family doctor Dr. Donata Duff not to worry about it.  Since the ED visit she has not had a recurrence and she does me she is dropped caffeine from her diet.  The day she went to the emergency room she said her heart was quite irregular rapid and made her apprehensive and last up to 30 minutes and she thinks that she was improved by the time she was seen in the emergency room.  She is seen by Dr. 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  Previous monitor performed 2018 [facility] cardiology showed rare PVCs rare atrial premature contractions. Past Medical History:  Diagnosis Date  . Abnormal finding on chest xray 09/16/2015  . Acute  cholecystitis 04/03/2018  . Asthma   . B12 deficiency 02/03/2018  . Bowel obstruction (HCC) 04/16/2019  . 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 Elevated troponin 04/03/2018  . Gallstone 04/03/2018  . GERD (gastroesophageal reflux disease)   . HTN (hypertension) 09/16/2015  . Hyperglycemia 09/16/2015  . Hyperlipidemia 11/16/2019  . Hypertension   . Hypothyroidism 09/16/2015  . Injury of right toe, sequela 04/20/2017  . Mild intermittent asthma without complication 11/16/2019  . Near syncope 04/20/2017  . Normocytic anemia  04/20/2017  . OSA (obstructive sleep apnea) 01/24/2017   Formatting of this note might be different from the original. Sx cleared after wt loss  . Pain in the chest 09/16/2015  . PSVT (paroxysmal supraventricular tachycardia) (HCC) 05/22/2016  . Sepsis (HCC) 04/03/2018  . Thyroid disease   . Ventral hernia with bowel obstruction 04/16/2019  . Vitamin D deficiency disease 11/16/2019    Past Surgical History:  Procedure Laterality Date  . CHOLECYSTECTOMY N/A 04/04/2018   Procedure: LAPAROSCOPIC CHOLECYSTECTOMY WITH INTRAOPERATIVE CHOLANGIOGRAM;  Surgeon: Ovidio Kin, MD;  Location: [facility] ORS;  Service: General;  Laterality: N/A;    Current Medications: Current Meds  Medication Sig  . albuterol (PROVENTIL HFA;VENTOLIN HFA) 108 (90 Base) MCG/ACT inhaler Inhale 1 puff into the lungs every 6 (six) hours as needed for wheezing or shortness of breath.  Marland Kitchen albuterol (PROVENTIL) (2.5 MG/3ML) 0.083% nebulizer solution Take 1 vial by nebulization every 6 (six) hours as needed for wheezing or shortness of breath.   Marland Kitchen amLODipine (NORVASC) 5 MG tablet Take 5 mg by mouth daily.  . Cyanocobalamin (VITAMIN B-12 IJ) Inject as directed every 30 (thirty) days.  Marland Kitchen levothyroxine (SYNTHROID, LEVOTHROID) 25 MCG tablet Take 25 mcg by mouth daily before breakfast.  . montelukast (SINGULAIR) 10 MG tablet Take 10 mg by mouth daily.  . Multiple Vitamin (MULTIVITAMIN WITH MINERALS) TABS tablet Take 1 tablet by mouth daily.  . Vitamin D, Ergocalciferol, (DRISDOL) 50000 units CAPS capsule Take 50,000 Units by mouth every 7 (seven) days. Tuesday of each week     Allergies:   Fish allergy, Fish oil, Lisinopril, and Sulfamethoxazole-trimethoprim   Social History   Socioeconomic History  . Marital status: Widowed    Spouse name: Not on file  . Number of children: Not on file  . Years of education: Not on file  . Highest education level: Not on file  Occupational History  . Not on file  Tobacco Use  . Smoking status:  Never Smoker  . Smokeless tobacco: Never Used  Substance and Sexual Activity  . Alcohol use: No  . Drug use: No  . Sexual activity: Not on file  Other Topics Concern  . Not on file  Social History Narrative  . Not on file   Social Determinants of Health   Financial Resource Strain:   . Difficulty of Paying Living Expenses: Not on file  Food Insecurity:   . Worried About Programme researcher, broadcasting/film/video in the Last Year: Not on file  . Ran Out of Food in the Last Year: Not on file  Transportation Needs:   . Lack of Transportation (Medical): Not on file  . Lack of Transportation (Non-Medical): Not on file  Physical Activity:   . Days of Exercise per Week: Not on file  . Minutes of Exercise per Session: Not on file  Stress:   . Feeling of Stress : Not on file  Social Connections:   . Frequency of Communication with Friends and Family: Not  on file  . Frequency of Social Gatherings with Friends and Family: Not on file  . Attends Religious Services: Not on file  . Active Member of Clubs or Organizations: Not on file  . Attends Banker Meetings: Not on file  . Marital Status: Not on file     Family History: The patient's family history includes Hypertension in her son.  ROS:   ROS Please see the history of present illness.     All other systems reviewed and are negative.  EKGs/Labs/Other Studies Reviewed:    The following studies were reviewed today:   EKG:  EKG is  ordered today.  The ekg ordered today is personally reviewed and demonstrates sinus rhythm she has APCs and PVCs right bundle branch block relatively narrow QRS 138 ms left anterior fascicular block and a normal PR interval  Recent Labs: 10/21/2019: ALT 12; BUN 9; Creatinine, Ser 0.98; Hemoglobin 12.4; Magnesium 2.0; Platelets 304; Potassium 3.6; Sodium 139; TSH 1.563  Recent Lipid Panel    Component Value Date/Time   CHOL 98 04/04/2018 0222   TRIG 27 04/04/2018 0222   HDL 40 (L) 04/04/2018 0222   CHOLHDL  2.5 04/04/2018 0222   VLDL 5 04/04/2018 0222   LDLCALC 53 04/04/2018 0222    Physical Exam:    VS:  BP (!) 158/62   Pulse 90   Ht 5\' 5"  (1.651 m)   Wt 148 lb 1.9 oz (67.2 kg)   SpO2 99%   BMI 24.65 kg/m     Wt Readings from Last 3 Encounters:  11/17/19 148 lb 1.9 oz (67.2 kg)  10/21/19 147 lb (66.7 kg)  04/16/19 152 lb (68.9 kg)     GEN: She looks frail well nourished, well developed in no acute distress HEENT: Normal NECK: No JVD; No carotid bruits LYMPHATICS: No lymphadenopathy CARDIAC: RRR, no murmurs, rubs, gallops RESPIRATORY:  Clear to auscultation without rales, wheezing or rhonchi  ABDOMEN: Soft, non-tender, non-distended MUSCULOSKELETAL:  No edema; No deformity  SKIN: Warm and dry NEUROLOGIC:  Alert and oriented x 3 PSYCHIATRIC:  Normal affect     Signed, 04/18/19, MD  11/17/2019 11:28 AM    [facility]

## 2019-11-17 ENCOUNTER — Encounter: Payer: Self-pay | Admitting: Cardiology

## 2019-11-17 ENCOUNTER — Ambulatory Visit (INDEPENDENT_AMBULATORY_CARE_PROVIDER_SITE_OTHER): Payer: Medicare Other

## 2019-11-17 ENCOUNTER — Ambulatory Visit: Payer: Medicare Other | Admitting: Cardiology

## 2019-11-17 ENCOUNTER — Other Ambulatory Visit: Payer: Self-pay

## 2019-11-17 VITALS — BP 158/62 | HR 90 | Ht 65.0 in | Wt 148.1 lb

## 2019-11-17 DIAGNOSIS — I10 Essential (primary) hypertension: Secondary | ICD-10-CM

## 2019-11-17 DIAGNOSIS — R002 Palpitations: Secondary | ICD-10-CM

## 2019-11-17 DIAGNOSIS — I452 Bifascicular block: Secondary | ICD-10-CM

## 2019-11-17 NOTE — Patient Instructions (Signed)
Medication Instructions:  Your physician recommends that you continue on your current medications as directed. Please refer to the Current Medication list given to you today.  *If you need a refill on your cardiac medications before your next appointment, please call your pharmacy*   Lab Work: None If you have labs (blood work) drawn today and your tests are completely normal, you will receive your results only by: . MyChart Message (if you have MyChart) OR . A paper copy in the mail If you have any lab test that is abnormal or we need to change your treatment, we will call you to review the results.   Testing/Procedures: A zio monitor was ordered today. It will remain on for 7 days. You will then return monitor and event diary in provided box. It takes 1-2 weeks for report to be downloaded and returned to us. We will call you with the results. If monitor falls off or has orange flashing light, please call Zio for further instructions.      Follow-Up: At [facility], you and your health needs are our priority.  As part of our continuing mission to provide you with exceptional heart care, we have created designated Provider Care Teams.  These Care Teams include your primary Cardiologist (physician) and Advanced Practice Providers (APPs -  Physician Assistants and Nurse Practitioners) who all work together to provide you with the care you need, when you need it.  We recommend signing up for the patient portal called "MyChart".  Sign up information is provided on this After Visit Summary.  MyChart is used to connect with patients for Virtual Visits (Telemedicine).  Patients are able to view lab/test results, encounter notes, upcoming appointments, etc.  Non-urgent messages can be sent to your provider as well.   To learn more about what you can do with MyChart, go to [email or web address].com.    Your next appointment:   6 week(s)  The format for your next appointment:   In  Person  Provider:   Brian Munley, MD   Other Instructions  

## 2019-12-08 ENCOUNTER — Telehealth: Payer: Self-pay

## 2019-12-08 NOTE — Telephone Encounter (Signed)
Left message on patients voicemail to please return our call.   

## 2019-12-08 NOTE — Telephone Encounter (Signed)
-----   Message from Brian J Munley, MD sent at 12/07/2019  4:44 PM EST ----- She has arrhythmia on the monitor but no bradycardia suggested pacemaker no atrial fibrillation.  I think she would benefit from low-dose beta-blocker Sectral 200 mg daily  If she is hesitant she can wait and discuss with me in the office in follow-up 

## 2019-12-10 ENCOUNTER — Telehealth: Payer: Self-pay

## 2019-12-10 NOTE — Telephone Encounter (Signed)
-----   Message from Baldo Daub, MD sent at 12/07/2019  4:44 PM EST ----- She has arrhythmia on the monitor but no bradycardia suggested pacemaker no atrial fibrillation.  I think she would benefit from low-dose beta-blocker Sectral 200 mg daily  If she is hesitant she can wait and discuss with me in the office in follow-up

## 2019-12-10 NOTE — Telephone Encounter (Signed)
Left message on patients voicemail to please return our call.   

## 2019-12-13 ENCOUNTER — Telehealth: Payer: Self-pay

## 2019-12-13 MED ORDER — ACEBUTOLOL HCL 200 MG PO CAPS: 200.0000 mg | ORAL_CAPSULE | Freq: Every day | ORAL | 3 refills | Status: DC

## 2019-12-13 NOTE — Telephone Encounter (Signed)
Left message on patients voicemail to please return our call.   

## 2019-12-13 NOTE — Telephone Encounter (Signed)
-----   Message from Brian J Munley, MD sent at 12/07/2019  4:44 PM EST ----- She has arrhythmia on the monitor but no bradycardia suggested pacemaker no atrial fibrillation.  I think she would benefit from low-dose beta-blocker Sectral 200 mg daily  If she is hesitant she can wait and discuss with me in the office in follow-up 

## 2019-12-13 NOTE — Telephone Encounter (Signed)
Spoke with patient regarding results and recommendation.  Patient verbalizes understanding and is agreeable to plan of care. Advised patient to call back with any issues or concerns.  

## 2019-12-13 NOTE — Telephone Encounter (Signed)
Patient returning call.

## 2019-12-16 ENCOUNTER — Telehealth: Payer: Self-pay | Admitting: Cardiology

## 2019-12-16 NOTE — Telephone Encounter (Signed)
Spoke to the patient just now and she let me know that her Acebutolol 200 mg daily is making her dizzy and short of breath when she is taking it. She is not currently dizzy but does state that she still feels slightly short of breath right now. She states that she can not take this medication any longer. She does not have any blood pressure readings for me at this time. She states that she will start recording her heart rate and blood pressure for [place] daily.   I will route to Dr. Dulce Sellar for further review.   Patient requests a call back at the following number:  [phone]

## 2019-12-16 NOTE — Telephone Encounter (Signed)
Left patient a detailed message letting her know Dr. Hulen Shouts recommendations. I also gave our call back number for her to call back with any issues or concerns.

## 2019-12-16 NOTE — Telephone Encounter (Signed)
Pt c/o medication issue:  1. Name of Medication: acebutolol (SECTRAL) 200 MG capsule  2. How are you currently taking this medication (dosage and times per day)?  Started taking it yesterday   3. Are you having a reaction (difficulty breathing--STAT)? Dizziness, short winded last night - felt like she was having an asthma attack. Still a little short winded this morning   4. What is your medication issue?  Dizziness, felt like she was having an asthma attack last night but is better this morning, still short winded though

## 2019-12-16 NOTE — Telephone Encounter (Signed)
It is uncommon but at times beta-blocker can precipitate asthma  I would stop the Sectral.

## 2020-01-04 NOTE — Progress Notes (Signed)
Cardiology Office Note:    Date:  01/05/2020   ID:  Dominique Ochoa, DOB 01/24/1941, MRN [id number]  PCP:  Redmond School, NP  Cardiologist:  Norman Herrlich, MD    Referring MD: Redmond School, NP    ASSESSMENT:    1. Essential hypertension   2. Palpitations   3. Frequent PVCs   4. PSVT (paroxysmal supraventricular tachycardia) (HCC)   5. PFO with atrial septal aneurysm    PLAN:    In order of problems listed above:  1. Essential hypertension with mildly elevated SBP in office to 140s today. I recommend patient take blood pressure at home several times a week and notify our office if persistently obtaining BP >140/90. Also discussed dietary interventions to support lowering blood pressure such as limiting salt intake by eating fresh or frozen vegetables, choosing low-sodium products, and cooking at home. 2. Patient is occasionally symptomatic with PVCs and APCs, but reports that using acebutalol daily was too much for her. Recommend she use acebutalol prn for symptomatic palpitations. 3. Known atrial septal aneurysm PFO incidentally found on echocardiogram. At age 74, I would not consider anticoagulation.  Next appointment: 1 year    Medication Adjustments/Labs and Tests Ordered: Current medicines are reviewed at length with the patient today.  Concerns regarding medicines are outlined above.  No orders of the defined types were placed in this encounter.  Meds ordered this encounter  Medications  . acebutolol (SECTRAL) 200 MG capsule    Sig: Take 1 capsule (200 mg total) by mouth daily as needed.    Dispense:  90 capsule    Refill:  3    No chief complaint on file.   History of Present Illness:    Dominique Ochoa is a 79 y.o. female with a hx of palpitation last seen 11/17/2019.  She utilized an event monitor for 7 days beginning 11/17/2019 showed occasional PVCs with couplets and triplets and brief accelerated idioventricular rhythm so occasional APCs and brief  runs of APCs.  Her single symptomatic event was associated with PVCs.  She was seen in 2020 , echocardiogram showed moderate elevation of pulmonary artery pressure atrial septal aneurysm and small PFO. She reports having occasional palpitations, but that acebutalol was "too much" for her, thus she stopped using it daily. She does not regularly take her blood pressure at home, but reports that this is something she can start.  Compliance with diet, lifestyle and medications: moderate. Patient reports eating cheese puffs, frequently dines out at restaurants.  Past Medical History:  Diagnosis Date  . Abnormal finding on chest xray 09/16/2015  . Acute cholecystitis 04/03/2018  . Asthma   . B12 deficiency 02/03/2018  . Bowel obstruction (HCC) 04/16/2019  . 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 Elevated troponin 04/03/2018  . Gallstone 04/03/2018  . GERD (gastroesophageal reflux disease)   . HTN (hypertension) 09/16/2015  . Hyperglycemia 09/16/2015  . Hyperlipidemia 11/16/2019  . Hypertension   . Hypothyroidism 09/16/2015  . Injury of right toe, sequela 04/20/2017  . Mild intermittent asthma without complication 11/16/2019  . Near syncope 04/20/2017  . Normocytic anemia 04/20/2017  . OSA (obstructive sleep apnea) 01/24/2017   Formatting of this note might be different from the original. Sx cleared after wt loss  . Pain in the chest 09/16/2015  . PSVT (paroxysmal supraventricular tachycardia) (HCC) 05/22/2016  . Sepsis (HCC) 04/03/2018  . Thyroid disease   . Ventral hernia with bowel obstruction 04/16/2019  . Vitamin D  deficiency disease 11/16/2019    Past Surgical History:  Procedure Laterality Date  . CHOLECYSTECTOMY N/A 04/04/2018   Procedure: LAPAROSCOPIC CHOLECYSTECTOMY WITH INTRAOPERATIVE CHOLANGIOGRAM;  Surgeon: Ovidio Kin, MD;  Location: [facility];  Service: General;  Laterality: N/A;    Current Medications: Current Meds  Medication Sig  . acebutolol (SECTRAL) 200 MG capsule Take 1  capsule (200 mg total) by mouth daily as needed.  Marland Kitchen albuterol (PROVENTIL HFA;VENTOLIN HFA) 108 (90 Base) MCG/ACT inhaler Inhale 1 puff into the lungs every 6 (six) hours as needed for wheezing or shortness of breath.  Marland Kitchen albuterol (PROVENTIL) (2.5 MG/3ML) 0.083% nebulizer solution Take 1 vial by nebulization every 6 (six) hours as needed for wheezing or shortness of breath.   Marland Kitchen amLODipine (NORVASC) 5 MG tablet Take 5 mg by mouth daily.  . Cyanocobalamin (VITAMIN B-12 IJ) Inject as directed every 30 (thirty) days.  Marland Kitchen levothyroxine (SYNTHROID, LEVOTHROID) 25 MCG tablet Take 25 mcg by mouth daily before breakfast.  . montelukast (SINGULAIR) 10 MG tablet Take 10 mg by mouth daily.  . Multiple Vitamin (MULTIVITAMIN WITH MINERALS) TABS tablet Take 1 tablet by mouth daily.  . Vitamin D, Ergocalciferol, (DRISDOL) 50000 units CAPS capsule Take 50,000 Units by mouth every 7 (seven) days. Tuesday of each week  . [DISCONTINUED] acebutolol (SECTRAL) 200 MG capsule Take 1 capsule (200 mg total) by mouth daily.     Allergies:   Fish allergy, Fish oil, Lisinopril, and Sulfamethoxazole-trimethoprim   Social History   Socioeconomic History  . Marital status: Widowed    Spouse name: Not on file  . Number of children: Not on file  . Years of education: Not on file  . Highest education level: Not on file  Occupational History  . Not on file  Tobacco Use  . Smoking status: Never Smoker  . Smokeless tobacco: Never Used  Substance and Sexual Activity  . Alcohol use: No  . Drug use: No  . Sexual activity: Not on file  Other Topics Concern  . Not on file  Social History Narrative  . Not on file   Social Determinants of Health   Financial Resource Strain:   . Difficulty of Paying Living Expenses: Not on file  Food Insecurity:   . Worried About Programme researcher, broadcasting/film/video in the Last Year: Not on file  . Ran Out of Food in the Last Year: Not on file  Transportation Needs:   . Lack of Transportation (Medical):  Not on file  . Lack of Transportation (Non-Medical): Not on file  Physical Activity:   . Days of Exercise per Week: Not on file  . Minutes of Exercise per Session: Not on file  Stress:   . Feeling of Stress : Not on file  Social Connections:   . Frequency of Communication with Friends and Family: Not on file  . Frequency of Social Gatherings with Friends and Family: Not on file  . Attends Religious Services: Not on file  . Active Member of Clubs or Organizations: Not on file  . Attends Banker Meetings: Not on file  . Marital Status: Not on file     Family History: The patient's family history includes Hypertension in her son. ROS:   Please see the history of present illness.    All other systems reviewed and are negative.  EKGs/Labs/Other Studies Reviewed:    The following studies were reviewed today:  Recent Labs: 10/21/2019: ALT 12; BUN 9; Creatinine, Ser 0.98; Hemoglobin 12.4; Magnesium 2.0; Platelets  304; Potassium 3.6; Sodium 139; TSH 1.563  Recent Lipid Panel    Component Value Date/Time   CHOL 98 04/04/2018 0222   TRIG 27 04/04/2018 0222   HDL 40 (L) 04/04/2018 0222   CHOLHDL 2.5 04/04/2018 0222   VLDL 5 04/04/2018 0222   LDLCALC 53 04/04/2018 0222    Physical Exam:    VS:  BP (!) 148/78 (BP Location: Right Arm, Patient Position: Sitting, Cuff Size: Normal)   Pulse 64   Ht 5\' 5"  (1.651 m)   Wt 146 lb (66.2 kg)   SpO2 100%   BMI 24.30 kg/m     Wt Readings from Last 3 Encounters:  01/05/20 146 lb (66.2 kg)  11/17/19 148 lb 1.9 oz (67.2 kg)  10/21/19 147 lb (66.7 kg)     GEN: age-appropriate AA woman resting comfortably in chair, well nourished, well developed in no acute distress HEENT: Normal NECK: No JVD; No carotid bruits LYMPHATICS: No lymphadenopathy CARDIAC: initially rapid with 1-2 irregular beats, subsequently became RRR, no murmurs, rubs, gallops RESPIRATORY:  Clear to auscultation without rales, wheezing or rhonchi  ABDOMEN:  Soft, non-tender, non-distended MUSCULOSKELETAL:  No edema; No deformity  SKIN: Warm and dry NEUROLOGIC:  Alert and oriented x 3 PSYCHIATRIC:  Normal affect    Signed, 10/23/19, MD  01/05/2020 9:04 AM    [facility]

## 2020-01-05 ENCOUNTER — Ambulatory Visit (INDEPENDENT_AMBULATORY_CARE_PROVIDER_SITE_OTHER): Payer: Medicare Other | Admitting: Cardiology

## 2020-01-05 ENCOUNTER — Encounter: Payer: Self-pay | Admitting: Cardiology

## 2020-01-05 ENCOUNTER — Other Ambulatory Visit: Payer: Self-pay

## 2020-01-05 VITALS — BP 148/78 | HR 64 | Ht 65.0 in | Wt 146.0 lb

## 2020-01-05 DIAGNOSIS — I471 Supraventricular tachycardia: Secondary | ICD-10-CM

## 2020-01-05 DIAGNOSIS — R002 Palpitations: Secondary | ICD-10-CM

## 2020-01-05 DIAGNOSIS — Q211 Atrial septal defect: Secondary | ICD-10-CM

## 2020-01-05 DIAGNOSIS — I10 Essential (primary) hypertension: Secondary | ICD-10-CM | POA: Diagnosis not present

## 2020-01-05 DIAGNOSIS — Q2112 Patent foramen ovale: Secondary | ICD-10-CM

## 2020-01-05 DIAGNOSIS — I493 Ventricular premature depolarization: Secondary | ICD-10-CM

## 2020-01-05 MED ORDER — ACEBUTOLOL HCL 200 MG PO CAPS
200.0000 mg | ORAL_CAPSULE | Freq: Every day | ORAL | 3 refills | Status: DC | PRN
Start: 2020-01-05 — End: 2020-09-10

## 2020-01-05 NOTE — Patient Instructions (Signed)
Medication Instructions:  Your physician has recommended you make the following change in your medication:  CHANGE: Sectral 200 mg take one tablet by mouth daily as needed *If you need a refill on your cardiac medications before your next appointment, please call your pharmacy*   Lab Work: None If you have labs (blood work) drawn today and your tests are completely normal, you will receive your results only by: Marland Kitchen MyChart Message (if you have MyChart) OR . A paper copy in the mail If you have any lab test that is abnormal or we need to change your treatment, we will call you to review the results.   Testing/Procedures: None   Follow-Up: At [facility], you and your health needs are our priority.  As part of our continuing mission to provide you with exceptional heart care, we have created designated Provider Care Teams.  These Care Teams include your primary Cardiologist (physician) and Advanced Practice Providers (APPs -  Physician Assistants and Nurse Practitioners) who all work together to provide you with the care you need, when you need it.  We recommend signing up for the patient portal called "MyChart".  Sign up information is provided on this After Visit Summary.  MyChart is used to connect with patients for Virtual Visits (Telemedicine).  Patients are able to view lab/test results, encounter notes, upcoming appointments, etc.  Non-urgent messages can be sent to your provider as well.   To learn more about what you can do with MyChart, go to [email or web address].    Your next appointment:   1 year(s)  The format for your next appointment:   In Person  Provider:   Norman Herrlich, MD   Other Instructions

## 2020-09-06 ENCOUNTER — Ambulatory Visit: Payer: Self-pay | Admitting: General Surgery

## 2020-09-06 NOTE — H&P (Signed)
Subjective   Chief Complaint: Hernia       History of Present Illness: Patient is an 80 year old female, who comes in with a history of hypothyroidism, asthma, chronic kidney disease, and bilateral inguinal hernias.   Patient previously under went consultation in April 2021.  Patient had a CT scan which I reviewed personally.  Patient had bilateral inguinal hernias that were large and extended up to her flanks bilaterally. Patient states that she is continued with what sounds to be some pain and discomfort with bloating.  She states that the hernias can flatten out somewhat however she does still have hernias present.  Patient states that she feels discomfort when laying down in laying on 1 side or the other.       Review of Systems: A complete review of systems was obtained from the patient.  I have reviewed this information and discussed as appropriate with the patient.  See HPI as well for other ROS.   Review of Systems  Constitutional: Negative for fever.  HENT: Negative for congestion.   Eyes: Negative for blurred vision.  Respiratory: Negative for cough, shortness of breath and wheezing.   Cardiovascular: Negative for chest pain and palpitations.  Gastrointestinal: Negative for heartburn.  Genitourinary: Negative for dysuria.  Musculoskeletal: Negative for myalgias.  Skin: Negative for rash.  Neurological: Negative for dizziness and headaches.  Psychiatric/Behavioral: Negative for depression and suicidal ideas.  All other systems reviewed and are negative.       Medical History: Past Medical History Past Medical History: Diagnosis Date  Asthma, unspecified asthma severity, unspecified whether complicated, unspecified whether persistent    GERD (gastroesophageal reflux disease)    Hypertension    Thyroid disease        There is no problem list on file for this patient.     Past Surgical History History reviewed. No pertinent surgical history.      Allergies Allergies Allergen Reactions  Fish Oil Other (See Comments) and Swelling     MD orders    Lisinopril Swelling     Facial swelling Facial swelling    Sulfamethoxazole-Trimethoprim Itching and Swelling     Facial swelling Facial swelling        Current Outpatient Medications on File Prior to Visit Medication Sig Dispense Refill  albuterol (ACCUNEB) 0.63 mg/3 mL nebulizer solution Inhale 0.63 mg into the lungs every 6 (six) hours as needed      amLODIPine (NORVASC) 5 MG tablet Take 5 mg by mouth once daily      ergocalciferol, vitamin D2, 1,250 mcg (50,000 unit) capsule Take 1 capsule by mouth once a week      levothyroxine (SYNTHROID) 25 MCG tablet TAKE 1 TABLET(25 MCG) BY MOUTH DAILY AT 6 AM      montelukast (SINGULAIR) 10 mg tablet Take 10 mg by mouth at bedtime       No current facility-administered medications on file prior to visit.     Family History History reviewed. No pertinent family history.     Social History   Tobacco Use Smoking Status Never Smoker Smokeless Tobacco Never Used     Social History Social History    Socioeconomic History  Marital status: Single Tobacco Use  Smoking status: Never Smoker  Smokeless tobacco: Never Used Advertising account planner Use: Never used Substance and Sexual Activity  Alcohol use: Never  Drug use: Never      Objective:     Vitals:   09/06/20 1107 BP: (!) 142/78 Pulse: 106  Temp: 36.6 C (97.8 F) SpO2: 94% Weight: 63.4 kg (139 lb 12.8 oz) Height: 165.1 cm (5\' 5" ) PainSc: 0-No pain   Body mass index is 23.26 kg/m.   Physical Exam Constitutional:      Appearance: Normal appearance.  HENT:     Head: Normocephalic and atraumatic.     Mouth/Throat:     Mouth: Mucous membranes are moist.     Pharynx: Oropharynx is clear.  Eyes:     General: No scleral icterus.    Pupils: Pupils are equal, round, and reactive to light.  Cardiovascular:     Rate and Rhythm: Normal rate and regular rhythm.      Pulses: Normal pulses.     Heart sounds: No murmur heard.   No friction rub. No gallop.  Pulmonary:     Effort: Pulmonary effort is normal. No respiratory distress.     Breath sounds: Normal breath sounds. No stridor.  Abdominal:     General: Abdomen is flat.     Hernia: A hernia is present. Hernia is present in the left inguinal area and right inguinal area.     Comments: Bilateral hernias that extent cephalad to bilateral flanks.  Musculoskeletal:        General: No swelling.  Skin:    General: Skin is warm.  Neurological:     General: No focal deficit present.     Mental Status: She is alert and oriented to person, place, and time. Mental status is at baseline.  Psychiatric:        Mood and Affect: Mood normal.        Thought Content: Thought content normal.        Judgment: Judgment normal.            Assessment and Plan: Diagnoses and all orders for this visit:   Bilateral recurrent inguinal hernia without obstruction or gangrene     KALYA TROEGER is a 80 y.o. female    1.  We will proceed to the OR for a robotic bilateral inguinal hernia repair with mesh, possible open 2. All risks and benefits were discussed with the patient, to generally include infection, bleeding, damage to surrounding structures, acute and chronic nerve pain, and recurrence. Alternatives were offered and described.  All questions were answered and the patient voiced understanding of the procedure and wishes to proceed at this point.             No follow-ups on file.   97, MD, [facility], [facility] General & Minimally Invasive Surgery

## 2020-09-09 ENCOUNTER — Other Ambulatory Visit: Payer: Self-pay

## 2020-09-09 ENCOUNTER — Encounter (HOSPITAL_BASED_OUTPATIENT_CLINIC_OR_DEPARTMENT_OTHER): Payer: Self-pay

## 2020-09-09 ENCOUNTER — Emergency Department (HOSPITAL_BASED_OUTPATIENT_CLINIC_OR_DEPARTMENT_OTHER): Payer: Medicare Other

## 2020-09-09 ENCOUNTER — Inpatient Hospital Stay (HOSPITAL_BASED_OUTPATIENT_CLINIC_OR_DEPARTMENT_OTHER)
Admission: EM | Admit: 2020-09-09 | Discharge: 2020-09-12 | DRG: 352 | Disposition: A | Discharge status: Home / Self Care | Payer: Medicare Other | Attending: Family Medicine | Admitting: Family Medicine

## 2020-09-09 DIAGNOSIS — K8689 Other specified diseases of pancreas: Secondary | ICD-10-CM

## 2020-09-09 DIAGNOSIS — R1032 Left lower quadrant pain: Secondary | ICD-10-CM | POA: Diagnosis not present

## 2020-09-09 DIAGNOSIS — K436 Other and unspecified ventral hernia with obstruction, without gangrene: Secondary | ICD-10-CM | POA: Diagnosis present

## 2020-09-09 DIAGNOSIS — Z888 Allergy status to other drugs, medicaments and biological substances status: Secondary | ICD-10-CM

## 2020-09-09 DIAGNOSIS — K4000 Bilateral inguinal hernia, with obstruction, without gangrene, not specified as recurrent: Principal | ICD-10-CM | POA: Diagnosis present

## 2020-09-09 DIAGNOSIS — I493 Ventricular premature depolarization: Secondary | ICD-10-CM | POA: Diagnosis present

## 2020-09-09 DIAGNOSIS — E876 Hypokalemia: Secondary | ICD-10-CM | POA: Diagnosis not present

## 2020-09-09 DIAGNOSIS — Z882 Allergy status to sulfonamides status: Secondary | ICD-10-CM

## 2020-09-09 DIAGNOSIS — Z4659 Encounter for fitting and adjustment of other gastrointestinal appliance and device: Secondary | ICD-10-CM

## 2020-09-09 DIAGNOSIS — Z20822 Contact with and (suspected) exposure to covid-19: Secondary | ICD-10-CM | POA: Diagnosis present

## 2020-09-09 DIAGNOSIS — Z8673 Personal history of transient ischemic attack (TIA), and cerebral infarction without residual deficits: Secondary | ICD-10-CM

## 2020-09-09 DIAGNOSIS — K219 Gastro-esophageal reflux disease without esophagitis: Secondary | ICD-10-CM | POA: Diagnosis present

## 2020-09-09 DIAGNOSIS — I2721 Secondary pulmonary arterial hypertension: Secondary | ICD-10-CM | POA: Diagnosis present

## 2020-09-09 DIAGNOSIS — R000 Tachycardia, unspecified: Secondary | ICD-10-CM | POA: Diagnosis not present

## 2020-09-09 DIAGNOSIS — G4733 Obstructive sleep apnea (adult) (pediatric): Secondary | ICD-10-CM | POA: Diagnosis present

## 2020-09-09 DIAGNOSIS — Z79890 Hormone replacement therapy (postmenopausal): Secondary | ICD-10-CM

## 2020-09-09 DIAGNOSIS — E039 Hypothyroidism, unspecified: Secondary | ICD-10-CM | POA: Diagnosis present

## 2020-09-09 DIAGNOSIS — Z91013 Allergy to seafood: Secondary | ICD-10-CM

## 2020-09-09 DIAGNOSIS — J4520 Mild intermittent asthma, uncomplicated: Secondary | ICD-10-CM | POA: Diagnosis present

## 2020-09-09 DIAGNOSIS — E559 Vitamin D deficiency, unspecified: Secondary | ICD-10-CM | POA: Diagnosis present

## 2020-09-09 DIAGNOSIS — Z79899 Other long term (current) drug therapy: Secondary | ICD-10-CM

## 2020-09-09 DIAGNOSIS — Z8249 Family history of ischemic heart disease and other diseases of the circulatory system: Secondary | ICD-10-CM

## 2020-09-09 DIAGNOSIS — E785 Hyperlipidemia, unspecified: Secondary | ICD-10-CM | POA: Diagnosis present

## 2020-09-09 DIAGNOSIS — K56609 Unspecified intestinal obstruction, unspecified as to partial versus complete obstruction: Secondary | ICD-10-CM | POA: Diagnosis present

## 2020-09-09 DIAGNOSIS — J45909 Unspecified asthma, uncomplicated: Secondary | ICD-10-CM | POA: Diagnosis present

## 2020-09-09 DIAGNOSIS — I10 Essential (primary) hypertension: Secondary | ICD-10-CM | POA: Diagnosis present

## 2020-09-09 LAB — URINALYSIS, ROUTINE W REFLEX MICROSCOPIC
Bilirubin Urine: NEGATIVE
Glucose, UA: NEGATIVE mg/dL
Hgb urine dipstick: NEGATIVE
Ketones, ur: 15 mg/dL — AB
Nitrite: NEGATIVE
Protein, ur: 30 mg/dL — AB
Specific Gravity, Urine: 1.02 (ref 1.005–1.030)
pH: 7 (ref 5.0–8.0)

## 2020-09-09 LAB — CBC WITH DIFFERENTIAL/PLATELET
Abs Immature Granulocytes: 0.02 10*3/uL (ref 0.00–0.07)
Basophils Absolute: 0.1 10*3/uL (ref 0.0–0.1)
Basophils Relative: 1 %
Eosinophils Absolute: 0 10*3/uL (ref 0.0–0.5)
Eosinophils Relative: 0 %
HCT: 43.9 % (ref 36.0–46.0)
Hemoglobin: 14.3 g/dL (ref 12.0–15.0)
Immature Granulocytes: 0 %
Lymphocytes Relative: 17 %
Lymphs Abs: 1.3 10*3/uL (ref 0.7–4.0)
MCH: 27.2 pg (ref 26.0–34.0)
MCHC: 32.6 g/dL (ref 30.0–36.0)
MCV: 83.5 fL (ref 80.0–100.0)
Monocytes Absolute: 0.5 10*3/uL (ref 0.1–1.0)
Monocytes Relative: 6 %
Neutro Abs: 5.6 10*3/uL (ref 1.7–7.7)
Neutrophils Relative %: 76 %
Platelets: 291 10*3/uL (ref 150–400)
RBC: 5.26 MIL/uL — ABNORMAL HIGH (ref 3.87–5.11)
RDW: 14.7 % (ref 11.5–15.5)
WBC: 7.4 10*3/uL (ref 4.0–10.5)
nRBC: 0 % (ref 0.0–0.2)

## 2020-09-09 LAB — COMPREHENSIVE METABOLIC PANEL
ALT: 22 U/L (ref 0–44)
AST: 32 U/L (ref 15–41)
Albumin: 4.2 g/dL (ref 3.5–5.0)
Alkaline Phosphatase: 74 U/L (ref 38–126)
Anion gap: 12 (ref 5–15)
BUN: 18 mg/dL (ref 8–23)
CO2: 26 mmol/L (ref 22–32)
Calcium: 9.6 mg/dL (ref 8.9–10.3)
Chloride: 100 mmol/L (ref 98–111)
Creatinine, Ser: 0.93 mg/dL (ref 0.44–1.00)
GFR, Estimated: >60 mL/min (ref >60)
Glucose, Bld: 136 mg/dL — ABNORMAL HIGH (ref 70–99)
Potassium: 3.6 mmol/L (ref 3.5–5.1)
Sodium: 138 mmol/L (ref 135–145)
Total Bilirubin: 0.7 mg/dL (ref 0.3–1.2)
Total Protein: 8.8 g/dL — ABNORMAL HIGH (ref 6.5–8.1)

## 2020-09-09 LAB — LIPASE, BLOOD: Lipase: 39 U/L (ref 11–51)

## 2020-09-09 LAB — URINALYSIS, MICROSCOPIC (REFLEX)

## 2020-09-09 LAB — TROPONIN I (HIGH SENSITIVITY)
Troponin I (High Sensitivity): 12 ng/L (ref <18)
Troponin I (High Sensitivity): 15 ng/L (ref <18)

## 2020-09-09 LAB — RESP PANEL BY RT-PCR (FLU A&B, COVID) ARPGX2
Influenza A by PCR: NEGATIVE
Influenza B by PCR: NEGATIVE
SARS Coronavirus 2 by RT PCR: NEGATIVE

## 2020-09-09 MED ORDER — MORPHINE SULFATE (PF) 4 MG/ML IV SOLN
4.0000 mg | Freq: Once | INTRAVENOUS | Status: AC
  Administered 2020-09-09: 2 mg via INTRAVENOUS
  Filled 2020-09-09: qty 1

## 2020-09-09 MED ORDER — PENTAFLUOROPROP-TETRAFLUOROETH EX AERO
INHALATION_SPRAY | CUTANEOUS | Status: DC | PRN
  Administered 2020-09-09: 30 via TOPICAL
  Filled 2020-09-09: qty 30

## 2020-09-09 MED ORDER — ONDANSETRON HCL 4 MG/2ML IJ SOLN
4.0000 mg | Freq: Once | INTRAMUSCULAR | Status: AC
  Administered 2020-09-09: 4 mg via INTRAVENOUS
  Filled 2020-09-09: qty 2

## 2020-09-09 MED ORDER — IOHEXOL 300 MG/ML  SOLN
100.0000 mL | Freq: Once | INTRAMUSCULAR | Status: AC | PRN
  Administered 2020-09-09: 100 mL via INTRAVENOUS

## 2020-09-09 MED ORDER — SODIUM CHLORIDE 0.9 % IV BOLUS
1000.0000 mL | Freq: Once | INTRAVENOUS | Status: AC
  Administered 2020-09-09: 1000 mL via INTRAVENOUS

## 2020-09-09 MED ORDER — PANTOPRAZOLE SODIUM 20 MG PO TBEC: 20.0000 mg | DELAYED_RELEASE_TABLET | Freq: Once | ORAL | Status: DC

## 2020-09-09 MED ORDER — MORPHINE SULFATE (PF) 2 MG/ML IV SOLN: 2.0000 mg | INTRAVENOUS | Status: DC | PRN

## 2020-09-09 MED ORDER — SODIUM CHLORIDE 0.9 % IV SOLN: Freq: Once | INTRAVENOUS | Status: AC

## 2020-09-09 NOTE — ED Triage Notes (Signed)
Pt c/o abd pain and emesis that started yesterday. Pt has had 2 episodes of emesis today. Pt also endorses frequent belching.

## 2020-09-09 NOTE — ED Provider Notes (Signed)
[facility] EMERGENCY DEPARTMENT Provider Note   CSN: [id number] Arrival date & time: 09/09/20  1840     History Chief Complaint  Patient presents with   Abdominal Pain    Dominique Ochoa is a 80 y.o. female.  She is here with a complaint of left lower quadrant abdominal pain along with nausea and 2 episodes of vomiting that occurred today.  Nonbloody.  She is belching a lot.  She has known bilateral hernias and is getting surgery soon..  She has been constipated, no diarrhea.  No urinary symptoms.  No fevers or chills chest pain or shortness of breath.  The history is provided by the patient.  Abdominal Pain Pain location:  LLQ Pain quality: aching   Pain radiates to:  Does not radiate Pain severity:  Severe Onset quality:  Gradual Duration:  6 hours Timing:  Constant Progression:  Unchanged Chronicity:  New Context: not trauma   Relieved by:  None tried Worsened by:  Nothing Ineffective treatments:  None tried Associated symptoms: constipation, nausea and vomiting   Associated symptoms: no chest pain, no cough, no diarrhea, no dysuria, no fever, no hematemesis, no hematochezia, no hematuria, no shortness of breath and no sore throat       Past Medical History:  Diagnosis Date   Abnormal finding on chest xray 09/16/2015   Acute cholecystitis 04/03/2018   Asthma    B12 deficiency 02/03/2018   Bowel obstruction (HCC) 04/16/2019   CKD (chronic kidney disease), 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 Sx  cleared after wt loss   Pain in the chest 09/16/2015   PSVT (paroxysmal supraventricular tachycardia) (HCC) 05/22/2016   Sepsis (HCC) 04/03/2018   Thyroid disease    Ventral hernia with bowel obstruction 04/16/2019   Vitamin D deficiency disease 11/16/2019    Patient Active Problem List   Diagnosis Date Noted   Hyperlipidemia 11/16/2019   Mild intermittent asthma without complication 11/16/2019   Vitamin D deficiency disease 11/16/2019   Thyroid disease    Hypertension    Bowel obstruction (HCC) 04/16/2019   Ventral hernia with bowel obstruction 04/16/2019   Acute cholecystitis 04/03/2018   Sepsis (HCC) 04/03/2018   Gallstone 04/03/2018   Elevated troponin 04/03/2018   B12 deficiency 02/03/2018   Near syncope 04/20/2017   Normocytic anemia 04/20/2017   CKD (chronic kidney disease), 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   Injury of right toe, sequela 04/20/2017   OSA (obstructive sleep apnea) 01/24/2017   PSVT (paroxysmal supraventricular tachycardia) (HCC) 05/22/2016   Pain in the chest 09/16/2015   Essential hypertension 09/16/2015   Hypothyroidism 09/16/2015   Asthma 09/16/2015   GERD (gastroesophageal reflux disease) 09/16/2015   Hyperglycemia 09/16/2015   Abnormal finding on chest xray 09/16/2015    Past Surgical History:  Procedure Laterality Date   CHOLECYSTECTOMY N/A 04/04/2018   Procedure: LAPAROSCOPIC CHOLECYSTECTOMY WITH INTRAOPERATIVE CHOLANGIOGRAM;  Surgeon: Ovidio KinNewman, David, MD;  Location: [facility];  Service: General;  Laterality: N/A;     OB History   No obstetric history on file.  Family History  Problem Relation Age of Onset   Hypertension Son     Social History   Tobacco Use   Smoking status: Never   Smokeless tobacco: Never  Vaping Use   Vaping Use: Never used  Substance Use Topics   Alcohol use: No   Drug use: No    Home Medications Prior to Admission medications   Medication Sig Start Date End Date Taking? Authorizing Provider  acebutolol (SECTRAL) 200 MG  capsule Take 1 capsule (200 mg total) by mouth daily as needed. 01/05/20   Baldo Daub, MD  albuterol (PROVENTIL HFA;VENTOLIN HFA) 108 (90 Base) MCG/ACT inhaler Inhale 1 puff into the lungs every 6 (six) hours as needed for wheezing or shortness of breath.    [provider]  albuterol (PROVENTIL) (2.5 MG/3ML) 0.083% nebulizer solution Take 1 vial by nebulization every 6 (six) hours as needed for wheezing or shortness of breath.  01/28/19   [provider]  amLODipine (NORVASC) 5 MG tablet Take 5 mg by mouth daily.    [provider]  Cyanocobalamin (VITAMIN B-12 IJ) Inject as directed every 30 (thirty) days.    [provider]  levothyroxine (SYNTHROID, LEVOTHROID) 25 MCG tablet Take 25 mcg by mouth daily before breakfast.    [provider]  montelukast (SINGULAIR) 10 MG tablet Take 10 mg by mouth daily. 03/26/19   [provider]  Multiple Vitamin (MULTIVITAMIN WITH MINERALS) TABS tablet Take 1 tablet by mouth daily.    [provider]  Vitamin D, Ergocalciferol, (DRISDOL) 50000 units CAPS capsule Take 50,000 Units by mouth every 7 (seven) days. Tuesday of each week    [provider]    Allergies    Fish allergy, Fish oil, Lisinopril, and Sulfamethoxazole-trimethoprim  Review of Systems   Review of Systems  Constitutional:  Negative for fever.  HENT:  Negative for sore throat.   Eyes:  Negative for visual disturbance.  Respiratory:  Negative for cough and shortness of breath.   Cardiovascular:  Negative for chest pain.  Gastrointestinal:  Positive for abdominal pain, constipation, nausea and vomiting. Negative for diarrhea, hematemesis and hematochezia.  Genitourinary:  Negative for dysuria and hematuria.  Musculoskeletal:  Negative for back pain.  Skin:  Negative for rash.  Neurological:  Negative for headaches.   Physical Exam Updated Vital Signs BP (!) 150/83 (BP Location: Left Arm)   Pulse 95   Temp 98.5  F (36.9 C) (Oral)   Resp 20   Ht 5\' 4"  (1.626 m)   Wt 63 kg   SpO2 98%   BMI 23.86 kg/m   Physical Exam Vitals and nursing note reviewed.  Constitutional:      General: She is not in acute distress.    Appearance: Normal appearance. She is well-developed.  HENT:     Head: Normocephalic and atraumatic.  Eyes:     Conjunctiva/sclera: Conjunctivae normal.  Cardiovascular:     Rate and Rhythm: Normal rate and regular rhythm.     Heart sounds: No murmur heard. Pulmonary:     Effort: Pulmonary effort is normal. No respiratory distress.     Breath sounds: Normal breath sounds.  Abdominal:     Palpations: Abdomen is soft. There is mass (llq). There is no pulsatile mass.     Tenderness: There is abdominal tenderness in the left lower quadrant.  Musculoskeletal:        General: No deformity or signs of injury. Normal range of motion.  Cervical back: Neck supple.  Skin:    General: Skin is warm and dry.  Neurological:     General: No focal deficit present.     Mental Status: She is alert.    ED Results / Procedures / Treatments   Labs (all labs ordered are listed, but only abnormal results are displayed) Labs Reviewed  COMPREHENSIVE METABOLIC PANEL - Abnormal; Notable for the following components:      Result Value   Glucose, Bld 136 (*)    Total Protein 8.8 (*)    All other components within normal limits  CBC WITH DIFFERENTIAL/PLATELET - Abnormal; Notable for the following components:   RBC 5.26 (*)    All other components within normal limits  URINALYSIS, ROUTINE W REFLEX MICROSCOPIC - Abnormal; Notable for the following components:   Ketones, ur 15 (*)    Protein, ur 30 (*)    Leukocytes,Ua SMALL (*)    All other components within normal limits  URINALYSIS, MICROSCOPIC (REFLEX) - Abnormal; Notable for the following components:   Bacteria, UA MANY (*)    All other components within normal limits  RESP PANEL BY RT-PCR (FLU A&B, COVID) ARPGX2  LIPASE, BLOOD   TROPONIN I (HIGH SENSITIVITY)  TROPONIN I (HIGH SENSITIVITY)    EKG EKG Interpretation  Date/Time:  Saturday September 09 2020 19:00:35 EDT Ventricular Rate:  88 PR Interval:  150 QRS Duration: 130 QT Interval:  398 QTC Calculation: 481 R Axis:   -72 Text Interpretation: Sinus rhythm with occasional Premature ventricular complexes Right bundle branch block Left anterior fascicular block Septal infarct , age undetermined Abnormal ECG No significant change since prior 9/21 Confirmed by Meridee Score [phone]) on 09/09/2020 7:08:46 PM  Radiology CT Abdomen Pelvis W Contrast  Result Date: 09/09/2020 CLINICAL DATA:  Diverticulitis suspected Patient reports abdominal pain and vomiting. EXAM: CT ABDOMEN AND PELVIS WITH CONTRAST TECHNIQUE: Multidetector CT imaging of the abdomen and pelvis was performed using the standard protocol following bolus administration of intravenous contrast. CONTRAST:  OMNIPAQUE IOHEXOL 300 MG/ML  SOLN COMPARISON:  CT 04/16/2019 FINDINGS: Lower chest: Minimal lingular and right middle lobe atelectasis. No pleural effusion. Upper normal heart size. Hepatobiliary: No focal hepatic lesion. Post cholecystectomy. Mild intrahepatic biliary ductal dilatation. Common bile duct measures 10 mm, normal for postcholecystectomy state. Pancreas: Parenchymal atrophy. There is progressive pancreatic ductal dilatation from prior exam, 6 mm in the pancreatic head. No acute peripancreatic inflammation. Small cyst in the pancreatic body measures 10 mm, unchanged from prior, series 10, image 25. There is no obvious obstructing pancreatic mass by CT. Spleen: Normal in size. There are few nonspecific low densities in the upper aspect of the spleen. Adrenals/Urinary Tract: Normal adrenal glands. No hydronephrosis. Multiple subcentimeter bilateral low-density renal cortical lesions are typically cysts. Symmetric renal excretion on delayed phase imaging. The urinary bladder is unremarkable.  Stomach/Bowel: The stomach is distended with intraluminal fluid. There may be mild gastric hyperemia and perigastric fat stranding. Dilated fluid-filled small bowel within the left abdomen there is a large left lower quadrant ventral abdominal wall/inguinal hernia containing loops of small bowel as well as colon. Small bowel within and proximal to the hernia are dilated and fluid-filled. No bowel pneumatosis. The exiting small bowel appears decompressed. There is a large right lower quadrant ventral abdominal wall/inguinal hernia containing small bowel and portions of colon, without obstruction. The appendix is located within this large hernia. There is diffuse colonic diverticulosis without focal diverticulitis. Vascular/Lymphatic: Aortic atherosclerosis and tortuosity. No portal venous or  mesenteric gas. Patent portal and mesenteric veins. No bulky abdominopelvic adenopathy. Reproductive: Uterus remains in situ, anteverted. No obvious adnexal mass. Other: Small volume of free fluid in the pelvis. No free air. Large bilateral lower ventral abdominal wall/inguinal hernias containing large and small bowel, causing obstruction on the left. Musculoskeletal: Multilevel degenerative change in the lumbar spine. Grade 1 anterolisthesis of L4 on L5 is likely facet mediated. There are no acute or suspicious osseous abnormalities. IMPRESSION: 1. Small bowel obstruction secondary to a left lower quadrant ventral abdominal wall/inguinal hernia. Hernia also contains noninflamed colon. 2. Large right ventral abdominal wall/inguinal hernia containing nonobstructed small bowel, colon, and appendix. 3. Progressive pancreatic ductal dilatation from prior exam, 6 mm in the pancreatic head. No obvious obstructing pancreatic mass by CT. Stable appearance of 10 mm pancreatic cyst in the body. Recommend further evaluation with pancreatic protocol MRI/MRCP on an elective basis to exclude the possibility of occult pancreatic head lesion. 4.  Colonic diverticulosis without diverticulitis. Aortic Atherosclerosis (ICD10-I70.0). Electronically Signed   By: Narda Rutherford M.D.   On: 09/09/2020 21:39   DG Abd Portable 1V  Result Date: 09/09/2020 CLINICAL DATA:  Check gastric catheter placement EXAM: PORTABLE ABDOMEN - 1 VIEW COMPARISON:  CT from earlier in the same day. FINDINGS: Gastric catheter is noted coiled within the stomach. IMPRESSION: Gastric catheter within the stomach. Electronically Signed   By: Alcide Clever M.D.   On: 09/09/2020 23:06    Procedures Hernia reduction  Date/Time: 09/10/2020 10:28 AM Performed by: Terrilee Files, MD Authorized by: Terrilee Files, MD  Consent: Verbal consent obtained. Consent given by: patient Patient understanding: patient states understanding of the procedure being performed Patient identity confirmed: verbally with patient Local anesthesia used: no  Anesthesia: Local anesthesia used: no  Sedation: Patient sedated: no  Patient tolerance: patient tolerated the procedure well with no immediate complications Comments: Attempted reduction of left inguinal hernia with partial success.  Patient tolerated procedure.     Medications Ordered in ED Medications  sodium chloride 0.9 % bolus 1,000 mL (has no administration in time range)  ondansetron (ZOFRAN) injection 4 mg (has no administration in time range)  morphine 4 MG/ML injection 4 mg (has no administration in time range)    ED Course  I have reviewed the triage vital signs and the nursing notes.  Pertinent labs & imaging results that were available during my care of the patient were reviewed by me and considered in my medical decision making (see chart for details).  Clinical Course as of 09/10/20 1027  Sat Sep 09, 2020  2155 Discussed with Dr. Magnus Ivan general surgery.  He asked that the patient could have an NG tube, admission to medical service and they will consult on the patient when she arrives.  He did not feel  that the patient needed antibiotics presently. [MB]  2158 I reviewed this with the patient and she is agreeable to plan. [MB]  2210 Dr. Magnus Ivan is recommending the patient be admitted to [facility] campus as Dr. Derrell Lolling is the surgeon on-call next week and he has seen the patient already. [MB]  2240 Discussed with Dr. Margo Aye who will put the patient in for a bed at [facility]. [MB]    Clinical Course User Index [MB] Terrilee Files, MD   MDM Rules/Calculators/A&P                          This patient complains of nausea and vomiting left  lower quadrant abdominal pain; this involves an extensive number of treatment Options and is a complaint that carries with it a high risk of complications and Morbidity. The differential includes diverticulitis, colitis, obstruction, incarcerated hernia, UTI, renal colic  I ordered, reviewed and interpreted labs, which included CBC with normal white count normal hemoglobin, chemistries and LFTs fairly unremarkable, urinalysis without clear signs of infection I ordered medication IV fluids pain medication nausea medication I ordered imaging studies which included CT abdomen pelvis and I independently    visualized and interpreted imaging which showed small bowel obstruction localized to left inguinal hernia Additional history obtained from patient's grandson Previous records obtained and reviewed in epic including prior surgical outpatient visit with Dr. Derrell Lolling I consulted general surgery Dr. Magnus Ivan and [facility] hospitalist Dr. Margo Aye and discussed lab and imaging findings  Critical Interventions: None  After the interventions stated above, I reevaluated the patient and found patient to be minimally symptomatic.  Hernia does not appear to be strangulated or gangrenous at this time.  Antibiotics not indicated per general surgery.  NG placed.  Will need admission to the hospital for further management and possible surgical correction.  Patient in agreement with plan.   Awaiting bed at [facility] for definitive management.   Final Clinical Impression(s) / ED Diagnoses Final diagnoses:  SBO (small bowel obstruction) (HCC)  Encounter for nasogastric (NG) tube placement  Pancreatic duct dilated  Bilateral inguinal hernia with obstruction and without gangrene, recurrence not specified    Rx / DC Orders ED Discharge Orders     None        Terrilee Files, MD 09/10/20 1031

## 2020-09-10 ENCOUNTER — Encounter (HOSPITAL_COMMUNITY): Payer: Self-pay | Admitting: Internal Medicine

## 2020-09-10 DIAGNOSIS — J4520 Mild intermittent asthma, uncomplicated: Secondary | ICD-10-CM | POA: Diagnosis not present

## 2020-09-10 DIAGNOSIS — E559 Vitamin D deficiency, unspecified: Secondary | ICD-10-CM | POA: Diagnosis present

## 2020-09-10 DIAGNOSIS — K436 Other and unspecified ventral hernia with obstruction, without gangrene: Secondary | ICD-10-CM

## 2020-09-10 DIAGNOSIS — Z20822 Contact with and (suspected) exposure to covid-19: Secondary | ICD-10-CM | POA: Diagnosis not present

## 2020-09-10 DIAGNOSIS — I361 Nonrheumatic tricuspid (valve) insufficiency: Secondary | ICD-10-CM | POA: Diagnosis not present

## 2020-09-10 DIAGNOSIS — Z91013 Allergy to seafood: Secondary | ICD-10-CM | POA: Diagnosis not present

## 2020-09-10 DIAGNOSIS — E876 Hypokalemia: Secondary | ICD-10-CM | POA: Diagnosis not present

## 2020-09-10 DIAGNOSIS — I493 Ventricular premature depolarization: Secondary | ICD-10-CM | POA: Diagnosis not present

## 2020-09-10 DIAGNOSIS — E039 Hypothyroidism, unspecified: Secondary | ICD-10-CM | POA: Diagnosis not present

## 2020-09-10 DIAGNOSIS — K8689 Other specified diseases of pancreas: Secondary | ICD-10-CM | POA: Diagnosis not present

## 2020-09-10 DIAGNOSIS — I2721 Secondary pulmonary arterial hypertension: Secondary | ICD-10-CM | POA: Diagnosis not present

## 2020-09-10 DIAGNOSIS — Z888 Allergy status to other drugs, medicaments and biological substances status: Secondary | ICD-10-CM | POA: Diagnosis not present

## 2020-09-10 DIAGNOSIS — Z79890 Hormone replacement therapy (postmenopausal): Secondary | ICD-10-CM | POA: Diagnosis not present

## 2020-09-10 DIAGNOSIS — R1032 Left lower quadrant pain: Secondary | ICD-10-CM | POA: Diagnosis present

## 2020-09-10 DIAGNOSIS — K219 Gastro-esophageal reflux disease without esophagitis: Secondary | ICD-10-CM | POA: Diagnosis present

## 2020-09-10 DIAGNOSIS — K4000 Bilateral inguinal hernia, with obstruction, without gangrene, not specified as recurrent: Secondary | ICD-10-CM | POA: Diagnosis not present

## 2020-09-10 DIAGNOSIS — E785 Hyperlipidemia, unspecified: Secondary | ICD-10-CM | POA: Diagnosis not present

## 2020-09-10 DIAGNOSIS — Z79899 Other long term (current) drug therapy: Secondary | ICD-10-CM | POA: Diagnosis not present

## 2020-09-10 DIAGNOSIS — Z882 Allergy status to sulfonamides status: Secondary | ICD-10-CM | POA: Diagnosis not present

## 2020-09-10 DIAGNOSIS — I10 Essential (primary) hypertension: Secondary | ICD-10-CM | POA: Diagnosis not present

## 2020-09-10 DIAGNOSIS — I351 Nonrheumatic aortic (valve) insufficiency: Secondary | ICD-10-CM | POA: Diagnosis not present

## 2020-09-10 DIAGNOSIS — Z8673 Personal history of transient ischemic attack (TIA), and cerebral infarction without residual deficits: Secondary | ICD-10-CM | POA: Diagnosis not present

## 2020-09-10 DIAGNOSIS — G4733 Obstructive sleep apnea (adult) (pediatric): Secondary | ICD-10-CM | POA: Diagnosis not present

## 2020-09-10 DIAGNOSIS — R000 Tachycardia, unspecified: Secondary | ICD-10-CM | POA: Diagnosis not present

## 2020-09-10 DIAGNOSIS — Z8249 Family history of ischemic heart disease and other diseases of the circulatory system: Secondary | ICD-10-CM | POA: Diagnosis not present

## 2020-09-10 MED ORDER — ONDANSETRON HCL 4 MG/2ML IJ SOLN: 4.0000 mg | Freq: Four times a day (QID) | INTRAMUSCULAR | Status: DC | PRN

## 2020-09-10 MED ORDER — ONDANSETRON 4 MG PO TBDP: 4.0000 mg | ORAL_TABLET | Freq: Four times a day (QID) | ORAL | Status: DC | PRN

## 2020-09-10 MED ORDER — ALBUTEROL SULFATE (2.5 MG/3ML) 0.083% IN NEBU: 3.0000 mL | INHALATION_SOLUTION | Freq: Four times a day (QID) | RESPIRATORY_TRACT | Status: DC | PRN

## 2020-09-10 MED ORDER — HYDRALAZINE HCL 20 MG/ML IJ SOLN: 10.0000 mg | INTRAMUSCULAR | Status: DC | PRN

## 2020-09-10 MED ORDER — LACTATED RINGERS IV SOLN: INTRAVENOUS | Status: DC

## 2020-09-10 MED ORDER — ACETAMINOPHEN 650 MG RE SUPP: 650.0000 mg | Freq: Four times a day (QID) | RECTAL | Status: DC | PRN

## 2020-09-10 MED ORDER — ACETAMINOPHEN 325 MG PO TABS
650.0000 mg | ORAL_TABLET | Freq: Four times a day (QID) | ORAL | Status: DC | PRN
  Administered 2020-09-11 – 2020-09-12 (×2): 650 mg via ORAL
  Filled 2020-09-10 (×2): qty 2

## 2020-09-10 NOTE — ED Notes (Signed)
ED TO INPATIENT HANDOFF REPORT  ED Nurse Name and Phone #: [phone]  S Name/Age/Gender Dominique Ochoa 80 y.o. female Room/Bed: MH07/MH07  Code Status   Code Status: Prior  Home/SNF/Other [name] [phone] Patient oriented to: self Is this baseline? Yes   Triage Complete: Triage complete  Chief Complaint SBO (small bowel obstruction) (HCC) [K56.609]  Triage Note Pt c/o abd pain and emesis that started yesterday. Pt has had 2 episodes of emesis today. Pt also endorses frequent belching.     Allergies Allergies  Allergen Reactions  . Fish Allergy Other (See Comments)    Makes her weak  . Fish Oil Other (See Comments) and Swelling    MD orders  . Lisinopril Swelling    Facial swelling  . Sulfamethoxazole-Trimethoprim Itching and Swelling    Facial swelling    Level of Care/Admitting Diagnosis ED Disposition    ED Disposition  Admit   Condition  --   Comment  Hospital Area: [facility] [[id number]]  Level of Care: Telemetry Surgical [[id number]]  May admit patient to [facility] or [facility] if equivalent level of care is available:: No  Interfacility transfer: Yes  Covid Evaluation: Asymptomatic Screening Protocol (No Symptoms)  Diagnosis: SBO (small bowel obstruction) [facility]) [[id number]]  Admitting Physician: Darlin Drop [[id number]]  Attending Physician: Darlin Drop [[id number]]  Estimated length of stay: past midnight tomorrow  Certification:: I certify this patient will need inpatient services for at least 2 midnights         B Medical/Surgery History Past Medical History:  Diagnosis Date  . Abnormal finding on chest xray 09/16/2015  . Acute cholecystitis 04/03/2018  . Asthma   . B12 deficiency 02/03/2018  . Bowel obstruction (HCC) 04/16/2019  . 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 Elevated troponin 04/03/2018  . Gallstone 04/03/2018  . GERD (gastroesophageal reflux disease)   . HTN (hypertension) 09/16/2015  . Hyperglycemia 09/16/2015   . Hyperlipidemia 11/16/2019  . Hypertension   . Hypothyroidism 09/16/2015  . Injury of right toe, sequela 04/20/2017  . Mild intermittent asthma without complication 11/16/2019  . Near syncope 04/20/2017  . Normocytic anemia 04/20/2017  . OSA (obstructive sleep apnea) 01/24/2017   Formatting of this note might be different from the original. Sx cleared after wt loss  . Pain in the chest 09/16/2015  . PSVT (paroxysmal supraventricular tachycardia) (HCC) 05/22/2016  . Sepsis (HCC) 04/03/2018  . Thyroid disease   . Ventral hernia with bowel obstruction 04/16/2019  . Vitamin D deficiency disease 11/16/2019   Past Surgical History:  Procedure Laterality Date  . CHOLECYSTECTOMY N/A 04/04/2018   Procedure: LAPAROSCOPIC CHOLECYSTECTOMY WITH INTRAOPERATIVE CHOLANGIOGRAM;  Surgeon: Ovidio Kin, MD;  Location: [facility];  Service: General;  Laterality: N/A;     A IV Location/Drains/Wounds Patient Lines/Drains/Airways Status    Active Line/Drains/Airways    Name Placement date Placement time Site Days   Peripheral IV 09/09/20 20 G Left Antecubital 09/09/20  1924  Antecubital  1   NG/OG Vented/Dual Lumen Nasogastric 14 Fr. Right nare External length of tube 09/09/20  2226  Right nare  1          Intake/Output Last 24 hours  Intake/Output Summary (Last 24 hours) at 09/10/2020 0942 Last data filed at 09/10/2020 8099 Gross per 24 hour  Intake 2000.73 ml  Output 150 ml  Net 1850.73 ml    Labs/Imaging Results for orders placed or performed during the hospital encounter of 09/09/20 (from the past  48 hour(s))  Comprehensive metabolic panel     Status: Abnormal   Collection Time: 09/09/20  7:25 PM  Result Value Ref Range   Sodium 138 135 - 145 mmol/L   Potassium 3.6 3.5 - 5.1 mmol/L   Chloride 100 98 - 111 mmol/L   CO2 26 22 - 32 mmol/L   Glucose, Bld 136 (H) 70 - 99 mg/dL    Comment: Glucose reference range applies only to samples taken after fasting for at least 8 hours.   BUN 18 8 - 23 mg/dL    Creatinine, Ser 1.61 0.44 - 1.00 mg/dL   Calcium 9.6 8.9 - 09.6 mg/dL   Total Protein 8.8 (H) 6.5 - 8.1 g/dL   Albumin 4.2 3.5 - 5.0 g/dL   AST 32 15 - 41 U/L   ALT 22 0 - 44 U/L   Alkaline Phosphatase 74 38 - 126 U/L   Total Bilirubin 0.7 0.3 - 1.2 mg/dL   GFR, Estimated >04 >54 mL/min    Comment: (NOTE) Calculated using the CKD-EPI Creatinine Equation (2021)    Anion gap 12 5 - 15    Comment: Performed at [facility], [address] [place], [place] [address]  Lipase, blood     Status: None   Collection Time: 09/09/20  7:25 PM  Result Value Ref Range   Lipase 39 11 - 51 U/L    Comment: Performed at [facility], [address] [place], [place] [address]  CBC with Differential     Status: Abnormal   Collection Time: 09/09/20  7:25 PM  Result Value Ref Range   WBC 7.4 4.0 - 10.5 K/uL   RBC 5.26 (H) 3.87 - 5.11 MIL/uL   Hemoglobin 14.3 12.0 - 15.0 g/dL   HCT 29.5 62.1 - 30.8 %   MCV 83.5 80.0 - 100.0 fL   MCH 27.2 26.0 - 34.0 pg   MCHC 32.6 30.0 - 36.0 g/dL   RDW 65.7 84.6 - 96.2 %   Platelets 291 150 - 400 K/uL   nRBC 0.0 0.0 - 0.2 %   Neutrophils Relative % 76 %   Neutro Abs 5.6 1.7 - 7.7 K/uL   Lymphocytes Relative 17 %   Lymphs Abs 1.3 0.7 - 4.0 K/uL   Monocytes Relative 6 %   Monocytes Absolute 0.5 0.1 - 1.0 K/uL   Eosinophils Relative 0 %   Eosinophils Absolute 0.0 0.0 - 0.5 K/uL   Basophils Relative 1 %   Basophils Absolute 0.1 0.0 - 0.1 K/uL   Immature Granulocytes 0 %   Abs Immature Granulocytes 0.02 0.00 - 0.07 K/uL    Comment: Performed at [facility], [address] [place], [place] [address]  Urinalysis, Routine w reflex microscopic Urine, Clean Catch     Status: Abnormal   Collection Time: 09/09/20  7:25 PM  Result Value Ref Range   Color, Urine YELLOW YELLOW   APPearance CLEAR CLEAR   Specific Gravity, Urine 1.020 1.005 - 1.030   pH 7.0 5.0 - 8.0   Glucose, UA NEGATIVE NEGATIVE mg/dL   Hgb urine dipstick  NEGATIVE NEGATIVE   Bilirubin Urine NEGATIVE NEGATIVE   Ketones, ur 15 (A) NEGATIVE mg/dL   Protein, ur 30 (A) NEGATIVE mg/dL   Nitrite NEGATIVE NEGATIVE   Leukocytes,Ua SMALL (A) NEGATIVE    Comment: Performed at [facility], [address] [place], [place] [address]  Troponin I (High Sensitivity)  Status: None   Collection Time: 09/09/20  7:25 PM  Result Value Ref Range   Troponin I (High Sensitivity) 12 <18 ng/L    Comment: (NOTE) Elevated high sensitivity troponin I (hsTnI) values and significant  changes across serial measurements may suggest ACS but many other  chronic and acute conditions are known to elevate hsTnI results.  Refer to the "Links" section for chest pain algorithms and additional  guidance. Performed at [facility] [place], [address] [place], [place] [address]   Urinalysis, Microscopic (reflex)     Status: Abnormal   Collection Time: 09/09/20  7:25 PM  Result Value Ref Range   RBC / HPF 0-5 0 - 5 RBC/hpf   WBC, UA 6-10 0 - 5 WBC/hpf   Bacteria, UA MANY (A) NONE SEEN   Squamous Epithelial / LPF 0-5 0 - 5    Comment: Performed at [facility] [place], [address] [place], [place] [address]  Troponin I (High Sensitivity)     Status: None   Collection Time: 09/09/20 10:28 PM  Result Value Ref Range   Troponin I (High Sensitivity) 15 <18 ng/L    Comment: (NOTE) Elevated high sensitivity troponin I (hsTnI) values and significant  changes across serial measurements may suggest ACS but many other  chronic and acute conditions are known to elevate hsTnI results.  Refer to the "Links" section for chest pain algorithms and additional  guidance. Performed at [facility] [place], [address] [place], [place] [address]   Resp Panel by RT-PCR (Flu A&B, Covid) Nasopharyngeal Swab     Status: None   Collection Time: 09/09/20 10:28 PM   Specimen: Nasopharyngeal Swab; Nasopharyngeal(NP) swabs in vial transport medium  Result Value  Ref Range   SARS Coronavirus 2 by RT PCR NEGATIVE NEGATIVE    Comment: (NOTE) SARS-CoV-2 target nucleic acids are NOT DETECTED.  The SARS-CoV-2 RNA is generally detectable in upper respiratory specimens during the acute phase of infection. The lowest concentration of SARS-CoV-2 viral copies this assay can detect is 138 copies/mL. A negative result does not preclude SARS-Cov-2 infection and should not be used as the sole basis for treatment or other patient management decisions. A negative result may occur with  improper specimen collection/handling, submission of specimen other than nasopharyngeal swab, presence of viral mutation(s) within the areas targeted by this assay, and inadequate number of viral copies(<138 copies/mL). A negative result must be combined with clinical observations, patient history, and epidemiological information. The expected result is Negative.  Fact Sheet for Patients:  BloggerCourse.comhttps://www.fda.gov/media/152166/download  Fact Sheet for Healthcare Providers:  SeriousBroker.ithttps://www.fda.gov/media/152162/download  This test is no t yet approved or cleared by the [place] FDA and  has been authorized for detection and/or diagnosis of SARS-CoV-2 by FDA under an Emergency Use Authorization (EUA). This EUA will remain  in effect (meaning this test can be used) for the duration of the COVID-19 declaration under Section 564(b)(1) of the Act, 21 U.S.C.section 360bbb-3(b)(1), unless the authorization is terminated  or revoked sooner.       Influenza A by PCR NEGATIVE NEGATIVE   Influenza B by PCR NEGATIVE NEGATIVE    Comment: (NOTE) The Xpert Xpress SARS-CoV-2/FLU/RSV plus assay is intended as an aid in the diagnosis of influenza from Nasopharyngeal swab specimens and should not be used as a sole basis for treatment. Nasal washings and aspirates are unacceptable for Xpert Xpress SARS-CoV-2/FLU/RSV testing.  Fact Sheet for  Patients: BloggerCourse.comhttps://www.fda.gov/media/152166/download  Fact Sheet for  Healthcare Providers: SeriousBroker.it  This test is not yet approved or cleared by the [place] and has been authorized for detection and/or diagnosis of SARS-CoV-2 by FDA under an Emergency Use Authorization (EUA). This EUA will remain in effect (meaning this test can be used) for the duration of the COVID-19 declaration under Section 564(b)(1) of the Act, 21 U.S.C. section 360bbb-3(b)(1), unless the authorization is terminated or revoked.  Performed at [facility], [address], [place], [place] [address]    CT Abdomen Pelvis W Contrast  Result Date: 09/09/2020 CLINICAL DATA:  Diverticulitis suspected Patient reports abdominal pain and vomiting. EXAM: CT ABDOMEN AND PELVIS WITH CONTRAST TECHNIQUE: Multidetector CT imaging of the abdomen and pelvis was performed using the standard protocol following bolus administration of intravenous contrast. CONTRAST:  OMNIPAQUE IOHEXOL 300 MG/ML  SOLN COMPARISON:  CT 04/16/2019 FINDINGS: Lower chest: Minimal lingular and right middle lobe atelectasis. No pleural effusion. Upper normal heart size. Hepatobiliary: No focal hepatic lesion. Post cholecystectomy. Mild intrahepatic biliary ductal dilatation. Common bile duct measures 10 mm, normal for postcholecystectomy state. Pancreas: Parenchymal atrophy. There is progressive pancreatic ductal dilatation from prior exam, 6 mm in the pancreatic head. No acute peripancreatic inflammation. Small cyst in the pancreatic body measures 10 mm, unchanged from prior, series 10, image 25. There is no obvious obstructing pancreatic mass by CT. Spleen: Normal in size. There are few nonspecific low densities in the upper aspect of the spleen. Adrenals/Urinary Tract: Normal adrenal glands. No hydronephrosis. Multiple subcentimeter bilateral low-density renal cortical lesions are typically cysts. Symmetric  renal excretion on delayed phase imaging. The urinary bladder is unremarkable. Stomach/Bowel: The stomach is distended with intraluminal fluid. There may be mild gastric hyperemia and perigastric fat stranding. Dilated fluid-filled small bowel within the left abdomen there is a large left lower quadrant ventral abdominal wall/inguinal hernia containing loops of small bowel as well as colon. Small bowel within and proximal to the hernia are dilated and fluid-filled. No bowel pneumatosis. The exiting small bowel appears decompressed. There is a large right lower quadrant ventral abdominal wall/inguinal hernia containing small bowel and portions of colon, without obstruction. The appendix is located within this large hernia. There is diffuse colonic diverticulosis without focal diverticulitis. Vascular/Lymphatic: Aortic atherosclerosis and tortuosity. No portal venous or mesenteric gas. Patent portal and mesenteric veins. No bulky abdominopelvic adenopathy. Reproductive: Uterus remains in situ, anteverted. No obvious adnexal mass. Other: Small volume of free fluid in the pelvis. No free air. Large bilateral lower ventral abdominal wall/inguinal hernias containing large and small bowel, causing obstruction on the left. Musculoskeletal: Multilevel degenerative change in the lumbar spine. Grade 1 anterolisthesis of L4 on L5 is likely facet mediated. There are no acute or suspicious osseous abnormalities. IMPRESSION: 1. Small bowel obstruction secondary to a left lower quadrant ventral abdominal wall/inguinal hernia. Hernia also contains noninflamed colon. 2. Large right ventral abdominal wall/inguinal hernia containing nonobstructed small bowel, colon, and appendix. 3. Progressive pancreatic ductal dilatation from prior exam, 6 mm in the pancreatic head. No obvious obstructing pancreatic mass by CT. Stable appearance of 10 mm pancreatic cyst in the body. Recommend further evaluation with pancreatic protocol MRI/MRCP on an  elective basis to exclude the possibility of occult pancreatic head lesion. 4. Colonic diverticulosis without diverticulitis. Aortic Atherosclerosis (ICD10-I70.0). Electronically Signed   By: Narda Rutherford M.D.   On: 09/09/2020 21:39   DG Abd Portable 1V  Result Date: 09/09/2020 CLINICAL DATA:  Check gastric catheter placement EXAM: PORTABLE ABDOMEN -  1 VIEW COMPARISON:  CT from earlier in the same day. FINDINGS: Gastric catheter is noted coiled within the stomach. IMPRESSION: Gastric catheter within the stomach. Electronically Signed   By: Alcide Clever M.D.   On: 09/09/2020 23:06    Pending Labs Unresulted Labs (From admission, onward)   None      Vitals/Pain Today's Vitals   09/10/20 0400 09/10/20 0500 09/10/20 0600 09/10/20 0700  BP: (!) 146/68 (!) 151/97 (!) 158/83 (!) 156/85  Pulse:   (!) 47 (!) 48  Resp: 19 18 (!) 23 (!) 23  Temp:      TempSrc:      SpO2: 96% 96% 95% 95%  Weight:      Height:      PainSc: Asleep 0-No pain 0-No pain 0-No pain    Isolation Precautions Airborne and Contact precautions  Medications Medications  pentafluoroprop-tetrafluoroeth (GEBAUERS) aerosol (30 application Topical Given 09/09/20 2222)  morphine 2 MG/ML injection 2 mg (has no administration in time range)  sodium chloride 0.9 % bolus 1,000 mL (0 mLs Intravenous Stopped 09/09/20 2118)  ondansetron (ZOFRAN) injection 4 mg (4 mg Intravenous Given 09/09/20 1940)  morphine 4 MG/ML injection 4 mg (2 mg Intravenous Given 09/09/20 1942)  iohexol (OMNIPAQUE) 300 MG/ML solution 100 mL (100 mLs Intravenous Contrast Given 09/09/20 2039)  0.9 %  sodium chloride infusion ( Intravenous Stopped 09/10/20 1941)    Mobility walks Low fall risk   Focused Assessments Neuro Assessment Handoff:  Swallow screen pass? Yes          Neuro Assessment: Exceptions to WDL Neuro Checks:      Last Documented NIHSS Modified Score:   Has TPA been given? NO If patient is a Neuro Trauma and patient is going to  OR before floor call report to 4N Charge nurse: [phone] or [phone]     R Recommendations: See Admitting Provider Note  Report given to:   Additional Notes:

## 2020-09-10 NOTE — ED Notes (Signed)
Up to void about every hour. Ambulates to bedside camode.

## 2020-09-10 NOTE — ED Notes (Signed)
Report attempted x2 no answer will retry.

## 2020-09-10 NOTE — Consult Note (Signed)
80 year old female, who comes in with a history of hypothyroidism, asthma, chronic kidney disease, and bilateral inguinal hernias. She was seen by Dr Derrell Lolling and was being scheduled for hernia repair.   Per his note: "Patient previously under went consultation in April 2021. Patient had a CT scan which I reviewed personally. Patient had bilateral inguinal hernias that were large and extended up to her flanks bilaterally. Patient states that she is continued with what sounds to be some pain and discomfort with bloating. She states that the hernias can flatten out somewhat however she does still have hernias present. Patient states that she feels discomfort when laying down in laying on 1 side or the other."  She presented to outside ER with llq pain, nausea and vomiting.  She underwent ct that showed a sbo.  By report she had hernias reduced and ng placed. She feels better. No real complaints when I see her.  She was transferred here for evaluation.    Review of Systems  Constitutional: Negative for fever.  HENT: Negative for congestion.  Eyes: Negative for blurred vision.  Respiratory: Negative for cough, shortness of breath and wheezing.  Cardiovascular: Negative for chest pain and palpitations.  Gastrointestinal: Negative for heartburn.  Genitourinary: Negative for dysuria.  Musculoskeletal: Negative for myalgias.  Skin: Negative for rash.  Neurological: Negative for dizziness and headaches.  Psychiatric/Behavioral: Negative for depression and suicidal ideas.  All other systems reviewed and are negative.   Medical History: Diagnosis Date   Asthma, unspecified asthma severity, unspecified whether complicated, unspecified whether persistent   GERD (gastroesophageal reflux disease)   Hypertension   Thyroid disease   Allergies  Allergen Reactions   Fish Oil Other (See Comments) and Swelling  MD orders   Lisinopril Swelling  Facial swelling Facial swelling    Sulfamethoxazole-Trimethoprim Itching and Swelling  Facial swelling Facial swelling   Current Outpatient Medications on File Prior to Visit  Medication Sig Dispense Refill   albuterol (ACCUNEB) 0.63 mg/3 mL nebulizer solution Inhale 0.63 mg into the lungs every 6 (six) hours as needed   amLODIPine (NORVASC) 5 MG tablet Take 5 mg by mouth once daily   ergocalciferol, vitamin D2, 1,250 mcg (50,000 unit) capsule Take 1 capsule by mouth once a week   levothyroxine (SYNTHROID) 25 MCG tablet TAKE 1 TABLET(25 MCG) BY MOUTH DAILY AT 6 AM   montelukast (SINGULAIR) 10 mg tablet Take 10 mg by mouth at bedtime    History reviewed. No pertinent family history.   Social History   Tobacco Use  Smoking Status Never Smoker  Smokeless Tobacco Never Used    Social History   Socioeconomic History   Marital status: Single  Tobacco Use   Smoking status: Never Smoker   Smokeless tobacco: Never Used  Building services engineer Use: Never used  Substance and Sexual Activity   Alcohol use: Never   Drug use: Never   Objective:   Body mass index is 23.26 kg/m.  Physical Exam Constitutional:  Appearance: Normal appearance.   No scleral icterus. Cardiovascular: RRR Pulmonary:  No respiratory distress. Normal breath sounds.  Abdominal: Abdomen is flat.  Hernia: A hernia is present. Hernia is present in the left inguinal area and right inguinal area.   Bilateral hernias that extent cephalad to bilateral flanks. These are not really easily reducible now, nontender though Skin is warm.  Neurological:  General: No focal deficit present.  Mental Status: She is alert and oriented to person, place, and time.  Psychiatric:  Mood and Affect: Mood normal.  Thought Content: Thought content normal.    Assessment and Plan:   Bilateral recurrent inguinal hernia with likely SBO related to left side  -continue NG tube -follow up EKG, appreciate Dr Ophelia Charter assistance -npo -likely surgery tomorrow with Dr  Derrell Lolling

## 2020-09-10 NOTE — ED Notes (Signed)
Report attempted again x2, no answer. [facility] at bedside and aware report unable to be given. Will attempt again.

## 2020-09-10 NOTE — H&P (Signed)
History and Physical    Dominique Momadine Ingram Kawamoto UJW:[id number] DOB: May 06, 1940 DOA: 09/09/2020  PCP: Redmond SchoolKilby, Angela C, NP Consultants:  [facility]irez - surgery; [facility] Patient coming from:  Home - lives with son and his wife and son; NOK: Fabio BeringSon, Gerald Sublett, [phone]   Chief Complaint: Abdominal pain  HPI: Dominique Ochoa is a 80 y.o. female with medical history significant of ventral hernia with SBO (3/21); HTN; HLD; hypothyroidism; and OSA presenting with abdominal pain.  She was seen by Dr. Derrell Lollingamirez on 8/10 for B recurrent inguinal hernia without obstruction and was planned for robotic B inguinal hernia repair with mesh.  She has not received a surgery date, does not want an ostomy.  After the visit, she noticed her bowels weren't moving good and she had n/v.  Friday she noticed no BM and she developed severe abdominal pain.  Last emesis was yesterday afternoon.  Her abdominal pain is improved, she does feel some acid, is belching a lot.  The NG tube is uncomfortable.    [facility], per Dr. Margo AyeHall:  80 year old female with history of bilateral recurrent inguinal hernia, followed by Dr. Derrell Lollingamirez with plan for repair (office visit 09/06/20), hypothyroidism, hypertension, asthma, GERD, RBBB, moderate pulmonary artery hypertension, atrial septal aneurysm, small PFO, postcholecystectomy, who [facility] with complaints of left-sided abdominal pain associated with nausea and vomiting inability to keep anything down.  CT scan showing SBO secondary to a left lower quadrant ventral abdominal wall/inguinal hernia.  Hernia also contained noninflamed colon.  NG tube placed.  ED EDP discussed case with general surgery Dr. Magnus IvanBlackman.  Since [facility]'s [facility] for further management of SBO.  Laboratory evaluation reassuring.  Vital signs stable.  Patient [facility] as  inpatient status.  Please contact Dr. Derrell Lollingamirez once the [facility].  Review of Systems: As per HPI; otherwise review of systems reviewed and negative.   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 Sx cleared after wt loss   PSVT (paroxysmal supraventricular tachycardia) (HCC) 05/22/2016   Sepsis (HCC) 04/03/2018   Ventral hernia with bowel obstruction 04/16/2019   Vitamin D deficiency disease 11/16/2019    Past Surgical History:  Procedure Laterality Date   CHOLECYSTECTOMY N/A 04/04/2018   Procedure: LAPAROSCOPIC CHOLECYSTECTOMY WITH INTRAOPERATIVE CHOLANGIOGRAM;  Surgeon: Ovidio KinNewman, David, MD;  Location: [facility] ORS;  Service: General;  Laterality: N/A;    Social History   Socioeconomic History   Marital status: Widowed    Spouse name: Not on file   Number of children: Not on file   Years of education: Not on file   Highest education level: Not on file  Occupational History   Occupation: retired  Tobacco Use   Smoking status: Never   Smokeless tobacco: Never  Vaping Use   Vaping Use: Never used  Substance and Sexual Activity  Alcohol use: No   Drug use: No   Sexual activity: Not on file  Other Topics Concern   Not on file  Social History Narrative   Not on file   Social Determinants of Health   Financial Resource Strain: Not on file  Food Insecurity: Not on file  Transportation Needs: Not on file   Physical Activity: Not on file  Stress: Not on file  Social Connections: Not on file  Intimate Partner Violence: Not on file    Allergies  Allergen Reactions   Fish Allergy Other (See Comments)    Makes her weak   Fish Oil Other (See Comments) and Swelling    MD orders   Lisinopril Swelling    Facial swelling   Sulfamethoxazole-Trimethoprim Itching and Swelling    Facial swelling    Family History  Problem Relation Age of Onset   Hypertension Son     Prior to Admission medications   Medication Sig Start Date End Date Taking? Authorizing Provider  acebutolol (SECTRAL) 200 MG capsule Take 1 capsule (200 mg total) by mouth daily as needed. 01/05/20   Baldo Daub, MD  albuterol (PROVENTIL HFA;VENTOLIN HFA) 108 (90 Base) MCG/ACT inhaler Inhale 1 puff into the lungs every 6 (six) hours as needed for wheezing or shortness of breath.    [provider]  albuterol (PROVENTIL) (2.5 MG/3ML) 0.083% nebulizer solution Take 1 vial by nebulization every 6 (six) hours as needed for wheezing or shortness of breath.  01/28/19   [provider]  amLODipine (NORVASC) 5 MG tablet Take 5 mg by mouth daily.    [provider]  Cyanocobalamin (VITAMIN B-12 IJ) Inject as directed every 30 (thirty) days.    [provider]  levothyroxine (SYNTHROID, LEVOTHROID) 25 MCG tablet Take 25 mcg by mouth daily before breakfast.    [provider]  montelukast (SINGULAIR) 10 MG tablet Take 10 mg by mouth daily. 03/26/19   [provider]  Multiple Vitamin (MULTIVITAMIN WITH MINERALS) TABS tablet Take 1 tablet by mouth daily.    [provider]  Vitamin D, Ergocalciferol, (DRISDOL) 50000 units CAPS capsule Take 50,000 Units by mouth every 7 (seven) days. Tuesday of each week    [provider]    Physical Exam: Vitals:   09/10/20 0700 09/10/20 0900 09/10/20 1111 09/10/20 1115  BP: (!) 156/85 (!) 142/71 (!) 144/86   Pulse: (!) 48 (!) 47 90    Resp: (!) 23 18 (!) 24   Temp:   97.8 F (36.6 C)   TempSrc:   Oral   SpO2: 95% 93% 95%   Weight:    60.1 kg  Height:    5\' 4"  (1.626 m)     General:  Appears calm and uncomfortable with NG tube in place Eyes:  EOMI, normal lids, iris ENT:  grossly normal hearing, lips & tongue, NG tube in place Neck:  no LAD, masses or thyromegaly Cardiovascular:  RRR with periodic missed beats and bigeminy apparent, no m/r/g. 1+ LE edema.  Respiratory:   CTA bilaterally with no wheezes/rales/rhonchi.  Normal respiratory effort. Abdomen:  NT, ND, very hypoactive BS, large LLQ fullness that appears to be related to hernia Skin:  no rash or induration seen on limited exam Musculoskeletal:  grossly normal tone BUE/BLE, good ROM, no bony abnormality Psychiatric: blunted mood and affect, speech fluent and appropriate, AOx3 Neurologic:  CN 2-12 grossly intact, moves all extremities in coordinated fashion    Radiological Exams on Admission: Independently  reviewed - see discussion in A/P where applicable  CT Abdomen Pelvis W Contrast  Result Date: 09/09/2020 CLINICAL DATA:  Diverticulitis suspected Patient reports abdominal pain and vomiting. EXAM: CT ABDOMEN AND PELVIS WITH CONTRAST TECHNIQUE: Multidetector CT imaging of the abdomen and pelvis was performed using the standard protocol following bolus administration of intravenous contrast. CONTRAST:  OMNIPAQUE IOHEXOL 300 MG/ML  SOLN COMPARISON:  CT 04/16/2019 FINDINGS: Lower chest: Minimal lingular and right middle lobe atelectasis. No pleural effusion. Upper normal heart size. Hepatobiliary: No focal hepatic lesion. Post cholecystectomy. Mild intrahepatic biliary ductal dilatation. Common bile duct measures 10 mm, normal for postcholecystectomy state. Pancreas: Parenchymal atrophy. There is progressive pancreatic ductal dilatation from prior exam, 6 mm in the pancreatic head. No acute peripancreatic inflammation. Small cyst in the pancreatic body  measures 10 mm, unchanged from prior, series 10, image 25. There is no obvious obstructing pancreatic mass by CT. Spleen: Normal in size. There are few nonspecific low densities in the upper aspect of the spleen. Adrenals/Urinary Tract: Normal adrenal glands. No hydronephrosis. Multiple subcentimeter bilateral low-density renal cortical lesions are typically cysts. Symmetric renal excretion on delayed phase imaging. The urinary bladder is unremarkable. Stomach/Bowel: The stomach is distended with intraluminal fluid. There may be mild gastric hyperemia and perigastric fat stranding. Dilated fluid-filled small bowel within the left abdomen there is a large left lower quadrant ventral abdominal wall/inguinal hernia containing loops of small bowel as well as colon. Small bowel within and proximal to the hernia are dilated and fluid-filled. No bowel pneumatosis. The exiting small bowel appears decompressed. There is a large right lower quadrant ventral abdominal wall/inguinal hernia containing small bowel and portions of colon, without obstruction. The appendix is located within this large hernia. There is diffuse colonic diverticulosis without focal diverticulitis. Vascular/Lymphatic: Aortic atherosclerosis and tortuosity. No portal venous or mesenteric gas. Patent portal and mesenteric veins. No bulky abdominopelvic adenopathy. Reproductive: Uterus remains in situ, anteverted. No obvious adnexal mass. Other: Small volume of free fluid in the pelvis. No free air. Large bilateral lower ventral abdominal wall/inguinal hernias containing large and small bowel, causing obstruction on the left. Musculoskeletal: Multilevel degenerative change in the lumbar spine. Grade 1 anterolisthesis of L4 on L5 is likely facet mediated. There are no acute or suspicious osseous abnormalities. IMPRESSION: 1. Small bowel obstruction secondary to a left lower quadrant ventral abdominal wall/inguinal hernia. Hernia also contains noninflamed  colon. 2. Large right ventral abdominal wall/inguinal hernia containing nonobstructed small bowel, colon, and appendix. 3. Progressive pancreatic ductal dilatation from prior exam, 6 mm in the pancreatic head. No obvious obstructing pancreatic mass by CT. Stable appearance of 10 mm pancreatic cyst in the body. Recommend further evaluation with pancreatic protocol MRI/MRCP on an elective basis to exclude the possibility of occult pancreatic head lesion. 4. Colonic diverticulosis without diverticulitis. Aortic Atherosclerosis (ICD10-I70.0). Electronically Signed   By: Narda Rutherford M.D.   On: 09/09/2020 21:39   DG Abd Portable 1V  Result Date: 09/09/2020 CLINICAL DATA:  Check gastric catheter placement EXAM: PORTABLE ABDOMEN - 1 VIEW COMPARISON:  CT from earlier in the same day. FINDINGS: Gastric catheter is noted coiled within the stomach. IMPRESSION: Gastric catheter within the stomach. Electronically Signed   By: Alcide Clever M.D.   On: 09/09/2020 23:06    EKG: Independently reviewed.  NSR with rate 88; LAFB; nonspecific ST changes with NSCSLT   Labs on Admission: I have personally reviewed the available labs and imaging studies at the time of the admission.  Pertinent labs:   Glucose 136 HS troponin 12, 15 Unremarkable CBC COVID/flu negative UA: 15 ketones, small LE, 30 protein, many bacteria   Assessment/Plan Principal Problem:   Ventral hernia with bowel obstruction Active Problems:   Essential hypertension   Hypothyroidism   Asthma   SBO/hernias -Patient with prior h/o cholecystectomy and known large ventral/inguinal hernias recently assessed for elective repair presenting with acute onset of abdominal pain with n/v, abdominal distention, and CT findings c/w SBO -Will admit to telemetry (see below) -Gen Surg consulted by ER and will see; likely to have operative repair with Dr. Derrell Lolling tomorrow -NPO for bowel rest -NG tube in place -IVF hydration -Pain control with  morphine  Pancreatic head ductal dilatation -Needs elective MRI/MRCP to further evaluate -Would defer for now until current acute situation is resolved, unless general surgery prefers to have this information available prior to surgery   Arrhythmia -NSR with occasional PVCs on EKG at time of ER presentation -Currently with apparent NSR but with periodic missed beats and ?bigeminy -Will repeat EKG -Monitor on telemetry for now  HTN -Hold Norvasc until after surgery -Cover with IV prn hydralazine  HLD -She does not appear to be taking medications for this issue at this time   Hypothyroidism -Resume Synthroid post-operatively  Asthma -Resume Singulair post-operatively -Continue prn Albuterol    Note: This patient has been tested and is negative for the novel coronavirus COVID-19. The patient has been fully vaccinated against COVID-19.   Level of care: Telemetry Surgical DVT prophylaxis: SCDs Code Status:  Full - confirmed with patient Family Communication: None present Disposition Plan:  The patient is from: home  Anticipated d/c is to: home without [facility] services  Anticipated d/c date will depend on clinical response to treatment, but likely post-operatively  Patient is currently: acutely ill Consults called: Surgery  Admission status:  Admit - It is my clinical opinion that admission to INPATIENT is reasonable and necessary because of the expectation that this patient will require hospital care that crosses at least 2 midnights to treat this condition based on the medical complexity of the problems presented.  Given the aforementioned information, the predictability of an adverse outcome is felt to be significant.    Jonah Blue MD [facility]   How to contact the [facility] Attending or Consulting provider 7A - 7P or covering provider during after hours 7P -7A, for this patient?  Check the care team in [facility] and look for a) attending/consulting [facility] provider listed and  b) the [facility] team listed Log into [email or web address] and use [place]'s universal password to access. If you do not have the password, please contact the hospital operator. Locate the [facility] provider you are looking for under [facility] and page to a number that you can be directly reached. If you still have difficulty reaching the provider, please page the [facility] (Director on Call) for the Hospitalists listed on amion for assistance.   09/10/2020, 1:08 PM

## 2020-09-11 ENCOUNTER — Inpatient Hospital Stay (HOSPITAL_COMMUNITY): Payer: Medicare Other

## 2020-09-11 ENCOUNTER — Inpatient Hospital Stay (HOSPITAL_COMMUNITY): Payer: Medicare Other | Admitting: Anesthesiology

## 2020-09-11 ENCOUNTER — Encounter (HOSPITAL_COMMUNITY)
Admission: EM | Disposition: A | Discharge status: Home / Self Care | Payer: Self-pay | Source: Home / Self Care | Attending: Family Medicine

## 2020-09-11 ENCOUNTER — Encounter (HOSPITAL_COMMUNITY): Payer: Self-pay | Admitting: Internal Medicine

## 2020-09-11 DIAGNOSIS — I361 Nonrheumatic tricuspid (valve) insufficiency: Secondary | ICD-10-CM

## 2020-09-11 DIAGNOSIS — I351 Nonrheumatic aortic (valve) insufficiency: Secondary | ICD-10-CM

## 2020-09-11 HISTORY — PX: INSERTION OF MESH: SHX5868

## 2020-09-11 HISTORY — PX: REPAIR, HERNIA, INGUINAL, BILATERAL, ROBOT-ASSISTED: SHX7636

## 2020-09-11 LAB — CBC
HCT: 41.7 % (ref 36.0–46.0)
Hemoglobin: 13.7 g/dL (ref 12.0–15.0)
MCH: 27.8 pg (ref 26.0–34.0)
MCHC: 32.9 g/dL (ref 30.0–36.0)
MCV: 84.6 fL (ref 80.0–100.0)
Platelets: 278 10*3/uL (ref 150–400)
RBC: 4.93 MIL/uL (ref 3.87–5.11)
RDW: 14.4 % (ref 11.5–15.5)
WBC: 6.7 10*3/uL (ref 4.0–10.5)
nRBC: 0 % (ref 0.0–0.2)

## 2020-09-11 LAB — BASIC METABOLIC PANEL
Anion gap: 10 (ref 5–15)
BUN: 14 mg/dL (ref 8–23)
CO2: 23 mmol/L (ref 22–32)
Calcium: 8.9 mg/dL (ref 8.9–10.3)
Chloride: 103 mmol/L (ref 98–111)
Creatinine, Ser: 0.87 mg/dL (ref 0.44–1.00)
GFR, Estimated: >60 mL/min (ref >60)
Glucose, Bld: 118 mg/dL — ABNORMAL HIGH (ref 70–99)
Potassium: 3.3 mmol/L — ABNORMAL LOW (ref 3.5–5.1)
Sodium: 136 mmol/L (ref 135–145)

## 2020-09-11 LAB — ECHOCARDIOGRAM COMPLETE
Area-P 1/2: 4.46 cm2
Height: 64 in
MV VTI: 1.93 cm2
P 1/2 time: 560 msec
S' Lateral: 2.8 cm
Weight: 2119.94 oz

## 2020-09-11 SURGERY — REPAIR, HERNIA, INGUINAL, BILATERAL, ROBOT-ASSISTED
Anesthesia: General | Site: Groin | Laterality: Bilateral

## 2020-09-11 MED ORDER — LIDOCAINE 2% (20 MG/ML) 5 ML SYRINGE
INTRAMUSCULAR | Status: DC | PRN
  Administered 2020-09-11: 40 mg via INTRAVENOUS

## 2020-09-11 MED ORDER — ENOXAPARIN SODIUM 40 MG/0.4ML IJ SOSY
40.0000 mg | PREFILLED_SYRINGE | INTRAMUSCULAR | Status: DC
  Administered 2020-09-12: 40 mg via SUBCUTANEOUS
  Filled 2020-09-11: qty 0.4

## 2020-09-11 MED ORDER — PROPOFOL 10 MG/ML IV BOLUS
INTRAVENOUS | Status: AC
  Filled 2020-09-11: qty 20

## 2020-09-11 MED ORDER — ONDANSETRON HCL 4 MG/2ML IJ SOLN
INTRAMUSCULAR | Status: DC | PRN
  Administered 2020-09-11: 4 mg via INTRAVENOUS

## 2020-09-11 MED ORDER — BUPIVACAINE HCL 0.25 % IJ SOLN
INTRAMUSCULAR | Status: DC | PRN
  Administered 2020-09-11: 7 mL

## 2020-09-11 MED ORDER — BUPIVACAINE LIPOSOME 1.3 % IJ SUSP
INTRAMUSCULAR | Status: AC
  Filled 2020-09-11: qty 20

## 2020-09-11 MED ORDER — PHENYLEPHRINE 40 MCG/ML (10ML) SYRINGE FOR IV PUSH (FOR BLOOD PRESSURE SUPPORT)
PREFILLED_SYRINGE | INTRAVENOUS | Status: DC | PRN
  Administered 2020-09-11: 80 ug via INTRAVENOUS
  Administered 2020-09-11: 120 ug via INTRAVENOUS

## 2020-09-11 MED ORDER — POTASSIUM CHLORIDE CRYS ER 20 MEQ PO TBCR
40.0000 meq | EXTENDED_RELEASE_TABLET | Freq: Once | ORAL | Status: AC
  Administered 2020-09-11: 40 meq via ORAL
  Filled 2020-09-11: qty 2

## 2020-09-11 MED ORDER — FENTANYL CITRATE (PF) 100 MCG/2ML IJ SOLN: 25.0000 ug | INTRAMUSCULAR | Status: DC | PRN

## 2020-09-11 MED ORDER — DEXTROSE-NACL 5-0.9 % IV SOLN: INTRAVENOUS | Status: DC

## 2020-09-11 MED ORDER — PROPOFOL 500 MG/50ML IV EMUL
INTRAVENOUS | Status: DC | PRN
  Administered 2020-09-11: 50 mg via INTRAVENOUS

## 2020-09-11 MED ORDER — ORAL CARE MOUTH RINSE: 15.0000 mL | Freq: Once | OROMUCOSAL | Status: AC

## 2020-09-11 MED ORDER — ONDANSETRON 4 MG PO TBDP: 4.0000 mg | ORAL_TABLET | Freq: Four times a day (QID) | ORAL | Status: DC | PRN

## 2020-09-11 MED ORDER — HYDROCODONE-ACETAMINOPHEN 5-325 MG PO TABS: 1.0000 | ORAL_TABLET | ORAL | Status: DC | PRN

## 2020-09-11 MED ORDER — PHENYLEPHRINE HCL-NACL 20-0.9 MG/250ML-% IV SOLN
INTRAVENOUS | Status: DC | PRN
  Administered 2020-09-11: 50 ug/min via INTRAVENOUS

## 2020-09-11 MED ORDER — SUGAMMADEX SODIUM 200 MG/2ML IV SOLN
INTRAVENOUS | Status: DC | PRN
  Administered 2020-09-11: 200 mg via INTRAVENOUS

## 2020-09-11 MED ORDER — SUCCINYLCHOLINE CHLORIDE 200 MG/10ML IV SOSY
PREFILLED_SYRINGE | INTRAVENOUS | Status: DC | PRN
  Administered 2020-09-11: 80 mg via INTRAVENOUS

## 2020-09-11 MED ORDER — CHLORHEXIDINE GLUCONATE CLOTH 2 % EX PADS: 6.0000 | MEDICATED_PAD | Freq: Once | CUTANEOUS | Status: DC

## 2020-09-11 MED ORDER — SUCCINYLCHOLINE CHLORIDE 200 MG/10ML IV SOSY
PREFILLED_SYRINGE | INTRAVENOUS | Status: AC
  Filled 2020-09-11: qty 10

## 2020-09-11 MED ORDER — FENTANYL CITRATE (PF) 250 MCG/5ML IJ SOLN
INTRAMUSCULAR | Status: DC | PRN
  Administered 2020-09-11 (×2): 50 ug via INTRAVENOUS
  Administered 2020-09-11: 100 ug via INTRAVENOUS
  Administered 2020-09-11: 50 ug via INTRAVENOUS

## 2020-09-11 MED ORDER — ONDANSETRON HCL 4 MG/2ML IJ SOLN
INTRAMUSCULAR | Status: AC
  Filled 2020-09-11: qty 2

## 2020-09-11 MED ORDER — LACTATED RINGERS IV SOLN: INTRAVENOUS | Status: DC

## 2020-09-11 MED ORDER — 0.9 % SODIUM CHLORIDE (POUR BTL) OPTIME
TOPICAL | Status: DC | PRN
  Administered 2020-09-11: 1000 mL

## 2020-09-11 MED ORDER — ROCURONIUM BROMIDE 10 MG/ML (PF) SYRINGE
PREFILLED_SYRINGE | INTRAVENOUS | Status: AC
  Filled 2020-09-11: qty 10

## 2020-09-11 MED ORDER — ONDANSETRON HCL 4 MG/2ML IJ SOLN: 4.0000 mg | Freq: Four times a day (QID) | INTRAMUSCULAR | Status: DC | PRN

## 2020-09-11 MED ORDER — ACETAMINOPHEN 500 MG PO TABS: 1000.0000 mg | ORAL_TABLET | ORAL | Status: DC

## 2020-09-11 MED ORDER — CHLORHEXIDINE GLUCONATE 0.12 % MT SOLN: 15.0000 mL | Freq: Once | OROMUCOSAL | Status: AC

## 2020-09-11 MED ORDER — AMLODIPINE BESYLATE 5 MG PO TABS
5.0000 mg | ORAL_TABLET | Freq: Every day | ORAL | Status: DC
  Administered 2020-09-12: 5 mg via ORAL
  Filled 2020-09-11: qty 1

## 2020-09-11 MED ORDER — CEFAZOLIN SODIUM-DEXTROSE 2-4 GM/100ML-% IV SOLN
2.0000 g | INTRAVENOUS | Status: DC
  Filled 2020-09-11 (×2): qty 100

## 2020-09-11 MED ORDER — ACETAMINOPHEN 10 MG/ML IV SOLN
INTRAVENOUS | Status: DC | PRN
  Administered 2020-09-11: 1000 mg via INTRAVENOUS

## 2020-09-11 MED ORDER — KETOROLAC TROMETHAMINE 15 MG/ML IJ SOLN: 15.0000 mg | Freq: Four times a day (QID) | INTRAMUSCULAR | Status: DC | PRN

## 2020-09-11 MED ORDER — SODIUM CHLORIDE 0.9 % IV SOLN
INTRAVENOUS | Status: DC | PRN
  Administered 2020-09-11: 40 mL

## 2020-09-11 MED ORDER — ROCURONIUM BROMIDE 10 MG/ML (PF) SYRINGE
PREFILLED_SYRINGE | INTRAVENOUS | Status: DC | PRN
  Administered 2020-09-11 (×2): 20 mg via INTRAVENOUS
  Administered 2020-09-11: 30 mg via INTRAVENOUS

## 2020-09-11 MED ORDER — LEVOTHYROXINE SODIUM 25 MCG PO TABS
25.0000 ug | ORAL_TABLET | Freq: Every day | ORAL | Status: DC
  Administered 2020-09-12: 25 ug via ORAL
  Filled 2020-09-11: qty 1

## 2020-09-11 MED ORDER — ACETAMINOPHEN 10 MG/ML IV SOLN
INTRAVENOUS | Status: AC
  Filled 2020-09-11: qty 100

## 2020-09-11 MED ORDER — FENTANYL CITRATE (PF) 250 MCG/5ML IJ SOLN
INTRAMUSCULAR | Status: AC
  Filled 2020-09-11: qty 5

## 2020-09-11 MED ORDER — CHLORHEXIDINE GLUCONATE 0.12 % MT SOLN
OROMUCOSAL | Status: AC
  Administered 2020-09-11: 15 mL via OROMUCOSAL
  Filled 2020-09-11: qty 15

## 2020-09-11 MED ORDER — LIDOCAINE 2% (20 MG/ML) 5 ML SYRINGE
INTRAMUSCULAR | Status: AC
  Filled 2020-09-11: qty 5

## 2020-09-11 MED ORDER — CEFAZOLIN SODIUM-DEXTROSE 2-4 GM/100ML-% IV SOLN
2.0000 g | INTRAVENOUS | Status: AC
  Administered 2020-09-11: 2 g via INTRAVENOUS

## 2020-09-11 MED ORDER — ENSURE PRE-SURGERY PO LIQD: 296.0000 mL | Freq: Once | ORAL | Status: DC

## 2020-09-11 MED ORDER — BUPIVACAINE HCL (PF) 0.25 % IJ SOLN
INTRAMUSCULAR | Status: AC
  Filled 2020-09-11: qty 30

## 2020-09-11 SURGICAL SUPPLY — 52 items
BAG COUNTER SPONGE SURGICOUNT (BAG) IMPLANT
CHLORAPREP W/TINT 26 (MISCELLANEOUS) ×2 IMPLANT
COVER MAYO STAND STRL (DRAPES) ×2 IMPLANT
COVER SURGICAL LIGHT HANDLE (MISCELLANEOUS) ×2 IMPLANT
COVER TIP SHEARS 8 DVNC (MISCELLANEOUS) ×1 IMPLANT
COVER TIP SHEARS 8MM DA VINCI (MISCELLANEOUS) ×1
DECANTER SPIKE VIAL GLASS SM (MISCELLANEOUS) ×2 IMPLANT
DEFOGGER SCOPE WARMER CLEARIFY (MISCELLANEOUS) ×2 IMPLANT
DERMABOND ADVANCED (GAUZE/BANDAGES/DRESSINGS) ×1
DERMABOND ADVANCED .7 DNX12 (GAUZE/BANDAGES/DRESSINGS) ×1 IMPLANT
DEVICE TROCAR PUNCTURE CLOSURE (ENDOMECHANICALS) ×2 IMPLANT
DRAPE ARM DVNC X/XI (DISPOSABLE) ×4 IMPLANT
DRAPE COLUMN DVNC XI (DISPOSABLE) ×1 IMPLANT
DRAPE CV SPLIT W-CLR ANES SCRN (DRAPES) ×2 IMPLANT
DRAPE DA VINCI XI ARM (DISPOSABLE) ×4
DRAPE DA VINCI XI COLUMN (DISPOSABLE) ×1
DRAPE ORTHO SPLIT 77X108 STRL (DRAPES) ×1
DRAPE SURG ORHT 6 SPLT 77X108 (DRAPES) ×1 IMPLANT
ELECT REM PT RETURN 9FT ADLT (ELECTROSURGICAL) ×2
ELECTRODE REM PT RTRN 9FT ADLT (ELECTROSURGICAL) ×1 IMPLANT
GLOVE SURG ENC MOIS LTX SZ7.5 (GLOVE) ×4 IMPLANT
GOWN STRL REUS W/ TWL LRG LVL3 (GOWN DISPOSABLE) ×2 IMPLANT
GOWN STRL REUS W/ TWL XL LVL3 (GOWN DISPOSABLE) ×2 IMPLANT
GOWN STRL REUS W/TWL 2XL LVL3 (GOWN DISPOSABLE) ×2 IMPLANT
GOWN STRL REUS W/TWL LRG LVL3 (GOWN DISPOSABLE) ×2
GOWN STRL REUS W/TWL XL LVL3 (GOWN DISPOSABLE) ×2
KIT BASIN OR (CUSTOM PROCEDURE TRAY) ×2 IMPLANT
KIT TURNOVER KIT B (KITS) ×2 IMPLANT
MARKER SKIN DUAL TIP RULER LAB (MISCELLANEOUS) ×2 IMPLANT
MESH PROGRIP LAP SELF FIXATING (Mesh General) ×2 IMPLANT
MESH PROGRIP LAP SLF FIX 16X12 (Mesh General) ×1 IMPLANT
NDL INSUFFLATION 14GA 120MM (NEEDLE) ×1 IMPLANT
NEEDLE HYPO 22GX1.5 SAFETY (NEEDLE) ×2 IMPLANT
NEEDLE INSUFFLATION 14GA 120MM (NEEDLE) ×2 IMPLANT
OBTURATOR OPTICAL STANDARD 8MM (TROCAR)
OBTURATOR OPTICAL STND 8 DVNC (TROCAR)
OBTURATOR OPTICALSTD 8 DVNC (TROCAR) IMPLANT
PAD ARMBOARD 7.5X6 YLW CONV (MISCELLANEOUS) ×4 IMPLANT
SEAL CANN UNIV 5-8 DVNC XI (MISCELLANEOUS) ×2 IMPLANT
SEAL XI 5MM-8MM UNIVERSAL (MISCELLANEOUS) ×2
SET IRRIG TUBING LAPAROSCOPIC (IRRIGATION / IRRIGATOR) IMPLANT
SET TUBE SMOKE EVAC HIGH FLOW (TUBING) ×2 IMPLANT
STOPCOCK 4 WAY LG BORE MALE ST (IV SETS) ×2 IMPLANT
SUT MNCRL AB 4-0 PS2 18 (SUTURE) ×2 IMPLANT
SUT VIC AB 2-0 SH 27 (SUTURE)
SUT VIC AB 2-0 SH 27X BRD (SUTURE) IMPLANT
SUT VLOC 180 2-0 6IN GS21 (SUTURE) ×3 IMPLANT
SUT VLOC 180 2-0 9IN GS21 (SUTURE) ×3 IMPLANT
SYR 30ML SLIP (SYRINGE) ×2 IMPLANT
SYR TOOMEY 50ML (SYRINGE) ×2 IMPLANT
TOWEL GREEN STERILE FF (TOWEL DISPOSABLE) ×2 IMPLANT
TRAY LAPAROSCOPIC MC (CUSTOM PROCEDURE TRAY) ×2 IMPLANT

## 2020-09-11 NOTE — Anesthesia Procedure Notes (Signed)
Procedure Name: Intubation Date/Time: 09/11/2020 10:30 AM Performed by: Babs Bertin, CRNA Pre-anesthesia Checklist: Patient identified, Emergency Drugs available, Suction available and Patient being monitored Patient Re-evaluated:Patient Re-evaluated prior to induction Oxygen Delivery Method: Circle system utilized Preoxygenation: Pre-oxygenation with 100% oxygen Induction Type: IV induction and Rapid sequence Laryngoscope Size: Mac and 3 Grade View: Grade I Tube type: Oral Tube size: 7.0 mm Number of attempts: 1 Airway Equipment and Method: Stylet and Oral airway Placement Confirmation: ETT inserted through vocal cords under direct vision, positive ETCO2 and breath sounds checked- equal and bilateral Secured at: 19 cm Tube secured with: Tape Dental Injury: Teeth and Oropharynx as per pre-operative assessment

## 2020-09-11 NOTE — Anesthesia Preprocedure Evaluation (Addendum)
Anesthesia Evaluation  Patient identified by MRN, date of birth, ID band Patient awake    Reviewed: Allergy & Precautions, H&P , NPO status , Patient's Chart, lab work & pertinent test results  Airway Mallampati: II  TM Distance: >3 FB Neck ROM: Full    Dental no notable dental hx. (+) Teeth Intact, Dental Advisory Given   Pulmonary asthma ,    Pulmonary exam normal breath sounds clear to auscultation       Cardiovascular hypertension, Pt. on medications  Rhythm:Regular Rate:Normal     Neuro/Psych negative neurological ROS  negative psych ROS   GI/Hepatic Neg liver ROS, GERD  ,  Endo/Other  Hypothyroidism   Renal/GU Renal disease  negative genitourinary   Musculoskeletal   Abdominal   Peds  Hematology  (+) Blood dyscrasia, anemia ,   Anesthesia Other Findings   Reproductive/Obstetrics negative OB ROS                            Anesthesia Physical Anesthesia Plan  ASA: 3  Anesthesia Plan: General   Post-op Pain Management:    Induction: Intravenous, Rapid sequence and Cricoid pressure planned  PONV Risk Score and Plan: 4 or greater and Ondansetron, Dexamethasone and Treatment may vary due to age or medical condition  Airway Management Planned: Oral ETT  Additional Equipment:   Intra-op Plan:   Post-operative Plan: Extubation in OR  Informed Consent: I have reviewed the patients History and Physical, chart, labs and discussed the procedure including the risks, benefits and alternatives for the proposed anesthesia with the patient or authorized representative who has indicated his/her understanding and acceptance.     Dental advisory given  Plan Discussed with: CRNA  Anesthesia Plan Comments:        Anesthesia [place] Evaluation

## 2020-09-11 NOTE — Plan of Care (Signed)
Pt admitted for weakness from SNF. Pt compliant with meds and care. BP (!) 146/81 (BP Location: Left Arm)   Pulse (!) 51   Temp 98.3 F (36.8 C) (Oral)   Resp 16   Ht 5\' 4"  (1.626 m)   Wt 60.1 kg   SpO2 96%   BMI 22.74 kg/m  09/11/20. 2:29 AM

## 2020-09-11 NOTE — Progress Notes (Signed)
[facility] PROGRESS NOTE    Dominique Ochoa  BDZ:[id number] DOB: Aug 13, 1940 DOA: 09/09/2020 PCP: [employer], NP      Brief Narrative:  Dominique Ochoa is a 80 y.o. F with HTN, hypothyroidism, asthma, OSA, and hernia who presented with abdominal pain, N/V.  CT showed SBO.  There was attempt at reduction of the hernia, then placement of an NG tube.  She was admitted and surgery were consulted.         Assessment & Plan:  Small bowel obstruction due to bilateral incarcerated large indirect inguinal hernias Status post robotic bilateral inguinal hernia repair with mesh on 8/15 by Dr. Derrell Lolling - Postop pain control and diet advancement per surgery   Hypertension Blood pressure normal - Resume amlodipine  Hypokalemia - Supplement potassium  Hypothyroidism -Resume levothyroxine  Pancreatic head ductal dillation, incidetnal finding - Elective MRCP post-discharge  Asthma Well controlled  CKD ruled out      Disposition: Status is: Inpatient  Remains inpatient appropriate because:IV treatments appropriate due to intensity of illness or inability to take PO  Dispo: The patient is from: Home              Anticipated d/c is to: Home              Patient currently is not medically stable to d/c.   Difficult to place patient No       Level of care: Telemetry Surgical       MDM: The below labs and imaging reports were reviewed and summarized above.  Medication management as above.    DVT prophylaxis: enoxaparin (LOVENOX) injection 40 mg Start: 09/12/20 0800 SCD's Start: 09/11/20 1401 SCDs Start: 09/10/20 1213  Code Status: Full code         Subjective: Patient's pain is resolved.  She feels well.  No more vomiting after the surgery.  No fever, chest pain, dyspnea.  Objective: Vitals:   09/11/20 1315 09/11/20 1330 09/11/20 1352 09/11/20 1645  BP: (!) 143/83 133/83 134/78 115/79  Pulse: 88 94 94 100  Resp: 12 18  20    Temp:  97.8 F (36.6 C) (!) 97.5 F (36.4 C) 98.5 F (36.9 C)  TempSrc:    Oral  SpO2: 96% 97% 96% 100%  Weight:      Height:        Intake/Output Summary (Last 24 hours) at 09/11/2020 1808 Last data filed at 09/11/2020 1255 Gross per 24 hour  Intake 2732.89 ml  Output 120 ml  Net 2612.89 ml   Filed Weights   09/09/20 1851 09/10/20 1115  Weight: 63 kg 60.1 kg    Examination: General appearance:  adult female, alert and in no acute distress.   HEENT: Anicteric, conjunctiva pink, lids and lashes normal. No nasal deformity, discharge, epistaxis.  Lips moist.   Skin: Warm and dry.  no jaundice.  No suspicious rashes or lesions. Cardiac: RRR, nl S1-S2, no murmurs appreciated.  Capillary refill is brisk.  Respiratory: Normal respiratory rate and rhythm.  CTAB without rales or wheezes. Abdomen: Abdomen soft.  Mild voluntary guarding, her laparoscopic incisions appear clean and intact. No ascites, distension, hepatosplenomegaly.   MSK: No deformities or effusions. Neuro: Awake and alert.  EOMI, moves all extremities. Speech fluent.    Psych: Sensorium intact and responding to questions, attention normal. Affect pleasant.  Judgment and insight appear normal.    Data Reviewed: I have personally reviewed following labs and imaging studies:  CBC: Recent Labs  Lab 09/09/20 1925 09/11/20 0325  WBC 7.4 6.7  NEUTROABS 5.6  --   HGB 14.3 13.7  HCT 43.9 41.7  MCV 83.5 84.6  PLT 291 278   Basic Metabolic Panel: Recent Labs  Lab 09/09/20 1925 09/11/20 0325  NA 138 136  K 3.6 3.3*  CL 100 103  CO2 26 23  GLUCOSE 136* 118*  BUN 18 14  CREATININE 0.93 0.87  CALCIUM 9.6 8.9   GFR: Estimated Creatinine Clearance: 43.1 mL/min (by C-G formula based on SCr of 0.87 mg/dL). Liver Function Tests: Recent Labs  Lab 09/09/20 1925  AST 32  ALT 22  ALKPHOS 74  BILITOT 0.7  PROT 8.8*  ALBUMIN 4.2   Recent Labs  Lab 09/09/20 1925  LIPASE 39   No results for input(s):  AMMONIA in the last 168 hours. Coagulation Profile: No results for input(s): INR, PROTIME in the last 168 hours. Cardiac Enzymes: No results for input(s): CKTOTAL, CKMB, CKMBINDEX, TROPONINI in the last 168 hours. BNP (last 3 results) No results for input(s): PROBNP in the last 8760 hours. HbA1C: No results for input(s): HGBA1C in the last 72 hours. CBG: No results for input(s): GLUCAP in the last 168 hours. Lipid Profile: No results for input(s): CHOL, HDL, LDLCALC, TRIG, CHOLHDL, LDLDIRECT in the last 72 hours. Thyroid Function Tests: No results for input(s): TSH, T4TOTAL, FREET4, T3FREE, THYROIDAB in the last 72 hours. Anemia Panel: No results for input(s): VITAMINB12, FOLATE, FERRITIN, TIBC, IRON, RETICCTPCT in the last 72 hours. Urine analysis:    Component Value Date/Time   COLORURINE YELLOW 09/09/2020 1925   APPEARANCEUR CLEAR 09/09/2020 1925   LABSPEC 1.020 09/09/2020 1925   PHURINE 7.0 09/09/2020 1925   GLUCOSEU NEGATIVE 09/09/2020 1925   HGBUR NEGATIVE 09/09/2020 1925   BILIRUBINUR NEGATIVE 09/09/2020 1925   KETONESUR 15 (A) 09/09/2020 1925   PROTEINUR 30 (A) 09/09/2020 1925   UROBILINOGEN 1.0 12/03/2013 2108   NITRITE NEGATIVE 09/09/2020 1925   LEUKOCYTESUR SMALL (A) 09/09/2020 1925   Sepsis Labs: (procalcitonin:4,lacticacidven:4)  ) Recent Results (from the past 240 hour(s))  Resp Panel by RT-PCR (Flu A&B, Covid) Nasopharyngeal Swab     Status: None   Collection Time: 09/09/20 10:28 PM   Specimen: Nasopharyngeal Swab; Nasopharyngeal(NP) swabs in vial transport medium  Result Value Ref Range Status   SARS Coronavirus 2 by RT PCR NEGATIVE NEGATIVE Final    Comment: (NOTE) SARS-CoV-2 target nucleic acids are NOT DETECTED.  The SARS-CoV-2 RNA is generally detectable in upper respiratory specimens during the acute phase of infection. The lowest concentration of SARS-CoV-2 viral copies this assay can detect is 138 copies/mL. A negative result does not  preclude SARS-Cov-2 infection and should not be used as the sole basis for treatment or other patient management decisions. A negative result may occur with  improper specimen collection/handling, submission of specimen other than nasopharyngeal swab, presence of viral mutation(s) within the areas targeted by this assay, and inadequate number of viral copies(<138 copies/mL). A negative result must be combined with clinical observations, patient history, and epidemiological information. The expected result is Negative.  Fact Sheet for Patients:  BloggerCourse.com  Fact Sheet for Healthcare Providers:  SeriousBroker.it  This test is no t yet approved or cleared by the [place] FDA and  has been authorized for detection and/or diagnosis of SARS-CoV-2 by FDA under an Emergency Use Authorization (EUA). This EUA will remain  in effect (meaning this test can be used) for the duration of the COVID-19 declaration under Section  564(b)(1) of the Act, 21 U.S.C.section 360bbb-3(b)(1), unless the authorization is terminated  or revoked sooner.       Influenza A by PCR NEGATIVE NEGATIVE Final   Influenza B by PCR NEGATIVE NEGATIVE Final    Comment: (NOTE) The Xpert Xpress SARS-CoV-2/FLU/RSV plus assay is intended as an aid in the diagnosis of influenza from Nasopharyngeal swab specimens and should not be used as a sole basis for treatment. Nasal washings and aspirates are unacceptable for Xpert Xpress SARS-CoV-2/FLU/RSV testing.  Fact Sheet for Patients: BloggerCourse.com  Fact Sheet for Healthcare Providers: SeriousBroker.it  This test is not yet approved or cleared by the [place] FDA and has been authorized for detection and/or diagnosis of SARS-CoV-2 by FDA under an Emergency Use Authorization (EUA). This EUA will remain in effect (meaning this test can be used) for the  duration of the COVID-19 declaration under Section 564(b)(1) of the Act, 21 U.S.C. section 360bbb-3(b)(1), unless the authorization is terminated or revoked.  Performed at [facility], [address] [place], [place] [address]          Radiology Studies: CT Abdomen Pelvis W Contrast  Result Date: 09/09/2020 CLINICAL DATA:  Diverticulitis suspected Patient reports abdominal pain and vomiting. EXAM: CT ABDOMEN AND PELVIS WITH CONTRAST TECHNIQUE: Multidetector CT imaging of the abdomen and pelvis was performed using the standard protocol following bolus administration of intravenous contrast. CONTRAST:  OMNIPAQUE IOHEXOL 300 MG/ML  SOLN COMPARISON:  CT 04/16/2019 FINDINGS: Lower chest: Minimal lingular and right middle lobe atelectasis. No pleural effusion. Upper normal heart size. Hepatobiliary: No focal hepatic lesion. Post cholecystectomy. Mild intrahepatic biliary ductal dilatation. Common bile duct measures 10 mm, normal for postcholecystectomy state. Pancreas: Parenchymal atrophy. There is progressive pancreatic ductal dilatation from prior exam, 6 mm in the pancreatic head. No acute peripancreatic inflammation. Small cyst in the pancreatic body measures 10 mm, unchanged from prior, series 10, image 25. There is no obvious obstructing pancreatic mass by CT. Spleen: Normal in size. There are few nonspecific low densities in the upper aspect of the spleen. Adrenals/Urinary Tract: Normal adrenal glands. No hydronephrosis. Multiple subcentimeter bilateral low-density renal cortical lesions are typically cysts. Symmetric renal excretion on delayed phase imaging. The urinary bladder is unremarkable. Stomach/Bowel: The stomach is distended with intraluminal fluid. There may be mild gastric hyperemia and perigastric fat stranding. Dilated fluid-filled small bowel within the left abdomen there is a large left lower quadrant ventral abdominal wall/inguinal hernia containing loops of  small bowel as well as colon. Small bowel within and proximal to the hernia are dilated and fluid-filled. No bowel pneumatosis. The exiting small bowel appears decompressed. There is a large right lower quadrant ventral abdominal wall/inguinal hernia containing small bowel and portions of colon, without obstruction. The appendix is located within this large hernia. There is diffuse colonic diverticulosis without focal diverticulitis. Vascular/Lymphatic: Aortic atherosclerosis and tortuosity. No portal venous or mesenteric gas. Patent portal and mesenteric veins. No bulky abdominopelvic adenopathy. Reproductive: Uterus remains in situ, anteverted. No obvious adnexal mass. Other: Small volume of free fluid in the pelvis. No free air. Large bilateral lower ventral abdominal wall/inguinal hernias containing large and small bowel, causing obstruction on the left. Musculoskeletal: Multilevel degenerative change in the lumbar spine. Grade 1 anterolisthesis of L4 on L5 is likely facet mediated. There are no acute or suspicious osseous abnormalities. IMPRESSION: 1. Small bowel obstruction secondary to a left lower quadrant ventral abdominal wall/inguinal hernia. Hernia also contains noninflamed colon. 2. Large right ventral  abdominal wall/inguinal hernia containing nonobstructed small bowel, colon, and appendix. 3. Progressive pancreatic ductal dilatation from prior exam, 6 mm in the pancreatic head. No obvious obstructing pancreatic mass by CT. Stable appearance of 10 mm pancreatic cyst in the body. Recommend further evaluation with pancreatic protocol MRI/MRCP on an elective basis to exclude the possibility of occult pancreatic head lesion. 4. Colonic diverticulosis without diverticulitis. Aortic Atherosclerosis (ICD10-I70.0). Electronically Signed   By: Narda Rutherford M.D.   On: 09/09/2020 21:39   DG Abd Portable 1V  Result Date: 09/09/2020 CLINICAL DATA:  Check gastric catheter placement EXAM: PORTABLE ABDOMEN - 1  VIEW COMPARISON:  CT from earlier in the same day. FINDINGS: Gastric catheter is noted coiled within the stomach. IMPRESSION: Gastric catheter within the stomach. Electronically Signed   By: Alcide Clever M.D.   On: 09/09/2020 23:06   ECHOCARDIOGRAM COMPLETE  Result Date: 09/11/2020    ECHOCARDIOGRAM REPORT   Patient Name:   Charlyne Mom Date of Exam: 09/11/2020 Medical Rec #:  [id number]           Height:       64.0 in Accession #:    [id number]          Weight:       132.5 lb Date of Birth:  15-Aug-1940           BSA:          1.642 m Patient Age:    80 years            BP:           145/69 mmHg Patient Gender: F                   HR:           44 bpm. Exam Location:  Inpatient Procedure: 2D Echo, Cardiac Doppler and Color Doppler Indications:    CVA  History:        Patient has prior history of Echocardiogram examinations, most                 recent 10/16/2014. CAD, Carotid Disease; Risk                 Factors:Hypertension, Dyslipidemia, Sleep Apnea and morbid                 obesity.  Sonographer:    Lavenia Atlas RDCS Referring Phys: [id number] JENNIFER YATES  Sonographer Comments: Patient is morbidly obese. IMPRESSIONS  1. Left ventricular ejection fraction, by estimation, is 65 to 70%. The left ventricle has normal function. The left ventricle has no regional wall motion abnormalities. There is mild concentric left ventricular hypertrophy. Left ventricular diastolic parameters are consistent with Grade I diastolic dysfunction (impaired relaxation).  2. Right ventricular systolic function is normal. The right ventricular size is normal. There is moderately elevated pulmonary artery systolic pressure.  3. The mitral valve is degenerative. Trivial mitral valve regurgitation. The mean mitral valve gradient is 3.0 mmHg with average heart rate of 97 bpm.  4. The aortic valve is tricuspid. There is mild calcification of the aortic valve. Aortic valve regurgitation is mild.  5. The inferior vena cava is normal  in size with greater than 50% respiratory variability, suggesting right atrial pressure of 3 mmHg. Comparison(s): A prior study was performed on 04/04/2018. Prior images reviewed side by side. 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 Frequent PACs noted in this study. FINDINGS  Left Ventricle: Left ventricular ejection fraction, by estimation, is 65 to 70%. The left ventricle has normal function. The left ventricle has no regional wall motion abnormalities. The left ventricular internal cavity size was normal in size. There is  mild concentric left ventricular hypertrophy. Left ventricular diastolic parameters are consistent with Grade I diastolic dysfunction (impaired relaxation). Right Ventricle: The right ventricular size is normal. No increase in right ventricular wall thickness. Right ventricular systolic function is normal. There is moderately elevated pulmonary artery systolic pressure. The tricuspid regurgitant velocity is 3.33 m/s, and with an assumed right atrial pressure of 3 mmHg, the estimated right ventricular systolic pressure is 47.4 mmHg. Left Atrium: Left atrial size was normal in size. Right Atrium: Right atrial size was normal in size. Pericardium: There is no evidence of pericardial effusion. Mitral Valve: The mitral valve is degenerative in appearance. Trivial mitral valve regurgitation. MV peak gradient, 13.1 mmHg. The mean mitral valve gradient is 3.0 mmHg with average heart rate of 97 bpm. Tricuspid Valve: Mechanism of spectral Doppler flow suggestive of prolapse. The tricuspid valve is grossly normal. Tricuspid valve regurgitation is mild . No evidence of tricuspid stenosis. Aortic Valve: The aortic valve is tricuspid. There is mild calcification of the aortic valve. Aortic valve regurgitation is mild. Aortic regurgitation PHT measures 560 msec. Pulmonic Valve: The pulmonic valve was grossly normal. Pulmonic valve regurgitation is mild. No evidence of  pulmonic stenosis. Aorta: The aortic root is normal in size and structure and the ascending aorta was not well visualized. Venous: The inferior vena cava is normal in size with greater than 50% respiratory variability, suggesting right atrial pressure of 3 mmHg. IAS/Shunts: The interatrial septum is aneurysmal. The atrial septum is grossly normal.  LEFT VENTRICLE PLAX 2D LVIDd:         4.20 cm  Diastology LVIDs:         2.80 cm  LV e' medial:    4.68 cm/s LV PW:         1.00 cm  LV E/e' medial:  9.4 LV IVS:        1.10 cm  LV e' lateral:   7.07 cm/s LVOT diam:     2.00 cm  LV E/e' lateral: 6.2 LV SV:         54 LV SV Index:   33 LVOT Area:     3.14 cm  RIGHT VENTRICLE RV Basal diam:  2.60 cm RV S prime:     5.11 cm/s TAPSE (M-mode): 1.6 cm LEFT ATRIUM             Index       RIGHT ATRIUM           Index LA diam:        3.20 cm 1.95 cm/m  RA Area:     15.50 cm LA Vol (A2C):   27.6 ml 16.81 ml/m RA Volume:   41.60 ml  25.33 ml/m LA Vol (A4C):   48.1 ml 29.29 ml/m LA Biplane Vol: 40.5 ml 24.66 ml/m  AORTIC VALVE LVOT Vmax:   80.50 cm/s LVOT Vmean:  57.500 cm/s LVOT VTI:    0.171 m AI PHT:      560 msec  AORTA Ao Root diam: 2.90 cm MITRAL VALVE               TRICUSPID VALVE MV Area (PHT): 4.46 cm    TR Peak grad:   44.4 mmHg MV Area VTI:   1.93 cm  TR Vmax:        333.00 cm/s MV Peak grad:  13.1 mmHg MV Mean grad:  3.0 mmHg    SHUNTS MV Vmax:       1.81 m/s    Systemic VTI:  0.17 m MV Vmean:      68.9 cm/s   Systemic Diam: 2.00 cm MV Decel Time: 170 msec MV E velocity: 44.10 cm/s MV A velocity: 86.30 cm/s MV E/A ratio:  0.51 Riley Lam MD Electronically signed by Riley Lam MD Signature Date/Time: 09/11/2020/11:14:33 AM    Final         Scheduled Meds:  [START ON 09/12/2020] enoxaparin (LOVENOX) injection  40 mg Subcutaneous Q24H   Continuous Infusions:  dextrose 5 % and 0.9% NaCl     lactated ringers 75 mL/hr at 09/10/20 1309     LOS: 1 day    Time spent: 25  minutes    Alberteen Sam, MD [facility] 09/11/2020, 6:08 PM     Please page though AMION or Epic secure chat:  For [employer], Higher education careers adviser

## 2020-09-11 NOTE — Anesthesia Postprocedure Evaluation (Signed)
Anesthesia Post Note  Patient: Dominique Ochoa  Procedure(s) Performed: XI ROBOTIC ASSISTED BILATERAL INGUINAL HERNIA WITH MESH (Bilateral: Groin) INSERTION OF MESH (Bilateral: Groin)     Patient location during evaluation: PACU Anesthesia Type: General Level of consciousness: awake and alert Pain management: pain level controlled Vital Signs Assessment: post-procedure vital signs reviewed and stable Respiratory status: spontaneous breathing, nonlabored ventilation and respiratory function stable Cardiovascular status: blood pressure returned to baseline and stable Postop Assessment: no apparent nausea or vomiting Anesthetic complications: no   No notable events documented.  Last Vitals:  Vitals:   09/11/20 1315 09/11/20 1330  BP: (!) 143/83 133/83  Pulse: 88 94  Resp: 12 18  Temp:  36.6 C  SpO2: 96% 97%    Last Pain:  Vitals:   09/11/20 1330  TempSrc:   PainSc: 0-No pain                 Rondal Vandevelde,W. EDMOND

## 2020-09-11 NOTE — Progress Notes (Signed)
  Echocardiogram 2D Echocardiogram has been performed.  Pieter Partridge 09/11/2020, 9:32 AM

## 2020-09-11 NOTE — Progress Notes (Signed)
Subjective/Chief Complaint: Pt with BMs today and feels better   Objective: Vital signs in last 24 hours: Temp:  [97.8 F (36.6 C)-98.3 F (36.8 C)] 98.2 F (36.8 C) (08/15 0900) Pulse Rate:  [51-105] 105 (08/15 0900) Resp:  [16-24] 18 (08/15 0900) BP: (124-146)/(81-91) 137/86 (08/15 0900) SpO2:  [95 %-100 %] 100 % (08/15 0900) Weight:  [60.1 kg] 60.1 kg (08/14 1115)    Intake/Output from previous day: 08/14 0701 - 08/15 0700 In: 2133.6 [I.V.:2133.6] Out: 150 [Emesis/NG output:100] Intake/Output this shift: No intake/output data recorded.  PE:  Constitutional: No acute distress, conversant, appears states age. Eyes: Anicteric sclerae, moist conjunctiva, no lid lag Lungs: Clear to auscultation bilaterally, normal respiratory effort CV: regular rate and rhythm, no murmurs, no peripheral edema, pedal pulses 2+ GI: Soft, no masses or hepatosplenomegaly, non-tender to palpation, BIH Skin: No rashes, palpation reveals normal turgor Psychiatric: appropriate judgment and insight, oriented to person, place, and time   Lab Results:  Recent Labs    09/09/20 1925 09/11/20 0325  WBC 7.4 6.7  HGB 14.3 13.7  HCT 43.9 41.7  PLT 291 278   BMET Recent Labs    09/09/20 1925 09/11/20 0325  NA 138 136  K 3.6 3.3*  CL 100 103  CO2 26 23  GLUCOSE 136* 118*  BUN 18 14  CREATININE 0.93 0.87  CALCIUM 9.6 8.9   PT/INR No results for input(s): LABPROT, INR in the last 72 hours. ABG No results for input(s): PHART, HCO3 in the last 72 hours.  Invalid input(s): PCO2, PO2  Studies/Results: CT Abdomen Pelvis W Contrast  Result Date: 09/09/2020 CLINICAL DATA:  Diverticulitis suspected Patient reports abdominal pain and vomiting. EXAM: CT ABDOMEN AND PELVIS WITH CONTRAST TECHNIQUE: Multidetector CT imaging of the abdomen and pelvis was performed using the standard protocol following bolus administration of intravenous contrast. CONTRAST:  OMNIPAQUE IOHEXOL 300 MG/ML   SOLN COMPARISON:  CT 04/16/2019 FINDINGS: Lower chest: Minimal lingular and right middle lobe atelectasis. No pleural effusion. Upper normal heart size. Hepatobiliary: No focal hepatic lesion. Post cholecystectomy. Mild intrahepatic biliary ductal dilatation. Common bile duct measures 10 mm, normal for postcholecystectomy state. Pancreas: Parenchymal atrophy. There is progressive pancreatic ductal dilatation from prior exam, 6 mm in the pancreatic head. No acute peripancreatic inflammation. Small cyst in the pancreatic body measures 10 mm, unchanged from prior, series 10, image 25. There is no obvious obstructing pancreatic mass by CT. Spleen: Normal in size. There are few nonspecific low densities in the upper aspect of the spleen. Adrenals/Urinary Tract: Normal adrenal glands. No hydronephrosis. Multiple subcentimeter bilateral low-density renal cortical lesions are typically cysts. Symmetric renal excretion on delayed phase imaging. The urinary bladder is unremarkable. Stomach/Bowel: The stomach is distended with intraluminal fluid. There may be mild gastric hyperemia and perigastric fat stranding. Dilated fluid-filled small bowel within the left abdomen there is a large left lower quadrant ventral abdominal wall/inguinal hernia containing loops of small bowel as well as colon. Small bowel within and proximal to the hernia are dilated and fluid-filled. No bowel pneumatosis. The exiting small bowel appears decompressed. There is a large right lower quadrant ventral abdominal wall/inguinal hernia containing small bowel and portions of colon, without obstruction. The appendix is located within this large hernia. There is diffuse colonic diverticulosis without focal diverticulitis. Vascular/Lymphatic: Aortic atherosclerosis and tortuosity. No portal venous or mesenteric gas. Patent portal and mesenteric veins. No bulky abdominopelvic adenopathy. Reproductive: Uterus remains in situ, anteverted. No obvious adnexal  mass. Other: Small  volume of free fluid in the pelvis. No free air. Large bilateral lower ventral abdominal wall/inguinal hernias containing large and small bowel, causing obstruction on the left. Musculoskeletal: Multilevel degenerative change in the lumbar spine. Grade 1 anterolisthesis of L4 on L5 is likely facet mediated. There are no acute or suspicious osseous abnormalities. IMPRESSION: 1. Small bowel obstruction secondary to a left lower quadrant ventral abdominal wall/inguinal hernia. Hernia also contains noninflamed colon. 2. Large right ventral abdominal wall/inguinal hernia containing nonobstructed small bowel, colon, and appendix. 3. Progressive pancreatic ductal dilatation from prior exam, 6 mm in the pancreatic head. No obvious obstructing pancreatic mass by CT. Stable appearance of 10 mm pancreatic cyst in the body. Recommend further evaluation with pancreatic protocol MRI/MRCP on an elective basis to exclude the possibility of occult pancreatic head lesion. 4. Colonic diverticulosis without diverticulitis. Aortic Atherosclerosis (ICD10-I70.0). Electronically Signed   By: Narda Rutherford M.D.   On: 09/09/2020 21:39   DG Abd Portable 1V  Result Date: 09/09/2020 CLINICAL DATA:  Check gastric catheter placement EXAM: PORTABLE ABDOMEN - 1 VIEW COMPARISON:  CT from earlier in the same day. FINDINGS: Gastric catheter is noted coiled within the stomach. IMPRESSION: Gastric catheter within the stomach. Electronically Signed   By: Alcide Clever M.D.   On: 09/09/2020 23:06      Assessment/Plan: 21F with SBO 2/2 to [facility], and pt with BIH -SBO appears to be resolved. -will plan on robotic possible open BIHR with mesh All risks and benefits were discussed with the patient, to generally include infection, bleeding, damage to surrounding structures, acute and chronic nerve pain, and recurrence. Alternatives were offered and described.  All questions were answered and the patient voiced understanding of the  procedure and wishes to proceed at this point.     LOS: 1 day    Axel Filler 09/11/2020

## 2020-09-11 NOTE — Transfer of Care (Signed)
Immediate Anesthesia Transfer of Care Note  Patient: Dominique Ochoa  Procedure(s) Performed: XI ROBOTIC ASSISTED BILATERAL INGUINAL HERNIA WITH MESH (Bilateral: Groin) INSERTION OF MESH (Bilateral: Groin)  Patient Location: PACU  Anesthesia Type:General  Level of Consciousness: responds to stimulation  Airway & Oxygen Therapy: Patient Spontanous Breathing  Post-op Assessment: Report given to RN and Post -op Vital signs reviewed and stable  Post vital signs: Reviewed and stable  Last Vitals:  Vitals Value Taken Time  BP 144/83 09/11/20 1300  Temp    Pulse 97 09/11/20 1304  Resp 16 09/11/20 1304  SpO2 96 % 09/11/20 1304  Vitals shown include unvalidated device data.  Last Pain:  Vitals:   09/11/20 0946  TempSrc:   PainSc: 0-No pain         Complications: No notable events documented.

## 2020-09-11 NOTE — Op Note (Signed)
09/11/2020  12:27 PM  PATIENT:  Dominique Ochoa  80 y.o. female  PRE-OPERATIVE DIAGNOSIS:  BILATERAL INGUINAL HERNIA  POST-OPERATIVE DIAGNOSIS: Bilaterally incarcerated, LARGE BILATERAL INDIRECT INGUINAL HERNIA  PROCEDURE:  Procedure(s): XI ROBOTIC ASSISTED BILATERAL INGUINAL HERNIA WITH MESH (Bilateral) INSERTION OF MESH (Bilateral)  SURGEON:  Surgeon(s) and Role:    Axel Filler, MD - Primary  ASSISTANTS: Berenda Morale, RNFA   ANESTHESIA:   local, regional, and general  EBL:  minimal   BLOOD ADMINISTERED:none  DRAINS: none   LOCAL MEDICATIONS USED:  BUPIVICAINE  and OTHER exparel  SPECIMEN:  No Specimen  DISPOSITION OF SPECIMEN:  N/A  COUNTS:  YES  TOURNIQUET:  * No tourniquets in log *  DICTATION: .Dragon Dictation Indication procedure: Patient is a 80 year old female, with a history of SBO.  Patient had very large inguinal hernias bilaterally.  Patient was taken back to the operating room urgently for robotic hernia repair with mesh bilaterally.  Findings: Patient had an extremely large bilateral indirect inguinal hernias.  On the left side there was sigmoid colon within the hernia that was reduced.  On the right side there was a portion of the right colon that easily reduced.  16 x 12 cm ProGrip mesh was placed bilaterally.  The hernia sacs were completely reduced.   Details of the procedure:   The patient was taken back to the operating room. The patient was placed in supine position with bilateral SCDs in place.  The patient was prepped and draped in the usual sterile fashion.  After appropriate anitbiotics were confirmed, a time-out was confirmed and all facts were verified.  At this time a Veress needle technique was used to inspect the abdomen approximately 10 cm from the umbilicus in the paramedian line. This time a 8 mm robotic trocar was placed into the abdomen. The camera was placed there was no injury to any intra-abdominal organs. A 58mm  umbilical port was placed just superior to the umbilicus. An 8 mm port was placed approximately 10 cm lateral to the umbilicus on the left paramedian side.   Robot was positioned over the patient and the ports were docked in the usual fashion.  At this time the right-sided incarcerated right colon was reduced.  This came down easily.  Left side there was sigmoid colon within the left colon.  I try to reduce this on several occasions however was unsuccessful.  At this time the right-sided peritoneum was taken down from the medial umbilical ligament laterally. The pre-peritoneal space was entered. Dissection was taken down to Cooper's ligament. At this time it was apparent there was an indirect hernia.  At this time I proceeded dissect out the rest Cooper's ligament and the medial to lateral direction.  At this time I proceeded to dissect around the large hernia sac on the right side.  The hernia sac was completely reduced.  The round ligament was identified and transected with cautery.  The hernia defect was extremely large.  The peritoneum was dissected back. At this time I proceeded to create a pocket laterally for the mesh.  At this time since the hernia defect was extremely large a 2-0 VLock suture was used to reapproximate the defect while avoiding the epigastric vessels.     At this time I proceeded to incise the left-sided peritoneum from the medial to lateral direction.  Dissection was taken down to Cooper's ligament.  At this time I was able to visualize the herniated sac.  This was circumferentially  dissected away from the abdominal wall.  I did have to incise the rectus fascia cephalad.  This allowed me to reduce the hernia contents and sigmoid colon completely.  At this time the hernia sac was completely dissected away from surrounding tissue.  The round ligament was identified.  This was cauterized and transected.  I created a pocket laterally for the mesh.  A 2 OV lock was also used to  reapproximate the defect this was very large.  This did not incorporate the epigastrics.  At this time 2 pieces of Progrip 16x12cm mesh was in placed into the dissected area. This covered both the direct and indirect spaces appropriately. This also covered  The femoral space.  The mesh lay flat from medial to lateral. At this time a 2 V- lock stitch was used to close the peritoneum in a standard running fashion bilaterally.  At this time the robot was undocked. The umbilical port site was reapproximated using a 0 Vicryl via an Endo Close device 1. All ports were removed. The skin was reapproximated and all port sites using 4-0 Monocryl subcuticular fashion.  The patient the procedure well was taken to the recovery    PLAN OF CARE: Admit to inpatient   PATIENT DISPOSITION:  PACU - hemodynamically stable.   Delay start of Pharmacological VTE agent (>24hrs) due to surgical blood loss or risk of bleeding: not applicable

## 2020-09-12 ENCOUNTER — Encounter (HOSPITAL_COMMUNITY): Payer: Self-pay | Admitting: General Surgery

## 2020-09-12 LAB — CBC
HCT: 36.2 % (ref 36.0–46.0)
Hemoglobin: 12 g/dL (ref 12.0–15.0)
MCH: 27.9 pg (ref 26.0–34.0)
MCHC: 33.1 g/dL (ref 30.0–36.0)
MCV: 84.2 fL (ref 80.0–100.0)
Platelets: 224 10*3/uL (ref 150–400)
RBC: 4.3 MIL/uL (ref 3.87–5.11)
RDW: 14.6 % (ref 11.5–15.5)
WBC: 7.9 10*3/uL (ref 4.0–10.5)
nRBC: 0 % (ref 0.0–0.2)

## 2020-09-12 LAB — COMPREHENSIVE METABOLIC PANEL
ALT: 12 U/L (ref 0–44)
AST: 35 U/L (ref 15–41)
Albumin: 2.8 g/dL — ABNORMAL LOW (ref 3.5–5.0)
Alkaline Phosphatase: 49 U/L (ref 38–126)
Anion gap: 7 (ref 5–15)
BUN: 21 mg/dL (ref 8–23)
CO2: 25 mmol/L (ref 22–32)
Calcium: 8.1 mg/dL — ABNORMAL LOW (ref 8.9–10.3)
Chloride: 103 mmol/L (ref 98–111)
Creatinine, Ser: 1.11 mg/dL — ABNORMAL HIGH (ref 0.44–1.00)
GFR, Estimated: 50 mL/min — ABNORMAL LOW (ref >60)
Glucose, Bld: 112 mg/dL — ABNORMAL HIGH (ref 70–99)
Potassium: 4 mmol/L (ref 3.5–5.1)
Sodium: 135 mmol/L (ref 135–145)
Total Bilirubin: 1.2 mg/dL (ref 0.3–1.2)
Total Protein: 6.1 g/dL — ABNORMAL LOW (ref 6.5–8.1)

## 2020-09-12 MED ORDER — HYDROCODONE-ACETAMINOPHEN 5-325 MG PO TABS: 1.0000 | ORAL_TABLET | Freq: Four times a day (QID) | ORAL | 0 refills | Status: DC | PRN

## 2020-09-12 NOTE — Plan of Care (Signed)

## 2020-09-12 NOTE — Discharge Instructions (Signed)
[facility] _______[facility]  UMBILICAL OR INGUINAL HERNIA REPAIR: POST OP INSTRUCTIONS  Always review your discharge instruction sheet given to you by the facility where your surgery was performed. IF YOU HAVE DISABILITY OR FAMILY LEAVE FORMS, YOU MUST BRING THEM TO THE OFFICE FOR PROCESSING.   DO NOT GIVE THEM TO YOUR DOCTOR.  1. A  prescription for pain medication may be given to you upon discharge.  Take your pain medication as prescribed, if needed.  If narcotic pain medicine is not needed, then you may take acetaminophen (Tylenol) or ibuprofen (Advil) as needed. 2. Take your usually prescribed medications unless otherwise directed. If you need a refill on your pain medication, please contact your pharmacy.  They will contact our office to request authorization. Prescriptions will not be filled after 5 pm or on week-ends. 3. You should follow a light diet the first 24 hours after arrival home, such as soup and crackers, etc.  Be sure to include lots of fluids daily.  Resume your normal diet the day after surgery. 4.Most patients will experience some swelling and bruising around the umbilicus or in the groin and scrotum.  Ice packs and reclining will help.  Swelling and bruising can take several days to resolve.  6. It is common to experience some constipation if taking pain medication after surgery.  Increasing fluid intake and taking a stool softener (such as Colace) will usually help or prevent this problem from occurring.  A mild laxative (Milk of Magnesia or Miralax) should be taken according to package directions if there are no bowel movements after 48 hours. 7. Unless discharge instructions indicate otherwise, you may remove your bandages 24-48 hours after surgery, and you may shower at that time.  You may have steri-strips (small skin tapes) in place directly over the incision.  These strips should be left on the skin for 7-10 days.  If your surgeon used skin glue on the  incision, you may shower in 24 hours.  The glue will flake off over the next 2-3 weeks.  Any sutures or staples will be removed at the office during your follow-up visit. 8. ACTIVITIES:  You may resume regular (light) daily activities beginning the next day--such as daily self-care, walking, climbing stairs--gradually increasing activities as tolerated.  You may have sexual intercourse when it is comfortable.  Refrain from any heavy lifting or straining until approved by your doctor.  a.You may drive when you are no longer taking prescription pain medication, you can comfortably wear a seatbelt, and you can safely maneuver your car and apply brakes. b.RETURN TO WORK:   _____________________________________________  9.You should see your doctor in the office for a follow-up appointment approximately 2-3 weeks after your surgery.  Make sure that you call for this appointment within a day or two after you arrive home to insure a convenient appointment time. 10.OTHER INSTRUCTIONS: _________________________    _____________________________________  WHEN TO CALL YOUR DOCTOR: Fever over 101.0 Inability to urinate Nausea and/or vomiting Extreme swelling or bruising Continued bleeding from incision. Increased pain, redness, or drainage from the incision  The clinic staff is available to answer your questions during regular business hours.  Please don't hesitate to call and ask to speak to one of the nurses for clinical concerns.  If you have a medical emergency, go to the nearest emergency room or call 911.  A surgeon from [facility] is always on call at the hospital   [address], [address],   [place], [place]  [address] ?  [address] [address], [place],    [address] [phone] ? [phone] ? FAX [phone] Web site: [email or web address]  

## 2020-09-12 NOTE — Progress Notes (Signed)
Patient populated yellow MEWS dur to increased HR (see flowsheet). MD Danford called and discussed. Patient still ok to d/c at this time.    09/12/20 1457  Vitals  Temp 98.3 F (36.8 C)  Temp Source Oral  BP 99/66  MAP (mmHg) 76  BP Location Left Arm  BP Method Automatic  Patient Position (if appropriate) Sitting  Pulse Rate (!) 110  Pulse Rate Source Dinamap  Resp 17  MEWS COLOR  MEWS Score Color Yellow  Oxygen Therapy  SpO2 100 %  O2 Device Room Air  Pain Assessment  Pain Scale 0-10  Pain Score 0  PCA/Epidural/Spinal Assessment  Respiratory Pattern Regular;Symmetrical  Glasgow Coma Scale  Eye Opening 4  Best Verbal Response (NON-intubated) 5  Best Motor Response 6  Glasgow Coma Scale Score 15  MEWS Score  MEWS Temp 0  MEWS Systolic 1  MEWS Pulse 1  MEWS RR 0  MEWS LOC 0  MEWS Score 2  Provider Notification  Date Provider Notified 09/12/20  Notification Type Call  Notification Reason Other (Comment) (yellow mEWS/ HR)  Provider response No new orders  Date of Provider Response 09/12/20

## 2020-09-12 NOTE — Discharge Summary (Signed)
Physician Discharge Summary  [name] ASN:[id number] DOB: 1940/08/26 DOA: 09/09/2020  PCP: Redmond School, NP  Admit date: 09/09/2020 Discharge date: 09/12/2020  Admitted From: Home  Disposition:  Home   Recommendations for Outpatient Follow-up:  Follow up with General Surgery as directed Emeline General: Please refer for MCRP abdomen to evaluate dilated pancreatic ducts        Home Health: None  Equipment/Devices: None new  Discharge Condition: Good  CODE STATUS: FULL Diet recommendation: Regular  Brief/Interim Summary: Dominique Ochoa is an 80 y.o. F with HTN, hypothyroidism, asthma, OSA, and hernia who presented with abdominal pain, N/V.   CT showed SBO.  There was attempt at reduction of the hernia, then placement of an NG tube.  She was admitted and surgery were consulted.     PRINCIPAL HOSPITAL DIAGNOSIS: Small bowel obstruction due to bilateral incarcerated large indirect inguinal hernias    Discharge Diagnoses:  Small bowel obstruction due to bilateral incarcerated large indirect inguinal hernias Status post robotic bilateral inguinal hernia repair with mesh on 8/15 by Dr. Derrell Lolling The patient was admitted and an NG tube was placed.  General surgery were consulted and they took her on hospital day 2 for robotic assisted bilateral inguinal hernia repair with mesh and reduction of small bowel obstruction.  Her postop course was unremarkable, uncomplicated, and she was able to pass stool and take oral intake and was cleared for discharge from a surgical standpoint.     Sinus tachycardia Some asymptomatic sinus tachycardia this morning. Afib ruled out.  Return precautions given.  No suspicion for infection.     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  Elective MRCP is reocmmended to better characterize.     Asthma Well controlled   CKD ruled out               Discharge Instructions  Discharge Instructions     Diet - low sodium heart healthy   Complete by: As directed    Discharge instructions   Complete by: As directed    From Dr. Maryfrances Bunnell: You were admitted for a bowel obstruction due to an "incarcerated hernia" or in other words, a piece of intestines that was caught in a hernia  You had a surgery to fix the hernia  This surgery went well Follow the post-operative instructions Dr. Jacinto Halim team gave you and use the pain medicines they gave you as instructed   Go see your primary care physician soon While you were here, you had imaging of your abdomen/belly that showed some wide ducts in your pancreas This is probably nothing, but you should have follow up imaging to make sure it is not something serious. YOu should have an "MRCP of the abdomen" and your PCP can set it up   Increase activity slowly   Complete by: As directed    No wound care   Complete by: As directed    No wound care   Complete by: As directed       Allergies as of 09/12/2020       Reactions   Fish Allergy Other (See Comments)   Makes her weak   Fish Oil Other (See Comments), Swelling   MD orders   Lisinopril Swelling   Facial swelling   Sulfamethoxazole-trimethoprim Itching, Swelling   Facial swelling        Medication List     TAKE these medications  albuterol 108 (90 Base) MCG/ACT inhaler Commonly known as: VENTOLIN HFA Inhale 1 puff into the lungs every 6 (six) hours as needed for wheezing or shortness of breath.   albuterol (2.5 MG/3ML) 0.083% nebulizer solution Commonly known as: PROVENTIL Take 1 vial by nebulization every 6 (six) hours as needed for wheezing or shortness of breath.   amLODipine 5 MG tablet Commonly known as: NORVASC Take 5 mg by mouth daily.   cyanocobalamin 1000 MCG/ML injection Commonly known as: (VITAMIN B-12) Inject 1,000 mcg into the muscle every 30 (thirty) days.    HYDROcodone-acetaminophen 5-325 MG tablet Commonly known as: NORCO/VICODIN Take 1 tablet by mouth every 6 (six) hours as needed for severe pain.   levothyroxine 25 MCG tablet Commonly known as: SYNTHROID Take 25 mcg by mouth daily before breakfast.   montelukast 10 MG tablet Commonly known as: SINGULAIR Take 10 mg by mouth at bedtime.   multivitamin with minerals Tabs tablet Take 1 tablet by mouth daily.   Vitamin D (Ergocalciferol) 1.25 MG (50000 UNIT) Caps capsule Commonly known as: DRISDOL Take 50,000 Units by mouth every Tuesday.        Follow-up Information     Axel Filler, MD Follow up on 09/29/2020.   Specialty: General Surgery Why: 09/29/20 at 9 am. Please bring a copy of your photo ID and insurance card. Please arrive 45 minutes prior to your appointment for paperwork. Contact information: [address] [place] [address] [phone]                Allergies  Allergen Reactions   Fish Allergy Other (See Comments)    Makes her weak   Fish Oil Other (See Comments) and Swelling    MD orders   Lisinopril Swelling    Facial swelling   Sulfamethoxazole-Trimethoprim Itching and Swelling    Facial swelling    Consultations: General Srugery   Procedures/Studies: CT Abdomen Pelvis W Contrast  Result Date: 09/09/2020 CLINICAL DATA:  Diverticulitis suspected Patient reports abdominal pain and vomiting. EXAM: CT ABDOMEN AND PELVIS WITH CONTRAST TECHNIQUE: Multidetector CT imaging of the abdomen and pelvis was performed using the standard protocol following bolus administration of intravenous contrast. CONTRAST:  OMNIPAQUE IOHEXOL 300 MG/ML  SOLN COMPARISON:  CT 04/16/2019 FINDINGS: Lower chest: Minimal lingular and right middle lobe atelectasis. No pleural effusion. Upper normal heart size. Hepatobiliary: No focal hepatic lesion. Post cholecystectomy. Mild intrahepatic biliary ductal dilatation. Common bile duct measures 10 mm, normal for  postcholecystectomy state. Pancreas: Parenchymal atrophy. There is progressive pancreatic ductal dilatation from prior exam, 6 mm in the pancreatic head. No acute peripancreatic inflammation. Small cyst in the pancreatic body measures 10 mm, unchanged from prior, series 10, image 25. There is no obvious obstructing pancreatic mass by CT. Spleen: Normal in size. There are few nonspecific low densities in the upper aspect of the spleen. Adrenals/Urinary Tract: Normal adrenal glands. No hydronephrosis. Multiple subcentimeter bilateral low-density renal cortical lesions are typically cysts. Symmetric renal excretion on delayed phase imaging. The urinary bladder is unremarkable. Stomach/Bowel: The stomach is distended with intraluminal fluid. There may be mild gastric hyperemia and perigastric fat stranding. Dilated fluid-filled small bowel within the left abdomen there is a large left lower quadrant ventral abdominal wall/inguinal hernia containing loops of small bowel as well as colon. Small bowel within and proximal to the hernia are dilated and fluid-filled. No bowel pneumatosis. The exiting small bowel appears decompressed. There is a large right lower quadrant ventral abdominal wall/inguinal hernia  containing small bowel and portions of colon, without obstruction. The appendix is located within this large hernia. There is diffuse colonic diverticulosis without focal diverticulitis. Vascular/Lymphatic: Aortic atherosclerosis and tortuosity. No portal venous or mesenteric gas. Patent portal and mesenteric veins. No bulky abdominopelvic adenopathy. Reproductive: Uterus remains in situ, anteverted. No obvious adnexal mass. Other: Small volume of free fluid in the pelvis. No free air. Large bilateral lower ventral abdominal wall/inguinal hernias containing large and small bowel, causing obstruction on the left. Musculoskeletal: Multilevel degenerative change in the lumbar spine. Grade 1 anterolisthesis of L4 on L5 is  likely facet mediated. There are no acute or suspicious osseous abnormalities. IMPRESSION: 1. Small bowel obstruction secondary to a left lower quadrant ventral abdominal wall/inguinal hernia. Hernia also contains noninflamed colon. 2. Large right ventral abdominal wall/inguinal hernia containing nonobstructed small bowel, colon, and appendix. 3. Progressive pancreatic ductal dilatation from prior exam, 6 mm in the pancreatic head. No obvious obstructing pancreatic mass by CT. Stable appearance of 10 mm pancreatic cyst in the body. Recommend further evaluation with pancreatic protocol MRI/MRCP on an elective basis to exclude the possibility of occult pancreatic head lesion. 4. Colonic diverticulosis without diverticulitis. Aortic Atherosclerosis (ICD10-I70.0). Electronically Signed   By: Narda Rutherford M.D.   On: 09/09/2020 21:39   DG Abd Portable 1V  Result Date: 09/09/2020 CLINICAL DATA:  Check gastric catheter placement EXAM: PORTABLE ABDOMEN - 1 VIEW COMPARISON:  CT from earlier in the same day. FINDINGS: Gastric catheter is noted coiled within the stomach. IMPRESSION: Gastric catheter within the stomach. Electronically Signed   By: Alcide Clever M.D.   On: 09/09/2020 23:06   ECHOCARDIOGRAM COMPLETE  Result Date: 09/11/2020    ECHOCARDIOGRAM REPORT   Patient Name:   Dominique Ochoa Date of Exam: 09/11/2020 Medical Rec #:  [id number]           Height:       64.0 in Accession #:    [id number]          Weight:       132.5 lb Date of Birth:  [birth year]           BSA:          1.642 m Patient Age:    80 years            BP:           145/69 mmHg Patient Gender: F                   HR:           44 bpm. Exam Location:  Inpatient Procedure: 2D Echo, Cardiac Doppler and Color Doppler Indications:    CVA  History:        Patient has prior history of Echocardiogram examinations, most                 recent 10/16/2014. CAD, Carotid Disease; Risk                 Factors:Hypertension, Dyslipidemia, Sleep Apnea and  morbid                 obesity.  Sonographer:    Lavenia Atlas RDCS Referring Phys: [id number] JENNIFER YATES  Sonographer Comments: Patient is morbidly obese. IMPRESSIONS  1. Left ventricular ejection fraction, by estimation, is 65 to 70%. The left ventricle has normal function. The left ventricle has no regional wall motion abnormalities. There is mild concentric left ventricular hypertrophy. Left ventricular diastolic  parameters are consistent with Grade I diastolic dysfunction (impaired relaxation).  2. Right ventricular systolic function is normal. The right ventricular size is normal. There is moderately elevated pulmonary artery systolic pressure.  3. The mitral valve is degenerative. Trivial mitral valve regurgitation. The mean mitral valve gradient is 3.0 mmHg with average heart rate of 97 bpm.  4. The aortic valve is tricuspid. There is mild calcification of the aortic valve. Aortic valve regurgitation is mild.  5. The inferior vena cava is normal in size with greater than 50% respiratory variability, suggesting right atrial pressure of 3 mmHg. Comparison(s): A prior study was performed on 04/04/2018. Prior images reviewed side by side. 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 Frequent PACs noted in this study. FINDINGS  Left Ventricle: Left ventricular ejection fraction, by estimation, is 65 to 70%. The left ventricle has normal function. The left ventricle has no regional wall motion abnormalities. The left ventricular internal cavity size was normal in size. There is  mild concentric left ventricular hypertrophy. Left ventricular diastolic parameters are consistent with Grade I diastolic dysfunction (impaired relaxation). Right Ventricle: The right ventricular size is normal. No increase in right ventricular wall thickness. Right ventricular systolic function is normal. There is moderately elevated pulmonary artery systolic pressure. The tricuspid regurgitant velocity  is 3.33 m/s, and with an assumed right atrial pressure of 3 mmHg, the estimated right ventricular systolic pressure is 47.4 mmHg. Left Atrium: Left atrial size was normal in size. Right Atrium: Right atrial size was normal in size. Pericardium: There is no evidence of pericardial effusion. Mitral Valve: The mitral valve is degenerative in appearance. Trivial mitral valve regurgitation. MV peak gradient, 13.1 mmHg. The mean mitral valve gradient is 3.0 mmHg with average heart rate of 97 bpm. Tricuspid Valve: Mechanism of spectral Doppler flow suggestive of prolapse. The tricuspid valve is grossly normal. Tricuspid valve regurgitation is mild . No evidence of tricuspid stenosis. Aortic Valve: The aortic valve is tricuspid. There is mild calcification of the aortic valve. Aortic valve regurgitation is mild. Aortic regurgitation PHT measures 560 msec. Pulmonic Valve: The pulmonic valve was grossly normal. Pulmonic valve regurgitation is mild. No evidence of pulmonic stenosis. Aorta: The aortic root is normal in size and structure and the ascending aorta was not well visualized. Venous: The inferior vena cava is normal in size with greater than 50% respiratory variability, suggesting right atrial pressure of 3 mmHg. IAS/Shunts: The interatrial septum is aneurysmal. The atrial septum is grossly normal.  LEFT VENTRICLE PLAX 2D LVIDd:         4.20 cm  Diastology LVIDs:         2.80 cm  LV e' medial:    4.68 cm/s LV PW:         1.00 cm  LV E/e' medial:  9.4 LV IVS:        1.10 cm  LV e' lateral:   7.07 cm/s LVOT diam:     2.00 cm  LV E/e' lateral: 6.2 LV SV:         54 LV SV Index:   33 LVOT Area:     3.14 cm  RIGHT VENTRICLE RV Basal diam:  2.60 cm RV S prime:     5.11 cm/s TAPSE (M-mode): 1.6 cm LEFT ATRIUM             Index       RIGHT ATRIUM  Index LA diam:        3.20 cm 1.95 cm/m  RA Area:     15.50 cm LA Vol (A2C):   27.6 ml 16.81 ml/m RA Volume:   41.60 ml  25.33 ml/m LA Vol (A4C):   48.1 ml 29.29 ml/m  LA Biplane Vol: 40.5 ml 24.66 ml/m  AORTIC VALVE LVOT Vmax:   80.50 cm/s LVOT Vmean:  57.500 cm/s LVOT VTI:    0.171 m AI PHT:      560 msec  AORTA Ao Root diam: 2.90 cm MITRAL VALVE               TRICUSPID VALVE MV Area (PHT): 4.46 cm    TR Peak grad:   44.4 mmHg MV Area VTI:   1.93 cm    TR Vmax:        333.00 cm/s MV Peak grad:  13.1 mmHg MV Mean grad:  3.0 mmHg    SHUNTS MV Vmax:       1.81 m/s    Systemic VTI:  0.17 m MV Vmean:      68.9 cm/s   Systemic Diam: 2.00 cm MV Decel Time: 170 msec MV E velocity: 44.10 cm/s MV A velocity: 86.30 cm/s MV E/A ratio:  0.51 Riley Lam MD Electronically signed by Riley Lam MD Signature Date/Time: 09/11/2020/11:14:33 AM    Final       Subjective: No chest pain, dyspnea.  Mild soreness in the abdomen.  Appetite good.  Passing stool.  No confusion.  Discharge Exam: Vitals:   09/12/20 0848 09/12/20 1457  BP: 123/61 99/66  Pulse: (!) 107 (!) 110  Resp: 17 17  Temp: 98.4 F (36.9 C) 98.3 F (36.8 C)  SpO2: 95% 100%   Vitals:   09/12/20 0019 09/12/20 0334 09/12/20 0848 09/12/20 1457  BP: 117/72 131/65 123/61 99/66  Pulse: (!) 106 (!) 108 (!) 107 (!) 110  Resp: Temp: (!) 97.5 F (36.4 C) 98.9 F (37.2 C) 98.4 F (36.9 C) 98.3 F (36.8 C)  TempSrc: Oral Oral Oral Oral  SpO2: 100% 96% 95% 100%  Weight:      Height:        General: Pt is alert, awake, not in acute distress Cardiovascular: Mildly tachycardic this morning, heart rate resolved this afternoon, nl S1-S2, no murmurs appreciated.   No LE edema.   Respiratory: Normal respiratory rate and rhythm.  CTAB without rales or wheezes. Abdominal: Abdomen soft and non-tender.  No distension or HSM.   Neuro/Psych: Strength symmetric in upper and lower extremities.  Judgment and insight appear normal.   The results of significant diagnostics from this hospitalization (including imaging, microbiology, ancillary and laboratory) are listed below for reference.      Microbiology: Recent Results (from the past 240 hour(s))  Resp Panel by RT-PCR (Flu A&B, Covid) Nasopharyngeal Swab     Status: None   Collection Time: 09/09/20 10:28 PM   Specimen: Nasopharyngeal Swab; Nasopharyngeal(NP) swabs in vial transport medium  Result Value Ref Range Status   SARS Coronavirus 2 by RT PCR NEGATIVE NEGATIVE Final    Comment: (NOTE) SARS-CoV-2 target nucleic acids are NOT DETECTED.  The SARS-CoV-2 RNA is generally detectable in upper respiratory specimens during the acute phase of infection. The lowest concentration of SARS-CoV-2 viral copies this assay can detect is 138 copies/mL. A negative result does not preclude SARS-Cov-2 infection and should not be used as the sole basis for treatment or other patient management  decisions. A negative result may occur with  improper specimen collection/handling, submission of specimen other than nasopharyngeal swab, presence of viral mutation(s) within the areas targeted by this assay, and inadequate number of viral copies(<138 copies/mL). A negative result must be combined with clinical observations, patient history, and epidemiological information. The expected result is Negative.  Fact Sheet for Patients:  BloggerCourse.comhttps://www.fda.gov/media/152166/download  Fact Sheet for Healthcare Providers:  SeriousBroker.ithttps://www.fda.gov/media/152162/download  This test is no t yet approved or cleared by the [place] FDA and  has been authorized for detection and/or diagnosis of SARS-CoV-2 by FDA under an Emergency Use Authorization (EUA). This EUA will remain  in effect (meaning this test can be used) for the duration of the COVID-19 declaration under Section 564(b)(1) of the Act, 21 U.S.C.section 360bbb-3(b)(1), unless the authorization is terminated  or revoked sooner.       Influenza A by PCR NEGATIVE NEGATIVE Final   Influenza B by PCR NEGATIVE NEGATIVE Final    Comment: (NOTE) The Xpert Xpress SARS-CoV-2/FLU/RSV plus assay is  intended as an aid in the diagnosis of influenza from Nasopharyngeal swab specimens and should not be used as a sole basis for treatment. Nasal washings and aspirates are unacceptable for Xpert Xpress SARS-CoV-2/FLU/RSV testing.  Fact Sheet for Patients: BloggerCourse.comhttps://www.fda.gov/media/152166/download  Fact Sheet for Healthcare Providers: SeriousBroker.ithttps://www.fda.gov/media/152162/download  This test is not yet approved or cleared by the [place] FDA and has been authorized for detection and/or diagnosis of SARS-CoV-2 by FDA under an Emergency Use Authorization (EUA). This EUA will remain in effect (meaning this test can be used) for the duration of the COVID-19 declaration under Section 564(b)(1) of the Act, 21 U.S.C. section 360bbb-3(b)(1), unless the authorization is terminated or revoked.  Performed at [facility], [address] [place], [place] [id number]      Labs: BNP (last 3 results) No results for input(s): BNP in the last 8760 hours. Basic Metabolic Panel: Recent Labs  Lab 09/09/20 1925 09/11/20 0325 09/12/20 0740  NA 138 136 135  K 3.6 3.3* 4.0  CL 100 103 103  CO2 26 23 25   GLUCOSE 136* 118* 112*  BUN 18 14 21   CREATININE 0.93 0.87 1.11*  CALCIUM 9.6 8.9 8.1*   Liver Function Tests: Recent Labs  Lab 09/09/20 1925 09/12/20 0740  AST 32 35  ALT 22 12  ALKPHOS 74 49  BILITOT 0.7 1.2  PROT 8.8* 6.1*  ALBUMIN 4.2 2.8*   Recent Labs  Lab 09/09/20 1925  LIPASE 39   No results for input(s): AMMONIA in the last 168 hours. CBC: Recent Labs  Lab 09/09/20 1925 09/11/20 0325 09/12/20 0740  WBC 7.4 6.7 7.9  NEUTROABS 5.6  --   --   HGB 14.3 13.7 12.0  HCT 43.9 41.7 36.2  MCV 83.5 84.6 84.2  PLT 291 278 224   Cardiac Enzymes: No results for input(s): CKTOTAL, CKMB, CKMBINDEX, TROPONINI in the last 168 hours. BNP: Invalid input(s): POCBNP CBG: No results for input(s): GLUCAP in the last 168 hours. D-Dimer No results for input(s): DDIMER  in the last 72 hours. Hgb A1c No results for input(s): HGBA1C in the last 72 hours. Lipid Profile No results for input(s): CHOL, HDL, LDLCALC, TRIG, CHOLHDL, LDLDIRECT in the last 72 hours. Thyroid function studies No results for input(s): TSH, T4TOTAL, T3FREE, THYROIDAB in the last 72 hours.  Invalid input(s): FREET3 Anemia work up No results for input(s): VITAMINB12, FOLATE, FERRITIN, TIBC, IRON, RETICCTPCT in the last 72 hours. Urinalysis  Component Value Date/Time   COLORURINE YELLOW 09/09/2020 1925   APPEARANCEUR CLEAR 09/09/2020 1925   LABSPEC 1.020 09/09/2020 1925   PHURINE 7.0 09/09/2020 1925   GLUCOSEU NEGATIVE 09/09/2020 1925   HGBUR NEGATIVE 09/09/2020 1925   BILIRUBINUR NEGATIVE 09/09/2020 1925   KETONESUR 15 (A) 09/09/2020 1925   PROTEINUR 30 (A) 09/09/2020 1925   UROBILINOGEN 1.0 12/03/2013 2108   NITRITE NEGATIVE 09/09/2020 1925   LEUKOCYTESUR SMALL (A) 09/09/2020 1925   Sepsis Labs Invalid input(s): PROCALCITONIN,  WBC,  LACTICIDVEN Microbiology Recent Results (from the past 240 hour(s))  Resp Panel by RT-PCR (Flu A&B, Covid) Nasopharyngeal Swab     Status: None   Collection Time: 09/09/20 10:28 PM   Specimen: Nasopharyngeal Swab; Nasopharyngeal(NP) swabs in vial transport medium  Result Value Ref Range Status   SARS Coronavirus 2 by RT PCR NEGATIVE NEGATIVE Final    Comment: (NOTE) SARS-CoV-2 target nucleic acids are NOT DETECTED.  The SARS-CoV-2 RNA is generally detectable in upper respiratory specimens during the acute phase of infection. The lowest concentration of SARS-CoV-2 viral copies this assay can detect is 138 copies/mL. A negative result does not preclude SARS-Cov-2 infection and should not be used as the sole basis for treatment or other patient management decisions. A negative result may occur with  improper specimen collection/handling, submission of specimen other than nasopharyngeal swab, presence of viral mutation(s) within the areas  targeted by this assay, and inadequate number of viral copies(<138 copies/mL). A negative result must be combined with clinical observations, patient history, and epidemiological information. The expected result is Negative.  Fact Sheet for Patients:  BloggerCourse.com  Fact Sheet for Healthcare Providers:  SeriousBroker.it  This test is no t yet approved or cleared by the [place] FDA and  has been authorized for detection and/or diagnosis of SARS-CoV-2 by FDA under an Emergency Use Authorization (EUA). This EUA will remain  in effect (meaning this test can be used) for the duration of the COVID-19 declaration under Section 564(b)(1) of the Act, 21 U.S.C.section 360bbb-3(b)(1), unless the authorization is terminated  or revoked sooner.       Influenza A by PCR NEGATIVE NEGATIVE Final   Influenza B by PCR NEGATIVE NEGATIVE Final    Comment: (NOTE) The Xpert Xpress SARS-CoV-2/FLU/RSV plus assay is intended as an aid in the diagnosis of influenza from Nasopharyngeal swab specimens and should not be used as a sole basis for treatment. Nasal washings and aspirates are unacceptable for Xpert Xpress SARS-CoV-2/FLU/RSV testing.  Fact Sheet for Patients: BloggerCourse.com  Fact Sheet for Healthcare Providers: SeriousBroker.it  This test is not yet approved or cleared by the [place] FDA and has been authorized for detection and/or diagnosis of SARS-CoV-2 by FDA under an Emergency Use Authorization (EUA). This EUA will remain in effect (meaning this test can be used) for the duration of the COVID-19 declaration under Section 564(b)(1) of the Act, 21 U.S.C. section 360bbb-3(b)(1), unless the authorization is terminated or revoked.  Performed at [facility], [address] [place], [place] [address]      Time coordinating discharge: 25 minutes The [place]  controlled substances registry was reviewed for this patient    30 Day Unplanned Readmission Risk Score    Flowsheet Row ED to Hosp-Admission (Current) from 09/09/2020 in [facility]  SURGICAL  30 Day Unplanned Readmission Risk Score (%) 9.25 Filed at 09/12/2020 1200       This score is the patient's risk of an unplanned  readmission within 30 days of being discharged (0 -100%). The score is based on dignosis, age, lab data, medications, orders, and past utilization.   Low:  0-14.9   Medium: 15-21.9   High: 22-29.9   Extreme: 30 and above            SIGNED:   Alberteen Sam, MD  [facility] 09/12/2020, 3:49 PM

## 2020-09-12 NOTE — Progress Notes (Signed)
1 Day Post-Op  Subjective: CC: Doing well. Pain mainly over incision that is well controlled. Tolerating cld without n/v. Trying regular diet for breakfast. Had 2 bm's prior to surgery yesterday. No flatus since surgery. Voided since surgery. Mobilizing to the bathroom post op. PT on the schedule to work with her today. Lives at home with her son and his wife.   Objective: Vital signs in last 24 hours: Temp:  [97.2 F (36.2 C)-98.9 F (37.2 C)] 98.9 F (37.2 C) (08/16 0334) Pulse Rate:  [88-108] 108 (08/16 0334) Resp:  [12-20] 18 (08/16 0334) BP: (115-144)/(65-86) 131/65 (08/16 0334) SpO2:  [93 %-100 %] 96 % (08/16 0334) Last BM Date:  (Pt uanble to remember)  Intake/Output from previous day: 08/15 0701 - 08/16 0700 In: 2685.5 [P.O.:175; I.V.:2410.5; IV Piggyback:100] Out: 20 [Blood:20] Intake/Output this shift: No intake/output data recorded.  PE: Gen:  Alert, NAD, pleasant HEENT: EOM's intact, pupils equal and round Card:  Tachycardic with regular rhythm Pulm:  CTAB, no W/R/R, effort normal Abd: Soft, ND, tenderness around the incisions that appear appropriate. Otherwise NT. +BS, Incisions with glue intact appears well and are without drainage, bleeding, or signs of infection Ext:  No LE edema or calf tenderness Psych: A&Ox3  Skin: no rashes noted, warm and dry  Lab Results:  Recent Labs    09/09/20 1925 09/11/20 0325  WBC 7.4 6.7  HGB 14.3 13.7  HCT 43.9 41.7  PLT 291 278   BMET Recent Labs    09/09/20 1925 09/11/20 0325  NA 138 136  K 3.6 3.3*  CL 100 103  CO2 26 23  GLUCOSE 136* 118*  BUN 18 14  CREATININE 0.93 0.87  CALCIUM 9.6 8.9   PT/INR No results for input(s): LABPROT, INR in the last 72 hours. CMP     Component Value Date/Time   NA 136 09/11/2020 0325   K 3.3 (L) 09/11/2020 0325   CL 103 09/11/2020 0325   CO2 23 09/11/2020 0325   GLUCOSE 118 (H) 09/11/2020 0325   BUN 14 09/11/2020 0325   CREATININE 0.87 09/11/2020 0325   CALCIUM  8.9 09/11/2020 0325   PROT 8.8 (H) 09/09/2020 1925   ALBUMIN 4.2 09/09/2020 1925   AST 32 09/09/2020 1925   ALT 22 09/09/2020 1925   ALKPHOS 74 09/09/2020 1925   BILITOT 0.7 09/09/2020 1925   GFRNONAA >60 09/11/2020 0325   GFRAA >60 10/21/2019 0907   Lipase     Component Value Date/Time   LIPASE 39 09/09/2020 1925    Studies/Results: ECHOCARDIOGRAM COMPLETE  Result Date: 09/11/2020    ECHOCARDIOGRAM REPORT   Patient Name:   Dominique Ochoa Date of Exam: 09/11/2020 Medical Rec #:  [id number]           Height:       64.0 in Accession #:    [id number]          Weight:       132.5 lb Date of Birth:  1940-06-11           BSA:          1.642 m Patient Age:    80 years            BP:           145/69 mmHg Patient Gender: F                   HR:  44 bpm. Exam Location:  Inpatient Procedure: 2D Echo, Cardiac Doppler and Color Doppler Indications:    CVA  History:        Patient has prior history of Echocardiogram examinations, most                 recent 10/16/2014. CAD, Carotid Disease; Risk                 Factors:Hypertension, Dyslipidemia, Sleep Apnea and morbid                 obesity.  Sonographer:    Lavenia Atlas RDCS Referring Phys: [id number] JENNIFER YATES  Sonographer Comments: Patient is morbidly obese. IMPRESSIONS  1. Left ventricular ejection fraction, by estimation, is 65 to 70%. The left ventricle has normal function. The left ventricle has no regional wall motion abnormalities. There is mild concentric left ventricular hypertrophy. Left ventricular diastolic parameters are consistent with Grade I diastolic dysfunction (impaired relaxation).  2. Right ventricular systolic function is normal. The right ventricular size is normal. There is moderately elevated pulmonary artery systolic pressure.  3. The mitral valve is degenerative. Trivial mitral valve regurgitation. The mean mitral valve gradient is 3.0 mmHg with average heart rate of 97 bpm.  4. The aortic valve is tricuspid. There  is mild calcification of the aortic valve. Aortic valve regurgitation is mild.  5. The inferior vena cava is normal in size with greater than 50% respiratory variability, suggesting right atrial pressure of 3 mmHg. Comparison(s): A prior study was performed on 04/04/2018. Prior images reviewed side by side. 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 Frequent PACs noted in this study. FINDINGS  Left Ventricle: Left ventricular ejection fraction, by estimation, is 65 to 70%. The left ventricle has normal function. The left ventricle has no regional wall motion abnormalities. The left ventricular internal cavity size was normal in size. There is  mild concentric left ventricular hypertrophy. Left ventricular diastolic parameters are consistent with Grade I diastolic dysfunction (impaired relaxation). Right Ventricle: The right ventricular size is normal. No increase in right ventricular wall thickness. Right ventricular systolic function is normal. There is moderately elevated pulmonary artery systolic pressure. The tricuspid regurgitant velocity is 3.33 m/s, and with an assumed right atrial pressure of 3 mmHg, the estimated right ventricular systolic pressure is 47.4 mmHg. Left Atrium: Left atrial size was normal in size. Right Atrium: Right atrial size was normal in size. Pericardium: There is no evidence of pericardial effusion. Mitral Valve: The mitral valve is degenerative in appearance. Trivial mitral valve regurgitation. MV peak gradient, 13.1 mmHg. The mean mitral valve gradient is 3.0 mmHg with average heart rate of 97 bpm. Tricuspid Valve: Mechanism of spectral Doppler flow suggestive of prolapse. The tricuspid valve is grossly normal. Tricuspid valve regurgitation is mild . No evidence of tricuspid stenosis. Aortic Valve: The aortic valve is tricuspid. There is mild calcification of the aortic valve. Aortic valve regurgitation is mild. Aortic regurgitation PHT measures 560  msec. Pulmonic Valve: The pulmonic valve was grossly normal. Pulmonic valve regurgitation is mild. No evidence of pulmonic stenosis. Aorta: The aortic root is normal in size and structure and the ascending aorta was not well visualized. Venous: The inferior vena cava is normal in size with greater than 50% respiratory variability, suggesting right atrial pressure of 3 mmHg. IAS/Shunts: The interatrial septum is aneurysmal. The atrial septum is grossly normal.  LEFT VENTRICLE PLAX 2D LVIDd:  4.20 cm  Diastology LVIDs:         2.80 cm  LV e' medial:    4.68 cm/s LV PW:         1.00 cm  LV E/e' medial:  9.4 LV IVS:        1.10 cm  LV e' lateral:   7.07 cm/s LVOT diam:     2.00 cm  LV E/e' lateral: 6.2 LV SV:         54 LV SV Index:   33 LVOT Area:     3.14 cm  RIGHT VENTRICLE RV Basal diam:  2.60 cm RV S prime:     5.11 cm/s TAPSE (M-mode): 1.6 cm LEFT ATRIUM             Index       RIGHT ATRIUM           Index LA diam:        3.20 cm 1.95 cm/m  RA Area:     15.50 cm LA Vol (A2C):   27.6 ml 16.81 ml/m RA Volume:   41.60 ml  25.33 ml/m LA Vol (A4C):   48.1 ml 29.29 ml/m LA Biplane Vol: 40.5 ml 24.66 ml/m  AORTIC VALVE LVOT Vmax:   80.50 cm/s LVOT Vmean:  57.500 cm/s LVOT VTI:    0.171 m AI PHT:      560 msec  AORTA Ao Root diam: 2.90 cm MITRAL VALVE               TRICUSPID VALVE MV Area (PHT): 4.46 cm    TR Peak grad:   44.4 mmHg MV Area VTI:   1.93 cm    TR Vmax:        333.00 cm/s MV Peak grad:  13.1 mmHg MV Mean grad:  3.0 mmHg    SHUNTS MV Vmax:       1.81 m/s    Systemic VTI:  0.17 m MV Vmean:      68.9 cm/s   Systemic Diam: 2.00 cm MV Decel Time: 170 msec MV E velocity: 44.10 cm/s MV A velocity: 86.30 cm/s MV E/A ratio:  0.51 Riley Lam MD Electronically signed by Riley Lam MD Signature Date/Time: 09/11/2020/11:14:33 AM    Final     Anti-infectives: Anti-infectives (From admission, onward)    Start     Dose/Rate Route Frequency Ordered Stop   09/11/20 1000  ceFAZolin  (ANCEF) IVPB 2g/100 mL premix  Status:  Discontinued        2 g 200 mL/hr over 30 Minutes Intravenous On call to O.R. 09/11/20 0908 09/11/20 0944   09/11/20 0945  ceFAZolin (ANCEF) IVPB 2g/100 mL premix        2 g 200 mL/hr over 30 Minutes Intravenous On call to O.R. 09/11/20 0941 09/11/20 1113        Assessment/Plan POD 1 s/p robotic assisted BIH repair with mesh - Dr. Derrell Lolling - 09/11/2020 - Adv diet  - PT eval pending - Labs pending - Will arrange follow up and send pain meds to her pharmacy. If patient tolerates diet and is cleared by PT, okay for d/c from our standpoint. Discussed with TRH  FEN - Reg VTE - SCDs, Lovenox ID - Ancef periop    LOS: 2 days    Jacinto Halim , [facility] 09/12/2020, 7:58 AM Please see Amion for pager number during day hours 7:00am-4:30pm

## 2020-09-12 NOTE — Progress Notes (Signed)
Discharge orders received. Printed AVS forms and reviewed with patient; verbalized understanding. PIVx2 removed. All patient belongings at bedside. Awaiting ride at this time.

## 2020-09-12 NOTE — Progress Notes (Signed)
[facility] PROGRESS NOTE    Dominique Ochoa  VEL:[id number] DOB: [birth year] DOA: 09/09/2020 PCP: Redmond School, NP      Brief Narrative:  Dominique Ochoa is a 80 y.o. F with HTN, hypothyroidism, asthma, OSA, and hernia who presented with abdominal pain, N/V.  CT showed SBO.  There was attempt at reduction of the hernia, then placement of an NG tube.  She was admitted and surgery were consulted.         Assessment & Plan:  Small bowel obstruction due to bilateral incarcerated large indirect inguinal hernias Status post robotic bilateral inguinal hernia repair with mesh on 8/15 by Dr. Derrell Lolling - Postop pain control and diet advancement per surgery  - Resume Lovenox this evening if Hgb stable    Sinus tachycardia HR low 100s after surgery.  ECG this morning shows sinus tach, some PACs.  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    Hypertension BP controlled - Continue amlodipine   Hypokalemia Repeat K pending this AM  Hypothyroidism - Continue levothyroxine     Pancreatic head ductal dillation, incidetnal finding - Elective MRCP post-discharge  Asthma Well controlled  CKD ruled out           Disposition: Status is: Inpatient  Remains inpatient appropriate because:IV treatments appropriate due to intensity of illness or inability to take PO  Dispo: The patient is from: Home              Anticipated d/c is to: Home              Patient currently is not medically stable to d/c.   Difficult to place patient No       Level of care: Telemetry Surgical       MDM: The below labs and imaging reports were reviewed and summarized above.  Medication management as above.    DVT prophylaxis: enoxaparin (LOVENOX) injection 40 mg Start: 09/12/20 0800 SCD's Start: 09/11/20 1401 SCDs Start: 09/10/20 1213  Code Status: Full code         Subjective: Presented with some soreness, but no abdominal distention, vomiting, nausea.  No  chest pain, dyspnea.  No fever.  Objective: Vitals:   09/11/20 1645 09/11/20 2011 09/12/20 0019 09/12/20 0334  BP: 115/79 128/83 117/72 131/65  Pulse: 100 (!) 101 (!) 106 (!) 108  Resp: 20 18 18 18   Temp: 98.5 F (36.9 C) 97.9 F (36.6 C) (!) 97.5 F (36.4 C) 98.9 F (37.2 C)  TempSrc: Oral Oral Oral Oral  SpO2: 100% 93% 100% 96%  Weight:      Height:        Intake/Output Summary (Last 24 hours) at 09/12/2020 0730 Last data filed at 09/12/2020 0500 Gross per 24 hour  Intake 2685.47 ml  Output 20 ml  Net 2665.47 ml   Filed Weights   09/09/20 1851 09/10/20 1115  Weight: 63 kg 60.1 kg    Examination: General appearance: Elderly adult female, lying in bed, appears comfortable.     HEENT:    Skin:  Cardiac: Tachycardic, no murmurs appreciated, no ST edema, JVP normal Respiratory: Normal respiratory rate and rhythm without rales or wheezes Abdomen: Abdomen with voluntary guarding throughout, tenderness palpation throughout, no masses appreciated, no distention or ascites MSK: Normal muscle bulk and tone. Neuro: Awake and alert, face metric, moves upper extremities with normal strength and coordination, speech fluent Psych: Sensorium intact and responding to questions, attention normal, affect pleasant, judgment insight appear normal, oriented to person, place, time,  and situation     Data Reviewed: I have personally reviewed following labs and imaging studies:  CBC: Recent Labs  Lab 09/09/20 1925 09/11/20 0325  WBC 7.4 6.7  NEUTROABS 5.6  --   HGB 14.3 13.7  HCT 43.9 41.7  MCV 83.5 84.6  PLT 291 278   Basic Metabolic Panel: Recent Labs  Lab 09/09/20 1925 09/11/20 0325  NA 138 136  K 3.6 3.3*  CL 100 103  CO2 26 23  GLUCOSE 136* 118*  BUN 18 14  CREATININE 0.93 0.87  CALCIUM 9.6 8.9   GFR: Estimated Creatinine Clearance: 43.1 mL/min (by C-G formula based on SCr of 0.87 mg/dL). Liver Function Tests: Recent Labs  Lab 09/09/20 1925  AST 32  ALT 22   ALKPHOS 74  BILITOT 0.7  PROT 8.8*  ALBUMIN 4.2   Recent Labs  Lab 09/09/20 1925  LIPASE 39   No results for input(s): AMMONIA in the last 168 hours. Coagulation Profile: No results for input(s): INR, PROTIME in the last 168 hours. Cardiac Enzymes: No results for input(s): CKTOTAL, CKMB, CKMBINDEX, TROPONINI in the last 168 hours. BNP (last 3 results) No results for input(s): PROBNP in the last 8760 hours. HbA1C: No results for input(s): HGBA1C in the last 72 hours. CBG: No results for input(s): GLUCAP in the last 168 hours. Lipid Profile: No results for input(s): CHOL, HDL, LDLCALC, TRIG, CHOLHDL, LDLDIRECT in the last 72 hours. Thyroid Function Tests: No results for input(s): TSH, T4TOTAL, FREET4, T3FREE, THYROIDAB in the last 72 hours. Anemia Panel: No results for input(s): VITAMINB12, FOLATE, FERRITIN, TIBC, IRON, RETICCTPCT in the last 72 hours. Urine analysis:    Component Value Date/Time   COLORURINE YELLOW 09/09/2020 1925   APPEARANCEUR CLEAR 09/09/2020 1925   LABSPEC 1.020 09/09/2020 1925   PHURINE 7.0 09/09/2020 1925   GLUCOSEU NEGATIVE 09/09/2020 1925   HGBUR NEGATIVE 09/09/2020 1925   BILIRUBINUR NEGATIVE 09/09/2020 1925   KETONESUR 15 (A) 09/09/2020 1925   PROTEINUR 30 (A) 09/09/2020 1925   UROBILINOGEN 1.0 12/03/2013 2108   NITRITE NEGATIVE 09/09/2020 1925   LEUKOCYTESUR SMALL (A) 09/09/2020 1925   Sepsis Labs: @LABRCNTIP (procalcitonin:4,lacticacidven:4)  ) Recent Results (from the past 240 hour(s))  Resp Panel by RT-PCR (Flu A&B, Covid) Nasopharyngeal Swab     Status: None   Collection Time: 09/09/20 10:28 PM   Specimen: Nasopharyngeal Swab; Nasopharyngeal(NP) swabs in vial transport medium  Result Value Ref Range Status   SARS Coronavirus 2 by RT PCR NEGATIVE NEGATIVE Final    Comment: (NOTE) SARS-CoV-2 target nucleic acids are NOT DETECTED.  The SARS-CoV-2 RNA is generally detectable in upper respiratory specimens during the acute phase of  infection. The lowest concentration of SARS-CoV-2 viral copies this assay can detect is 138 copies/mL. A negative result does not preclude SARS-Cov-2 infection and should not be used as the sole basis for treatment or other patient management decisions. A negative result may occur with  improper specimen collection/handling, submission of specimen other than nasopharyngeal swab, presence of viral mutation(s) within the areas targeted by this assay, and inadequate number of viral copies(<138 copies/mL). A negative result must be combined with clinical observations, patient history, and epidemiological information. The expected result is Negative.  Fact Sheet for Patients:  BloggerCourse.comhttps://www.fda.gov/media/152166/download  Fact Sheet for Healthcare Providers:  SeriousBroker.ithttps://www.fda.gov/media/152162/download  This test is no t yet approved or cleared by the [place] FDA and  has been authorized for detection and/or diagnosis of SARS-CoV-2 by FDA under an Emergency Use Authorization (EUA).  This EUA will remain  in effect (meaning this test can be used) for the duration of the COVID-19 declaration under Section 564(b)(1) of the Act, 21 U.S.C.section 360bbb-3(b)(1), unless the authorization is terminated  or revoked sooner.       Influenza A by PCR NEGATIVE NEGATIVE Final   Influenza B by PCR NEGATIVE NEGATIVE Final    Comment: (NOTE) The Xpert Xpress SARS-CoV-2/FLU/RSV plus assay is intended as an aid in the diagnosis of influenza from Nasopharyngeal swab specimens and should not be used as a sole basis for treatment. Nasal washings and aspirates are unacceptable for Xpert Xpress SARS-CoV-2/FLU/RSV testing.  Fact Sheet for Patients: BloggerCourse.com  Fact Sheet for Healthcare Providers: SeriousBroker.it  This test is not yet approved or cleared by the [place] FDA and has been authorized for detection and/or diagnosis of SARS-CoV-2  by FDA under an Emergency Use Authorization (EUA). This EUA will remain in effect (meaning this test can be used) for the duration of the COVID-19 declaration under Section 564(b)(1) of the Act, 21 U.S.C. section 360bbb-3(b)(1), unless the authorization is terminated or revoked.  Performed at [facility], [address] [place], [place] [address]          Radiology Studies: ECHOCARDIOGRAM COMPLETE  Result Date: 09/11/2020    ECHOCARDIOGRAM REPORT   Patient Name:   Charlyne Mom Date of Exam: 09/11/2020 Medical Rec #:  [id number]           Height:       64.0 in Accession #:    [id number]          Weight:       132.5 lb Date of Birth:  07-31-1940           BSA:          1.642 m Patient Age:    80 years            BP:           145/69 mmHg Patient Gender: F                   HR:           44 bpm. Exam Location:  Inpatient Procedure: 2D Echo, Cardiac Doppler and Color Doppler Indications:    CVA  History:        Patient has prior history of Echocardiogram examinations, most                 recent 10/16/2014. CAD, Carotid Disease; Risk                 Factors:Hypertension, Dyslipidemia, Sleep Apnea and morbid                 obesity.  Sonographer:    Lavenia Atlas RDCS Referring Phys: [id number] JENNIFER YATES  Sonographer Comments: Patient is morbidly obese. IMPRESSIONS  1. Left ventricular ejection fraction, by estimation, is 65 to 70%. The left ventricle has normal function. The left ventricle has no regional wall motion abnormalities. There is mild concentric left ventricular hypertrophy. Left ventricular diastolic parameters are consistent with Grade I diastolic dysfunction (impaired relaxation).  2. Right ventricular systolic function is normal. The right ventricular size is normal. There is moderately elevated pulmonary artery systolic pressure.  3. The mitral valve is degenerative. Trivial mitral valve regurgitation. The mean mitral valve gradient is 3.0 mmHg with average heart  rate of 97 bpm.  4. The aortic valve is tricuspid. There is  mild calcification of the aortic valve. Aortic valve regurgitation is mild.  5. The inferior vena cava is normal in size with greater than 50% respiratory variability, suggesting right atrial pressure of 3 mmHg. Comparison(s): A prior study was performed on 04/04/2018. Prior images reviewed side by side. 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 Frequent PACs noted in this study. FINDINGS  Left Ventricle: Left ventricular ejection fraction, by estimation, is 65 to 70%. The left ventricle has normal function. The left ventricle has no regional wall motion abnormalities. The left ventricular internal cavity size was normal in size. There is  mild concentric left ventricular hypertrophy. Left ventricular diastolic parameters are consistent with Grade I diastolic dysfunction (impaired relaxation). Right Ventricle: The right ventricular size is normal. No increase in right ventricular wall thickness. Right ventricular systolic function is normal. There is moderately elevated pulmonary artery systolic pressure. The tricuspid regurgitant velocity is 3.33 m/s, and with an assumed right atrial pressure of 3 mmHg, the estimated right ventricular systolic pressure is 47.4 mmHg. Left Atrium: Left atrial size was normal in size. Right Atrium: Right atrial size was normal in size. Pericardium: There is no evidence of pericardial effusion. Mitral Valve: The mitral valve is degenerative in appearance. Trivial mitral valve regurgitation. MV peak gradient, 13.1 mmHg. The mean mitral valve gradient is 3.0 mmHg with average heart rate of 97 bpm. Tricuspid Valve: Mechanism of spectral Doppler flow suggestive of prolapse. The tricuspid valve is grossly normal. Tricuspid valve regurgitation is mild . No evidence of tricuspid stenosis. Aortic Valve: The aortic valve is tricuspid. There is mild calcification of the aortic valve. Aortic valve  regurgitation is mild. Aortic regurgitation PHT measures 560 msec. Pulmonic Valve: The pulmonic valve was grossly normal. Pulmonic valve regurgitation is mild. No evidence of pulmonic stenosis. Aorta: The aortic root is normal in size and structure and the ascending aorta was not well visualized. Venous: The inferior vena cava is normal in size with greater than 50% respiratory variability, suggesting right atrial pressure of 3 mmHg. IAS/Shunts: The interatrial septum is aneurysmal. The atrial septum is grossly normal.  LEFT VENTRICLE PLAX 2D LVIDd:         4.20 cm  Diastology LVIDs:         2.80 cm  LV e' medial:    4.68 cm/s LV PW:         1.00 cm  LV E/e' medial:  9.4 LV IVS:        1.10 cm  LV e' lateral:   7.07 cm/s LVOT diam:     2.00 cm  LV E/e' lateral: 6.2 LV SV:         54 LV SV Index:   33 LVOT Area:     3.14 cm  RIGHT VENTRICLE RV Basal diam:  2.60 cm RV S prime:     5.11 cm/s TAPSE (M-mode): 1.6 cm LEFT ATRIUM             Index       RIGHT ATRIUM           Index LA diam:        3.20 cm 1.95 cm/m  RA Area:     15.50 cm LA Vol (A2C):   27.6 ml 16.81 ml/m RA Volume:   41.60 ml  25.33 ml/m LA Vol (A4C):   48.1 ml 29.29 ml/m LA Biplane Vol: 40.5 ml 24.66 ml/m  AORTIC VALVE LVOT Vmax:   80.50 cm/s LVOT  Vmean:  57.500 cm/s LVOT VTI:    0.171 m AI PHT:      560 msec  AORTA Ao Root diam: 2.90 cm MITRAL VALVE               TRICUSPID VALVE MV Area (PHT): 4.46 cm    TR Peak grad:   44.4 mmHg MV Area VTI:   1.93 cm    TR Vmax:        333.00 cm/s MV Peak grad:  13.1 mmHg MV Mean grad:  3.0 mmHg    SHUNTS MV Vmax:       1.81 m/s    Systemic VTI:  0.17 m MV Vmean:      68.9 cm/s   Systemic Diam: 2.00 cm MV Decel Time: 170 msec MV E velocity: 44.10 cm/s MV A velocity: 86.30 cm/s MV E/A ratio:  0.51 Riley Lam MD Electronically signed by Riley Lam MD Signature Date/Time: 09/11/2020/11:14:33 AM    Final         Scheduled Meds:  amLODipine  5 mg Oral Daily   enoxaparin (LOVENOX)  injection  40 mg Subcutaneous Q24H   levothyroxine  25 mcg Oral Q0600   Continuous Infusions:  dextrose 5 % and 0.9% NaCl 75 mL/hr at 09/11/20 2004   lactated ringers 75 mL/hr at 09/10/20 1309     LOS: 2 days    Time spent: 25  minutes    Alberteen Sam, MD [facility] 09/12/2020, 7:30 AM     Please page though AMION or Epic secure chat:  For [employer], Higher education careers adviser

## 2020-09-12 NOTE — Progress Notes (Signed)
Tech escorted patient to ride via wheelchair. Patient has all personal belongings.

## 2020-09-12 NOTE — Evaluation (Signed)
Physical Therapy Evaluation Patient Details Name: Dominique Ochoa MRN: [id number] DOB: [birth year] Today's Date: 09/12/2020   History of Present Illness  80 y.o. female admitted on 09/09/20 for abdominal pain, N/V.  Pt found to have bowel obstruction due to bil inguinal hernia s/p bil inguinal hernia repair with mesh on 09/11/20.  Pt with significant PMH of CKD II, HTN, asthma, normocytic anemia, PSVT, ventral hernia with bowel obstruction (04/16/19).  Clinical Impression  Pt was able to mobilize a good distance down the hallway, get up and down out of bed, and practice stairs simulating home entry all at min to min guard assist level.  She has the support of her family at discharge (son works from home and lives with her) and is safe to d/c when MD deems medically appropriate.   PT to follow acutely for deficits listed below.   PT to follow acutely until d/c confirmed.       Follow Up Recommendations No PT follow up    Equipment Recommendations  None recommended by PT    Recommendations for Other Services       Precautions / Restrictions Precautions Precautions: Other (comment) Precaution Comments: abdominal Required Braces or Orthoses: Other Brace Other Brace: abdominal binder Restrictions Other Position/Activity Restrictions: abdominal precautions      Mobility  Bed Mobility Overal bed mobility: Needs Assistance Bed Mobility: Rolling;Sidelying to Sit Rolling: Min assist Sidelying to sit: Min assist       General bed mobility comments: Min assist with cues for log roll technique, HOB flat, no rails coming out of the right side of the bed to simulate her bed at home.    Transfers Overall transfer level: Needs assistance Equipment used: 1 person hand held assist Transfers: Sit to/from Stand Sit to Stand: Min guard         General transfer comment: Min guard assist for safety initially, after getting her feet under her she did not need my assist for further  transitions, did rely on hands for support during transitions.  Ambulation/Gait Ambulation/Gait assistance: Min guard Gait Distance (Feet): 200 Feet Assistive device: 1 person hand held assist;None Gait Pattern/deviations: Step-through pattern   Gait velocity interpretation: 1.31 - 2.62 ft/sec, indicative of limited community ambulator General Gait Details: Pt initially walking with min guard hand held assist, but transitioned to close supervision after ~100'.  Slow, but steady gait, no staggers or LOB.  Stairs Stairs: Yes Stairs assistance: Min assist Stair Management: No rails;Step to pattern;Forwards Number of Stairs: 2 General stair comments: Pt has 2 STE her home, reports her son is her railing helping her into and out of the house, so PT was her assist for stair practice. Cues for safest LE sequencing as she states the her R knee is very arthritic and can give away sometimes.  Wheelchair Mobility    Modified Rankin (Stroke Patients Only)       Balance                                             Pertinent Vitals/Pain Pain Assessment: Faces Faces Pain Scale: Hurts little more Pain Location: abdomen Pain Descriptors / Indicators: Sore Pain Intervention(s): Limited activity within patient's tolerance;Monitored during session;Repositioned    Home Living Family/patient expects to be discharged to:: Private residence Living Arrangements: Children (son and daughter in Social worker) Available Help at Discharge: Family;Available 24 hours/day (son  works from home) Type of Home: House Home Access: Stairs to enter Entrance Stairs-Rails: None Secretary/administrator of Steps: 2 Home Layout: Multi-level;Other (Comment) (pt stays on the first floor) Home Equipment: None      Prior Function Level of Independence: Independent         Comments: pt reports the family does not let her drive anymore, but is generally independent ottherwise.     Hand Dominance    Dominant Hand: Right    Extremity/Trunk Assessment   Upper Extremity Assessment Upper Extremity Assessment: Defer to OT evaluation    Lower Extremity Assessment Lower Extremity Assessment: Generalized weakness    Cervical / Trunk Assessment Cervical / Trunk Assessment: Normal  Communication   Communication: No difficulties  Cognition Arousal/Alertness: Awake/alert Behavior During Therapy: WFL for tasks assessed/performed Overall Cognitive Status: Within Functional Limits for tasks assessed                                        General Comments      Exercises     Assessment/Plan    PT Assessment Patient needs continued PT services  PT Problem List Decreased strength;Decreased activity tolerance;Decreased balance;Decreased mobility;Decreased knowledge of use of DME;Decreased knowledge of precautions;Pain       PT Treatment Interventions DME instruction;Gait training;Stair training;Functional mobility training;Therapeutic activities;Therapeutic exercise;Balance training;Patient/family education    PT Goals (Current goals can be found in the Care Plan section)  Acute Rehab PT Goals Patient Stated Goal: to go home today if they let her PT Goal Formulation: With patient Time For Goal Achievement: 09/26/20 Potential to Achieve Goals: Good    Frequency Min 3X/week   Barriers to discharge        Co-evaluation               AM-PAC PT "6 Clicks" Mobility  Outcome Measure Help needed turning from your back to your side while in a flat bed without using bedrails?: A Little Help needed moving from lying on your back to sitting on the side of a flat bed without using bedrails?: A Little Help needed moving to and from a bed to a chair (including a wheelchair)?: A Little Help needed standing up from a chair using your arms (e.g., wheelchair or bedside chair)?: A Little Help needed to walk in hospital room?: A Little Help needed climbing 3-5 steps with  a railing? : A Little 6 Click Score: 18    End of Session   Activity Tolerance: Patient limited by pain Patient left: in chair;with call bell/phone within reach   PT Visit Diagnosis: Muscle weakness (generalized) (M62.81);Difficulty in walking, not elsewhere classified (R26.2);Pain Pain - Right/Left:  (midline) Pain - part of body:  (abdomen)    Time: 1320-1350 PT Time Calculation (min) (ACUTE ONLY): 30 min   Charges:   PT Evaluation $PT Eval Moderate Complexity: 1 Mod PT Treatments $Gait Training: 8-22 mins       Corinna Capra, PT, DPT  Acute Rehabilitation Ortho Tech Supervisor [phone] pager [phone]) [phone] office

## 2020-09-25 DIAGNOSIS — Z9889 Other specified postprocedural states: Secondary | ICD-10-CM

## 2020-09-25 DIAGNOSIS — Q453 Other congenital malformations of pancreas and pancreatic duct: Secondary | ICD-10-CM

## 2020-09-25 DIAGNOSIS — N179 Acute kidney failure, unspecified: Secondary | ICD-10-CM

## 2020-09-25 HISTORY — DX: Other congenital malformations of pancreas and pancreatic duct: Q45.3

## 2020-09-25 HISTORY — DX: Other specified postprocedural states: Z98.890

## 2020-09-25 HISTORY — DX: Acute kidney failure, unspecified: N17.9

## 2021-01-09 NOTE — Progress Notes (Signed)
Cardiology Office Note:    Date:  01/10/2021   ID:  Dominique Ochoa, DOB 1941-01-28, MRN [id number]  PCP:  Redmond School, NP  Cardiologist:  Norman Herrlich, MD    Referring MD: Redmond School, NP    ASSESSMENT:    1. Frequent PVCs   2. PSVT (paroxysmal supraventricular tachycardia) (HCC)   3. Essential hypertension   4. RBBB (right bundle branch block with left anterior fascicular block)    PLAN:    In order of problems listed above:  A heart rhythm perspective doing well no recurrent PVCs or SVT clinically she has a smart watch apple and I hand wrote a note to enable atrial fibrillation detection and high rate alerts. Stable BP at target continue calcium channel blocker I encouraged her to get back to trending her blood pressures at home she did not passed Stable EKG pattern AV interval remains normal no syncope or near syncope and will avoid rate slowing medications if possible   Next appointment: 1 year   Medication Adjustments/Labs and Tests Ordered: Current medicines are reviewed at length with the patient today.  Concerns regarding medicines are outlined above.  No orders of the defined types were placed in this encounter.  No orders of the defined types were placed in this encounter.  Chief complaint follow-up for arrhythmia History of Present Illness:    Dominique Ochoa is a 80 y.o. 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  Compliance with diet, lifestyle and medications: Yes  She is no longer taking a beta-blocker.  She has had no episodes of irregular rapid heart rhythm. Her son gave her an apple watch and I hand wrote a note for her discharge on atrial fibrillation detection and high rate alert for rate of 140 or greater. She has had no significant illness in the interim no edema shortness of breath chest pain palpitation or syncope. EKG performed August shows a  stable pattern bifascicular heart block Most recent labs 09/12/2020 hemoglobin 12.0 creatinine 1.11 potassium 4.0  EKG [facility] 09/09/2020 independently reviewed sinus rhythm normal PR interval right bundle branch block left anterior hemiblock unchanged from 11/17/2019 Past Medical History:  Diagnosis Date   B12 deficiency 02/03/2018   CKD (chronic kidney disease), 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   GERD (gastroesophageal reflux disease)    HTN (hypertension) 09/16/2015   Hyperglycemia 09/16/2015   Hyperlipidemia 11/16/2019   Hypothyroidism 09/16/2015   Mild intermittent asthma without complication 11/16/2019   Near syncope 04/20/2017   Normocytic anemia 04/20/2017   OSA (obstructive sleep apnea) 01/24/2017   Formatting of this note might be different from the original. Sx cleared after wt loss   PSVT (paroxysmal supraventricular tachycardia) (HCC) 05/22/2016   Sepsis (HCC) 04/03/2018   Ventral hernia with bowel obstruction 04/16/2019   Vitamin D deficiency disease 11/16/2019    Past Surgical History:  Procedure Laterality Date   CHOLECYSTECTOMY N/A 04/04/2018   Procedure: LAPAROSCOPIC CHOLECYSTECTOMY WITH INTRAOPERATIVE CHOLANGIOGRAM;  Surgeon: Ovidio Kin, MD;  Location: [facility] ORS;  Service: General;  Laterality: N/A;   INSERTION OF MESH Bilateral 09/11/2020   Procedure: INSERTION OF MESH;  Surgeon: Axel Filler, MD;  Location: [facility] OR;  Service: General;  Laterality: Bilateral;    Current Medications: Current Meds  Medication Sig   albuterol (PROVENTIL HFA;VENTOLIN HFA) 108 (90 Base) MCG/ACT inhaler Inhale 1 puff into the lungs every 6 (six) hours as needed for wheezing or  shortness of breath.   albuterol (PROVENTIL) (2.5 MG/3ML) 0.083% nebulizer solution Take 1 vial by nebulization every 6 (six) hours as needed for wheezing or shortness of breath.    amLODipine (NORVASC) 5 MG tablet Take 5 mg by mouth daily.   cyanocobalamin (,VITAMIN B-12,) 1000 MCG/ML injection Inject 1,000  mcg into the muscle every 30 (thirty) days.   levothyroxine (SYNTHROID, LEVOTHROID) 25 MCG tablet Take 25 mcg by mouth daily before breakfast.   montelukast (SINGULAIR) 10 MG tablet Take 10 mg by mouth at bedtime.   Multiple Vitamin (MULTIVITAMIN WITH MINERALS) TABS tablet Take 1 tablet by mouth daily.   Vitamin D, Ergocalciferol, (DRISDOL) 50000 units CAPS capsule Take 50,000 Units by mouth every Tuesday.     Allergies:   Fish allergy, Fish oil, Lisinopril, and Sulfamethoxazole-trimethoprim   Social History   Socioeconomic History   Marital status: Widowed    Spouse name: Not on file   Number of children: Not on file   Years of education: Not on file   Highest education level: Not on file  Occupational History   Occupation: retired  Tobacco Use   Smoking status: Never    Passive exposure: Never   Smokeless tobacco: Never  Vaping Use   Vaping Use: Never used  Substance and Sexual Activity   Alcohol use: No   Drug use: No   Sexual activity: Not on file  Other Topics Concern   Not on file  Social History Narrative   Not on file   Social Determinants of Health   Financial Resource Strain: Not on file  Food Insecurity: Not on file  Transportation Needs: Not on file  Physical Activity: Not on file  Stress: Not on file  Social Connections: Not on file     Family History: The patient's family history includes Hypertension in her son. ROS:   Please see the history of present illness.    All other systems reviewed and are negative.  EKGs/Labs/Other Studies Reviewed:    The following studies were reviewed today:    Recent Labs: 09/12/2020: ALT 12; BUN 21; Creatinine, Ser 1.11; Hemoglobin 12.0; Platelets 224; Potassium 4.0; Sodium 135  Recent Lipid Panel    Component Value Date/Time   CHOL 98 04/04/2018 0222   TRIG 27 04/04/2018 0222   HDL 40 (L) 04/04/2018 0222   CHOLHDL 2.5 04/04/2018 0222   VLDL 5 04/04/2018 0222   LDLCALC 53 04/04/2018 0222    Physical  Exam:    VS:  BP (!) 144/82    Pulse (!) 102    Ht 5\' 4"  (1.626 m)    Wt 132 lb (59.9 kg)    SpO2 98%    BMI 22.66 kg/m     Wt Readings from Last 3 Encounters:  01/10/21 132 lb (59.9 kg)  09/10/20 132 lb 7.9 oz (60.1 kg)  01/05/20 146 lb (66.2 kg)    Repeat blood pressure by me 138/70 GEN: Appears her age well nourished, well developed in no acute distress HEENT: Normal NECK: No JVD; No carotid bruits LYMPHATICS: No lymphadenopathy CARDIAC: RRR, no murmurs, rubs, gallops RESPIRATORY:  Clear to auscultation without rales, wheezing or rhonchi  ABDOMEN: Soft, non-tender, non-distended MUSCULOSKELETAL:  No edema; No deformity  SKIN: Warm and dry NEUROLOGIC:  Alert and oriented x 3 PSYCHIATRIC:  Normal affect    Signed, Shirlee More, MD  01/10/2021 8:18 AM    [place]

## 2021-01-10 ENCOUNTER — Encounter: Payer: Self-pay | Admitting: Cardiology

## 2021-01-10 ENCOUNTER — Other Ambulatory Visit: Payer: Self-pay

## 2021-01-10 ENCOUNTER — Ambulatory Visit: Payer: Medicare Other | Admitting: Cardiology

## 2021-01-10 VITALS — BP 144/82 | HR 102 | Ht 64.0 in | Wt 132.0 lb

## 2021-01-10 DIAGNOSIS — I452 Bifascicular block: Secondary | ICD-10-CM

## 2021-01-10 DIAGNOSIS — I493 Ventricular premature depolarization: Secondary | ICD-10-CM

## 2021-01-10 DIAGNOSIS — I471 Supraventricular tachycardia: Secondary | ICD-10-CM | POA: Diagnosis not present

## 2021-01-10 DIAGNOSIS — I10 Essential (primary) hypertension: Secondary | ICD-10-CM | POA: Diagnosis not present

## 2021-01-10 NOTE — Patient Instructions (Signed)

## 2021-10-10 DIAGNOSIS — M205X2 Other deformities of toe(s) (acquired), left foot: Secondary | ICD-10-CM

## 2021-10-10 DIAGNOSIS — L84 Corns and callosities: Secondary | ICD-10-CM

## 2021-10-10 HISTORY — DX: Other deformities of toe(s) (acquired), left foot: M20.5X2

## 2021-10-10 HISTORY — DX: Corns and callosities: L84

## 2021-12-24 IMAGING — CT CT ABD-PELV W/ CM
2 of 8 series · 15 of 46 positions shown, 17 images · IV contrast (Omnipaque)
Comparison: CT 04/16/2019

CLINICAL DATA: Diverticulitis suspected

Patient reports abdominal pain and vomiting.
EXAM:
CT ABDOMEN AND PELVIS WITH CONTRAST
TECHNIQUE: Multidetector CT imaging of the abdomen and pelvis was performed
using the standard protocol following bolus administration of
intravenous contrast.
CONTRAST:  100mL OMNIPAQUE IOHEXOL 300 MG/ML  SOLN

[Series 8: coronal st · coronal · 0.75mm/px · 3 of 91 slices shown]
[im 31/91  soft-tissue]
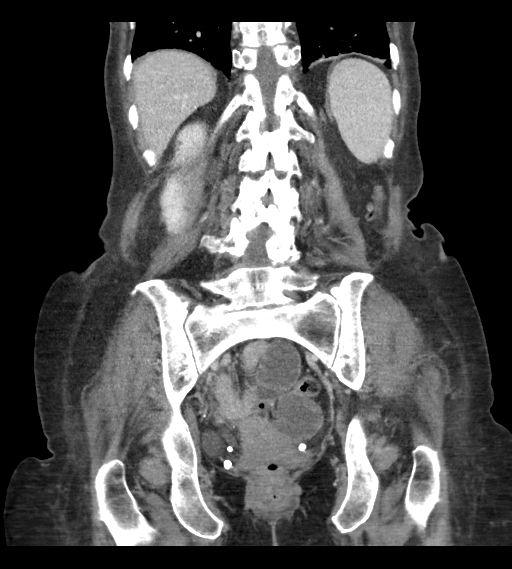
[im 41/91  soft-tissue]
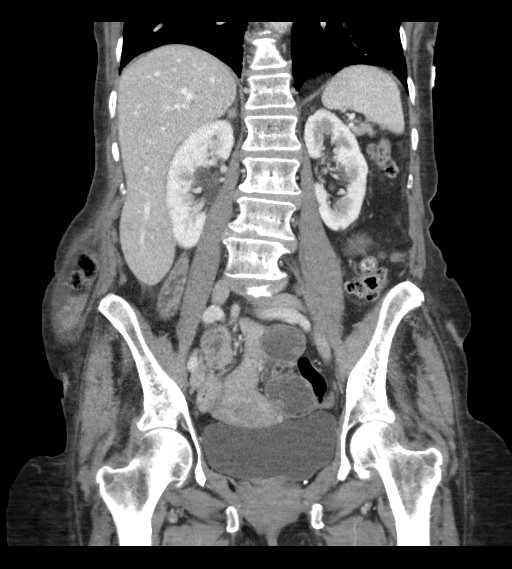
[im 51/91  soft-tissue]
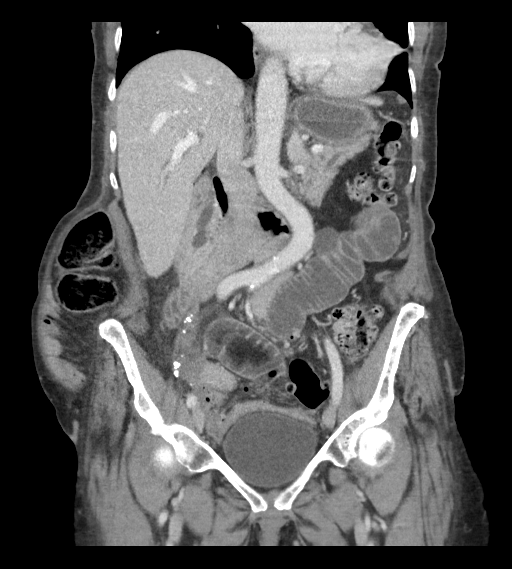

[Series 11: thins · axial · 0.77mm/px · z∈[-293,+48]mm · 12 of 778 slices shown, 14 images]
[im 63/778  soft-tissue]
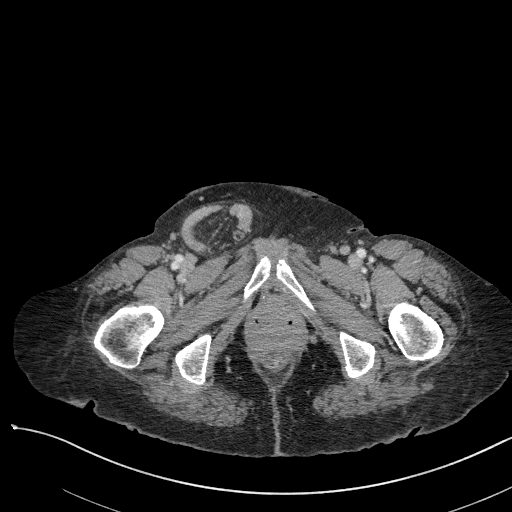
[im 63/778  bone]
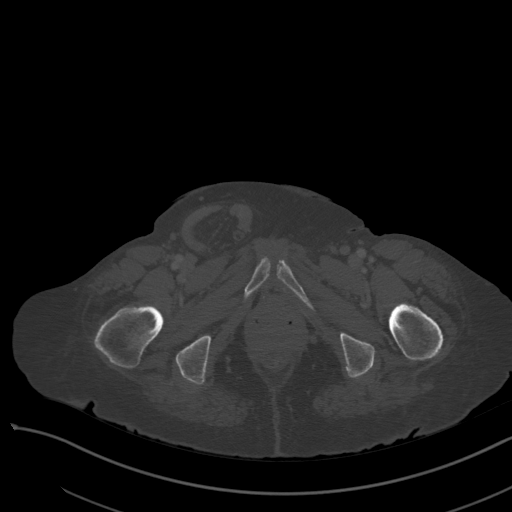
[im 125/778  soft-tissue]
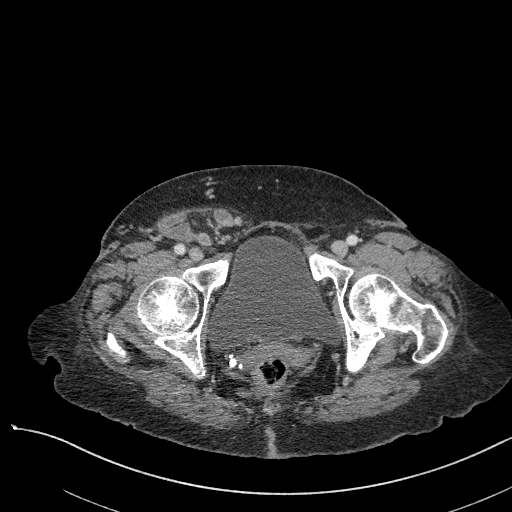
[im 187/778  soft-tissue]
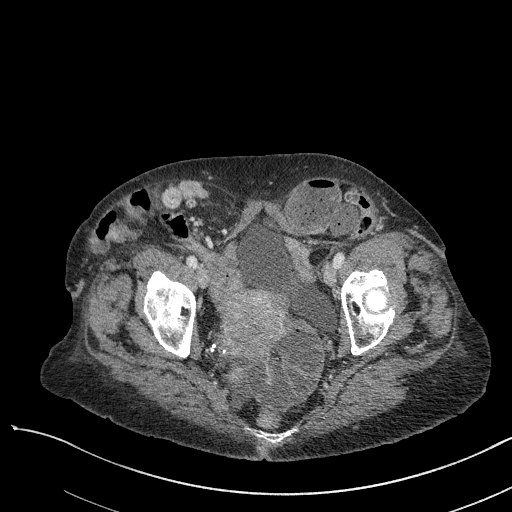
[im 249/778  soft-tissue]
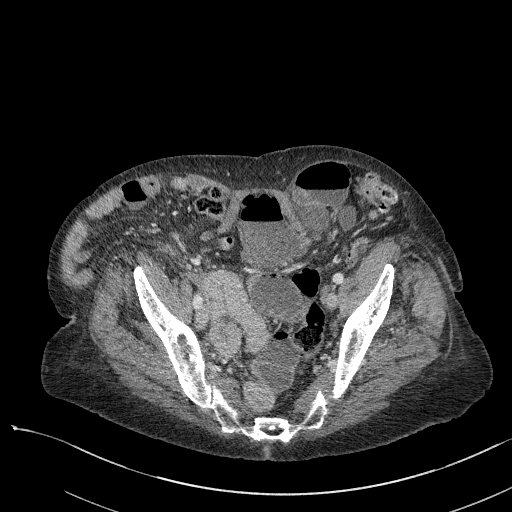
[im 311/778  soft-tissue]
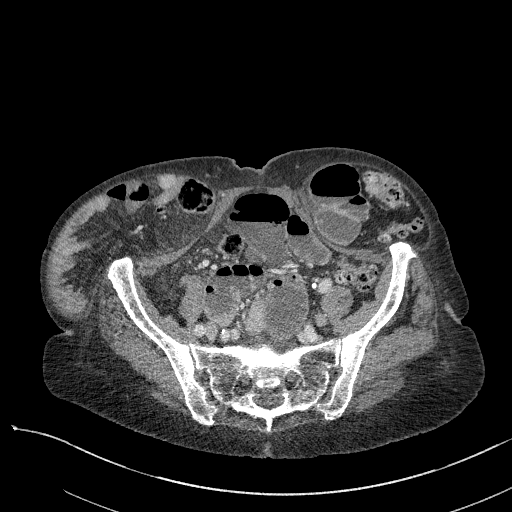
[im 373/778  soft-tissue]
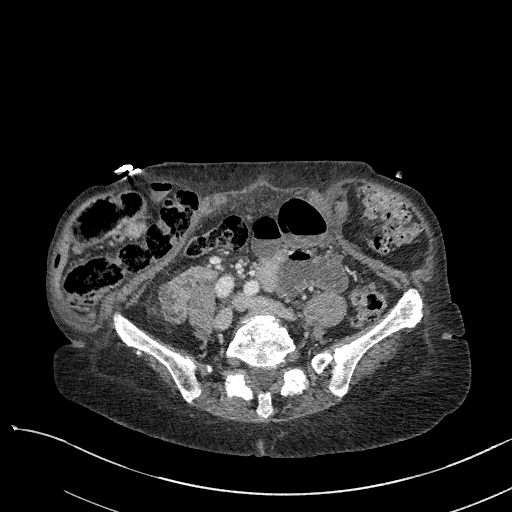
[im 436/778  soft-tissue]
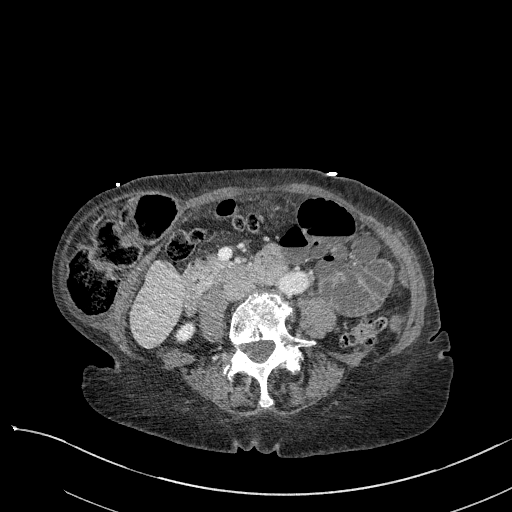
[im 498/778  soft-tissue]
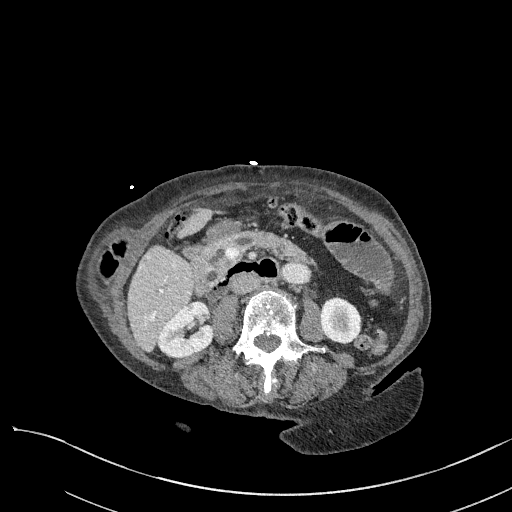
[im 560/778  soft-tissue]
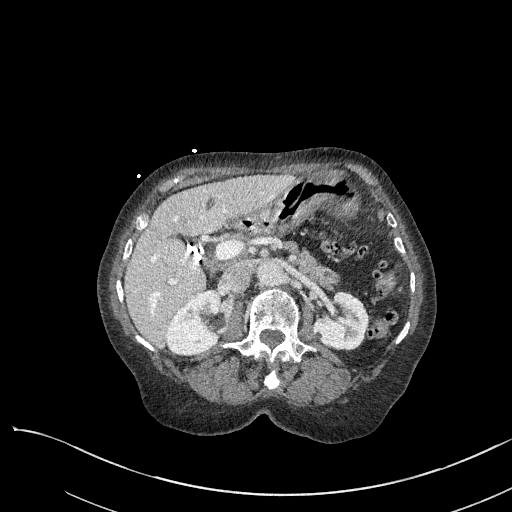
[im 560/778  bone]
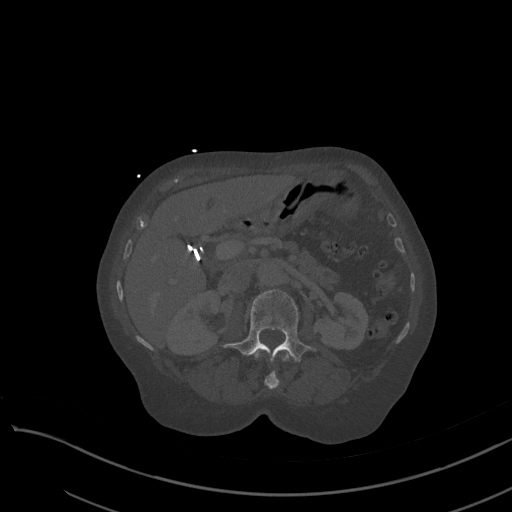
[im 622/778  soft-tissue]
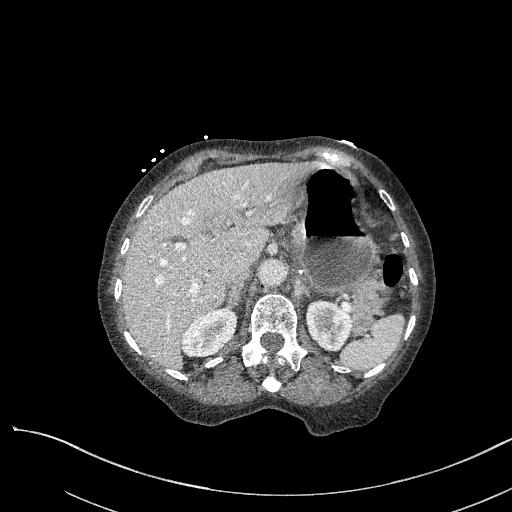
[im 684/778  soft-tissue]
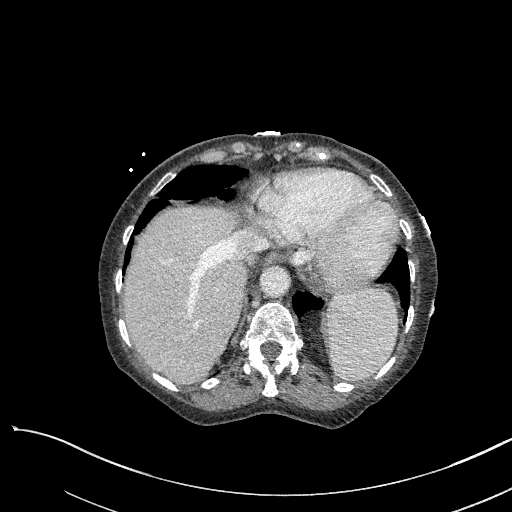
[im 746/778  soft-tissue]
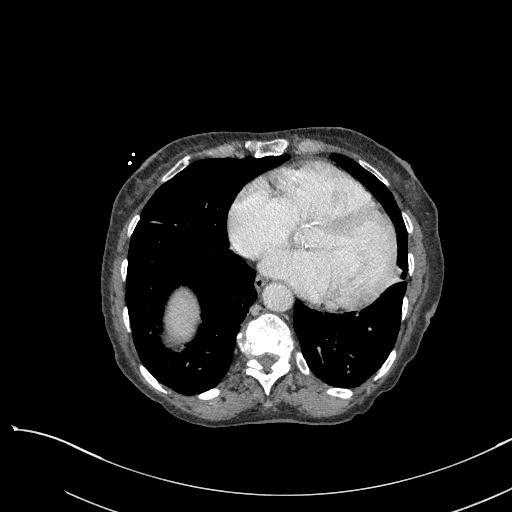

[15 of 46 positions shown; findings below may reference images not displayed]

FINDINGS: Lower chest: Minimal lingular and right middle lobe atelectasis. No
pleural effusion. Upper normal heart size.

Hepatobiliary: No focal hepatic lesion. Post cholecystectomy. Mild
intrahepatic biliary ductal dilatation. Common bile duct measures 10
mm, normal for postcholecystectomy state.

Pancreas: Parenchymal atrophy. There is progressive pancreatic
ductal dilatation from prior exam, 6 mm in the pancreatic head. No
acute peripancreatic inflammation. Small cyst in the pancreatic body
measures 10 mm, unchanged from prior, series 10, image 25. There is
no obvious obstructing pancreatic mass by CT.

Spleen: Normal in size. There are few nonspecific low densities in
the upper aspect of the spleen.

Adrenals/Urinary Tract: Normal adrenal glands. No hydronephrosis.
Multiple subcentimeter bilateral low-density renal cortical lesions
are typically cysts. Symmetric renal excretion on delayed phase
imaging. The urinary bladder is unremarkable.

Stomach/Bowel: The stomach is distended with intraluminal fluid.
There may be mild gastric hyperemia and perigastric fat stranding.
Dilated fluid-filled small bowel within the left abdomen there is a
large left lower quadrant ventral abdominal wall/inguinal hernia
containing loops of small bowel as well as colon. Small bowel within
and proximal to the hernia are dilated and fluid-filled. No bowel
pneumatosis. The exiting small bowel appears decompressed. There is
a large right lower quadrant ventral abdominal wall/inguinal hernia
containing small bowel and portions of colon, without obstruction.
The appendix is located within this large hernia. There is diffuse
colonic diverticulosis without focal diverticulitis.

Vascular/Lymphatic: Aortic atherosclerosis and tortuosity. No portal
venous or mesenteric gas. Patent portal and mesenteric veins. No
bulky abdominopelvic adenopathy.

Reproductive: Uterus remains in situ, anteverted. No obvious adnexal
mass.

Other: Small volume of free fluid in the pelvis. No free air. Large
bilateral lower ventral abdominal wall/inguinal hernias containing
large and small bowel, causing obstruction on the left.

Musculoskeletal: Multilevel degenerative change in the lumbar spine.
Grade 1 anterolisthesis of L4 on L5 is likely facet mediated. There
are no acute or suspicious osseous abnormalities.
IMPRESSION: 1. Small bowel obstruction secondary to a left lower quadrant
ventral abdominal wall/inguinal hernia. Hernia also contains
noninflamed colon.
2. Large right ventral abdominal wall/inguinal hernia containing
nonobstructed small bowel, colon, and appendix.
3. Progressive pancreatic ductal dilatation from prior exam, 6 mm in
the pancreatic head. No obvious obstructing pancreatic mass by CT.
Stable appearance of 10 mm pancreatic cyst in the body. Recommend
further evaluation with pancreatic protocol MRI/MRCP on an elective
basis to exclude the possibility of occult pancreatic head lesion.
4. Colonic diverticulosis without diverticulitis.

Aortic Atherosclerosis ([id number]).

## 2022-03-13 ENCOUNTER — Encounter: Payer: Self-pay | Admitting: Cardiology

## 2022-03-13 ENCOUNTER — Ambulatory Visit: Payer: Medicare Other | Attending: Cardiology | Admitting: Cardiology

## 2022-03-13 VITALS — BP 156/72 | HR 94 | Ht 66.0 in | Wt 129.1 lb

## 2022-03-13 DIAGNOSIS — I471 Supraventricular tachycardia, unspecified: Secondary | ICD-10-CM | POA: Diagnosis not present

## 2022-03-13 DIAGNOSIS — I10 Essential (primary) hypertension: Secondary | ICD-10-CM

## 2022-03-13 DIAGNOSIS — R011 Cardiac murmur, unspecified: Secondary | ICD-10-CM

## 2022-03-13 DIAGNOSIS — G4733 Obstructive sleep apnea (adult) (pediatric): Secondary | ICD-10-CM

## 2022-03-13 HISTORY — DX: Cardiac murmur, unspecified: R01.1

## 2022-03-13 NOTE — Progress Notes (Signed)
Cardiology Office Note:    Date:  03/13/2022   ID:  Dominique Ochoa, DOB [birth year], MRN [id number]  PCP:  Aleen Campi, NP  Cardiologist:  Jenean Lindau, MD   Referring MD: Aleen Campi, NP    ASSESSMENT:    1. Essential hypertension   2. PSVT (paroxysmal supraventricular tachycardia)   3. OSA (obstructive sleep apnea)   4. Cardiac murmur    PLAN:    In order of problems listed above:  Primary prevention stressed with the patient.  Importance of compliance with diet medication stressed and she vocalized understanding.  She was advised to ambulate at least half an hour a day 5 days a week and she promises to do so. Essential hypertension: Blood pressure stable and diet was emphasized.  Lifestyle modification urged.  She has an element of whitecoat hypertension.  She will go home and keep a track of her blood pressures for a week and send them to [place]. Cardiac murmur: Echo will be done to assess murmur heard on auscultation Obstructive sleep apnea: Sleep health issues were discussed and this is followed by primary care. She will have baseline blood work today.Patient will be seen in follow-up appointment in 12 months or earlier if the patient has any concerns    Medication Adjustments/Labs and Tests Ordered: Current medicines are reviewed at length with the patient today.  Concerns regarding medicines are outlined above.  No orders of the defined types were placed in this encounter.  No orders of the defined types were placed in this encounter.    No chief complaint on file.    History of Present Illness:    Dominique Ochoa is a 82 y.o. female.  Patient has past medical history of essential hypertension, obstructive sleep apnea and is previously evaluated and followed by my partner.  She is seen today for the first time by me.  Our receptionist Elizbeth Squires chaperoned the entire visit.  Patient denies any chest pain orthopnea or PND.  She is ambulatory  and walks without any symptoms.  At the time of my evaluation, the patient is alert awake oriented and in no distress.  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 8/22 bilateral   Hyperglycemia 09/16/2015   Hyperlipidemia 11/16/2019   Hypertension    Hypothyroidism 09/16/2015   Injury of right toe, sequela 04/20/2017   Mild intermittent asthma without complication [id number]   Near syncope 04/20/2017   Normocytic anemia 04/20/2017   OSA (obstructive sleep apnea) 01/24/2017   Formatting of this note might be different from the original. Sx cleared after wt loss   Pain in the chest 09/16/2015   PSVT (paroxysmal supraventricular tachycardia) 05/22/2016   Sepsis ([place]) 04/03/2018   Thyroid disease    Ventral hernia with bowel obstruction 04/16/2019   Vitamin D deficiency disease 11/16/2019    Past Surgical  History:  Procedure Laterality Date   CHOLECYSTECTOMY N/A 04/04/2018   Procedure: LAPAROSCOPIC CHOLECYSTECTOMY WITH INTRAOPERATIVE CHOLANGIOGRAM;  Surgeon: Alphonsa Overall, MD;  Location: [facility];  Service: General;  Laterality: N/A;   INSERTION OF MESH Bilateral 09/11/2020   Procedure: INSERTION OF MESH;  Surgeon: Ralene Ok, MD;  Location: [facility];  Service: General;  Laterality: Bilateral;    Current Medications: Current Meds  Medication Sig    albuterol (PROVENTIL HFA;VENTOLIN HFA) 108 (90 Base) MCG/ACT inhaler Inhale 1 puff into the lungs every 6 (six) hours as needed for wheezing or shortness of breath.   albuterol (PROVENTIL) (2.5 MG/3ML) 0.083% nebulizer solution Take 1 vial by nebulization every 6 (six) hours as needed for wheezing or shortness of breath.    amLODipine (NORVASC) 5 MG tablet Take 5 mg by mouth daily.   cyanocobalamin (,VITAMIN B-12,) 1000 MCG/ML injection Inject 1,000 mcg into the muscle every 30 (thirty) days.   levothyroxine (SYNTHROID, LEVOTHROID) 25 MCG tablet Take 25 mcg by mouth daily before breakfast.   montelukast (SINGULAIR) 10 MG tablet Take 10 mg by mouth at bedtime.   Multiple Vitamin (MULTIVITAMIN WITH MINERALS) TABS tablet Take 1 tablet by mouth daily.   Vitamin D, Ergocalciferol, (DRISDOL) 50000 units CAPS capsule Take 50,000 Units by mouth every Tuesday.     Allergies:   Fish allergy, Fish oil, Lisinopril, and Sulfamethoxazole-trimethoprim   Social History   Socioeconomic History   Marital status: Widowed    Spouse name: Not on file   Number of children: Not on file   Years of education: Not on file   Highest education level: Not on file  Occupational History   Occupation: retired  Tobacco Use   Smoking status: Never    Passive exposure: Never   Smokeless tobacco: Never  Vaping Use   Vaping Use: Never used  Substance and Sexual Activity   Alcohol use: No   Drug use: No   Sexual activity: Not on file  Other Topics Concern   Not on file  Social History Narrative   Not on file   Social Determinants of Health   Financial Resource Strain: Not on file  Food Insecurity: Not on file  Transportation Needs: Not on file  Physical Activity: Not on file  Stress: Not on file  Social Connections: Not on file     Family History: The patient's family history includes Hypertension in her son.  ROS:   Please see the history of present illness.    All other systems reviewed and are  negative.  EKGs/Labs/Other Studies Reviewed:    The following studies were reviewed today: EKG reveals sinus rhythm left axis deviation and nonspecific ST-T changes   Recent Labs: No results found for requested labs within last 365 days.  Recent Lipid Panel    Component Value Date/Time   CHOL 98 04/04/2018 0222   TRIG 27 04/04/2018 0222   HDL 40 (L) 04/04/2018 0222   CHOLHDL 2.5 04/04/2018 0222   VLDL 5 04/04/2018 0222   LDLCALC 53 04/04/2018 0222    Physical Exam:    VS:  BP (!) 156/72   Pulse 94   Ht 5' 6"$  (1.676 m)   Wt 129 lb 1.3 oz (58.6 kg)   SpO2 95%   BMI 20.83 kg/m     Wt Readings from Last 3 Encounters:  03/13/22 129 lb 1.3 oz (58.6 kg)  01/10/21 132 lb (59.9 kg)  09/10/20 132 lb 7.9 oz (60.1 kg)     GEN: Patient  is in no acute distress HEENT: Normal NECK: No JVD; No carotid bruits LYMPHATICS: No lymphadenopathy CARDIAC: Hear sounds regular, 2/6 systolic murmur at the apex. RESPIRATORY:  Clear to auscultation without rales, wheezing or rhonchi  ABDOMEN: Soft, non-tender, non-distended MUSCULOSKELETAL:  No edema; No deformity  SKIN: Warm and dry NEUROLOGIC:  Alert and oriented x 3 PSYCHIATRIC:  Normal affect   Signed, Jenean Lindau, MD  03/13/2022 11:45 AM    [place]

## 2022-03-13 NOTE — Patient Instructions (Addendum)
Medication Instructions:  Your physician recommends that you continue on your current medications as directed. Please refer to the Current Medication list given to you today.  *If you need a refill on your cardiac medications before your next appointment, please call your pharmacy*   Lab Work: BMET, TSH, Magnesium  If you have labs (blood work) drawn today and your tests are completely normal, you will receive your results only by: [place] (if you have MyChart) OR A paper copy in the mail If you have any lab test that is abnormal or we need to change your treatment, we will call you to review the results.   Testing/Procedures: Echocardiogram    Follow-Up: At [facility], you and your health needs are our priority.  As part of our continuing mission to provide you with exceptional heart care, we have created designated Provider Care Teams.  These Care Teams include your primary Cardiologist (physician) and Advanced Practice Providers (APPs -  Physician Assistants and Nurse Practitioners) who all work together to provide you with the care you need, when you need it.  We recommend signing up for the patient portal called "MyChart".  Sign up information is provided on this After Visit Summary.  MyChart is used to connect with patients for Virtual Visits (Telemedicine).  Patients are able to view lab/test results, encounter notes, upcoming appointments, etc.  Non-urgent messages can be sent to your provider as well.   To learn more about what you can do with MyChart, go to [email or web address].    Your next appointment:   12 month(s)  Provider:   Jyl Heinz, MD    Other Instructions Please keep a BP log for 1 weeks and send by MyChart or mail.  Blood Pressure Record Sheet To take your blood pressure, you will need a blood pressure machine. You can buy a blood pressure machine (blood pressure monitor) at your clinic, drug store, or online. When choosing one,  consider: An automatic monitor that has an arm cuff. A cuff that wraps snugly around your upper arm. You should be able to fit only one finger between your arm and the cuff. A device that stores blood pressure reading results. Do not choose a monitor that measures your blood pressure from your wrist or finger. Follow your health care provider's instructions for how to take your blood pressure. To use this form: Get one reading in the morning (a.m.) 1-2 hours after you take any medicines. Get one reading in the evening (p.m.) before supper.   Blood pressure log Date: _______________________  a.m. _____________________(1st reading) HR___________            p.m. _____________________(2nd reading) HR__________  Date: _______________________  a.m. _____________________(1st reading) HR___________            p.m. _____________________(2nd reading) HR__________  Date: _______________________  a.m. _____________________(1st reading) HR___________            p.m. _____________________(2nd reading) HR__________  Date: _______________________  a.m. _____________________(1st reading) HR___________            p.m. _____________________(2nd reading) HR__________  Date: _______________________  a.m. _____________________(1st reading) HR___________            p.m. _____________________(2nd reading) HR__________  Date: _______________________  a.m. _____________________(1st reading) HR___________            p.m. _____________________(2nd reading) HR__________  Date: _______________________  a.m. _____________________(1st reading) HR___________            p.m. _____________________(2nd reading) HR__________   This information  is not intended to replace advice given to you by your health care provider. Make sure you discuss any questions you have with your health care provider. Document Revised: 05/05/2019 Document Reviewed: 05/05/2019 Elsevier Patient Education  2021 [employer].

## 2022-03-21 ENCOUNTER — Ambulatory Visit (HOSPITAL_BASED_OUTPATIENT_CLINIC_OR_DEPARTMENT_OTHER)
Admission: RE | Admit: 2022-03-21 | Discharge: 2022-03-21 | Disposition: A | Discharge status: Home / Self Care | Payer: Medicare Other | Source: Ambulatory Visit | Attending: Cardiology | Admitting: Cardiology

## 2022-03-21 DIAGNOSIS — R011 Cardiac murmur, unspecified: Secondary | ICD-10-CM | POA: Diagnosis not present

## 2022-03-22 ENCOUNTER — Telehealth: Payer: Self-pay

## 2022-03-22 LAB — BASIC METABOLIC PANEL
BUN/Creatinine Ratio: 18 (ref 12–28)
BUN: 17 mg/dL (ref 8–27)
CO2: 24 mmol/L (ref 20–29)
Calcium: 9.6 mg/dL (ref 8.7–10.3)
Chloride: 106 mmol/L (ref 96–106)
Creatinine, Ser: 0.94 mg/dL (ref 0.57–1.00)
Glucose: 105 mg/dL — ABNORMAL HIGH (ref 70–99)
Potassium: 3.5 mmol/L (ref 3.5–5.2)
Sodium: 147 mmol/L — ABNORMAL HIGH (ref 134–144)
eGFR: 60 mL/min/{1.73_m2} (ref >59)

## 2022-03-22 LAB — ECHOCARDIOGRAM COMPLETE
Area-P 1/2: 6.6 cm2
MV M vel: 4.86 m/s
MV Peak grad: 94.5 mmHg
P 1/2 time: 622 msec
S' Lateral: 3.5 cm

## 2022-03-22 LAB — MAGNESIUM: Magnesium: 1.7 mg/dL (ref 1.6–2.3)

## 2022-03-22 LAB — TSH: TSH: 1.68 u[IU]/mL (ref 0.450–4.500)

## 2022-03-22 NOTE — Telephone Encounter (Signed)
Patient notified of results and had no further questions. Results sent to PCP.

## 2022-03-22 NOTE — Telephone Encounter (Signed)
-----   Message from Jenean Lindau, MD sent at 03/22/2022  1:12 PM EST ----- Mild to moderate mitral regurgitation, otherwise the results of the study is unremarkable. Please inform patient. I will discuss in detail at next appointment. Cc  primary care/referring physician Jenean Lindau, MD 03/22/2022 1:12 PM

## 2023-01-09 ENCOUNTER — Emergency Department (HOSPITAL_BASED_OUTPATIENT_CLINIC_OR_DEPARTMENT_OTHER): Payer: Medicare Other

## 2023-01-09 ENCOUNTER — Emergency Department (HOSPITAL_BASED_OUTPATIENT_CLINIC_OR_DEPARTMENT_OTHER)
Admission: EM | Admit: 2023-01-09 | Discharge: 2023-01-09 | Disposition: A | Discharge status: Home / Self Care | Payer: Medicare Other | Attending: Emergency Medicine | Admitting: Emergency Medicine

## 2023-01-09 ENCOUNTER — Encounter (HOSPITAL_BASED_OUTPATIENT_CLINIC_OR_DEPARTMENT_OTHER): Payer: Self-pay

## 2023-01-09 ENCOUNTER — Other Ambulatory Visit (HOSPITAL_BASED_OUTPATIENT_CLINIC_OR_DEPARTMENT_OTHER): Payer: Self-pay

## 2023-01-09 DIAGNOSIS — E039 Hypothyroidism, unspecified: Secondary | ICD-10-CM | POA: Diagnosis not present

## 2023-01-09 DIAGNOSIS — E876 Hypokalemia: Secondary | ICD-10-CM | POA: Insufficient documentation

## 2023-01-09 DIAGNOSIS — E860 Dehydration: Secondary | ICD-10-CM | POA: Diagnosis not present

## 2023-01-09 DIAGNOSIS — Z79890 Hormone replacement therapy (postmenopausal): Secondary | ICD-10-CM | POA: Insufficient documentation

## 2023-01-09 DIAGNOSIS — N189 Chronic kidney disease, unspecified: Secondary | ICD-10-CM | POA: Insufficient documentation

## 2023-01-09 DIAGNOSIS — R112 Nausea with vomiting, unspecified: Secondary | ICD-10-CM | POA: Insufficient documentation

## 2023-01-09 DIAGNOSIS — R1110 Vomiting, unspecified: Secondary | ICD-10-CM | POA: Diagnosis present

## 2023-01-09 LAB — URINALYSIS, ROUTINE W REFLEX MICROSCOPIC
Bilirubin Urine: NEGATIVE
Glucose, UA: NEGATIVE mg/dL
Hgb urine dipstick: NEGATIVE
Ketones, ur: NEGATIVE mg/dL
Leukocytes,Ua: NEGATIVE
Nitrite: NEGATIVE
Protein, ur: 30 mg/dL — AB
Specific Gravity, Urine: 1.015 (ref 1.005–1.030)
pH: 6 (ref 5.0–8.0)

## 2023-01-09 LAB — COMPREHENSIVE METABOLIC PANEL
ALT: 13 U/L (ref 0–44)
AST: 29 U/L (ref 15–41)
Albumin: 3.9 g/dL (ref 3.5–5.0)
Alkaline Phosphatase: 66 U/L (ref 38–126)
Anion gap: 11 (ref 5–15)
BUN: 15 mg/dL (ref 8–23)
CO2: 27 mmol/L (ref 22–32)
Calcium: 9.3 mg/dL (ref 8.9–10.3)
Chloride: 101 mmol/L (ref 98–111)
Creatinine, Ser: 0.9 mg/dL (ref 0.44–1.00)
GFR, Estimated: >60 mL/min (ref >60)
Glucose, Bld: 148 mg/dL — ABNORMAL HIGH (ref 70–99)
Potassium: 3.1 mmol/L — ABNORMAL LOW (ref 3.5–5.1)
Sodium: 139 mmol/L (ref 135–145)
Total Bilirubin: 1.1 mg/dL (ref <1.2)
Total Protein: 8 g/dL (ref 6.5–8.1)

## 2023-01-09 LAB — URINALYSIS, MICROSCOPIC (REFLEX)

## 2023-01-09 LAB — CBC
HCT: 38.2 % (ref 36.0–46.0)
Hemoglobin: 12.5 g/dL (ref 12.0–15.0)
MCH: 27.1 pg (ref 26.0–34.0)
MCHC: 32.7 g/dL (ref 30.0–36.0)
MCV: 82.9 fL (ref 80.0–100.0)
Platelets: 296 10*3/uL (ref 150–400)
RBC: 4.61 MIL/uL (ref 3.87–5.11)
RDW: 14.8 % (ref 11.5–15.5)
WBC: 8.2 10*3/uL (ref 4.0–10.5)
nRBC: 0 % (ref 0.0–0.2)

## 2023-01-09 LAB — LIPASE, BLOOD: Lipase: 33 U/L (ref 11–51)

## 2023-01-09 MED ORDER — ONDANSETRON 4 MG PO TBDP
4.0000 mg | ORAL_TABLET | Freq: Three times a day (TID) | ORAL | 0 refills | Status: DC | PRN
  Filled 2023-01-09: qty 12, 4d supply, fill #0

## 2023-01-09 MED ORDER — ONDANSETRON HCL 4 MG/2ML IJ SOLN
4.0000 mg | Freq: Once | INTRAMUSCULAR | Status: AC
  Administered 2023-01-09: 4 mg via INTRAVENOUS
  Filled 2023-01-09: qty 2

## 2023-01-09 MED ORDER — SODIUM CHLORIDE 0.9 % IV BOLUS
500.0000 mL | Freq: Once | INTRAVENOUS | Status: AC
  Administered 2023-01-09: 500 mL via INTRAVENOUS

## 2023-01-09 NOTE — ED Triage Notes (Signed)
States that she ate a BBQ sandwich last night and began feeling sick. States that she has vomited x 3. States that the pain radiates from her stomach up to her chest.

## 2023-01-09 NOTE — Discharge Instructions (Signed)
Workup here without any acute findings other than potassium being just slightly low at 3.1.  3.4 is normal.  Would recommend that in a week or 2 just have your regular doctor recheck that.  Take the Zofran as directed.  Return for any new or worse symptoms.  Vomiting blood fevers or worse abdominal pain.  Make an appointment to follow-up with your regular doctor.

## 2023-01-09 NOTE — ED Provider Notes (Addendum)
[facility] Provider Note   CSN: [id number] Arrival date & time: 01/09/23  4132     History  Chief Complaint  Patient presents with   Emesis    Dominique Ochoa is a 82 y.o. female.  Patient yesterday afternoon was eating a barbecue sandwich and then she started feeling sick.  Patient started vomiting in the evening.  Last vomited about 6 this morning.  Associated with some abdominal pain.  No diarrhea.  No blood in her vomit.  Patient overall starting to feel a little bit better right now.  Past medical history significant for gastroesophageal reflux disease PSVT hypothyroidism chronic kidney disease hyperlipidemia history of bowel obstruction in 2021 acute cholecystitis patient's had her gallbladder removed.  Insertion of mass bilaterally.  Patient's never used any tobacco products.       Home Medications Prior to Admission medications   Medication Sig Start Date End Date Taking? Authorizing Provider  amLODipine (NORVASC) 5 MG tablet Take 5 mg by mouth daily.   Yes [provider]  cyanocobalamin (,VITAMIN B-12,) 1000 MCG/ML injection Inject 1,000 mcg into the muscle every 30 (thirty) days.   Yes [provider]  levothyroxine (SYNTHROID, LEVOTHROID) 25 MCG tablet Take 25 mcg by mouth daily before breakfast.   Yes [provider]  Multiple Vitamin (MULTIVITAMIN WITH MINERALS) TABS tablet Take 1 tablet by mouth daily.   Yes [provider]  Vitamin D, Ergocalciferol, (DRISDOL) 50000 units CAPS capsule Take 50,000 Units by mouth every Tuesday.   Yes [provider]  albuterol (PROVENTIL HFA;VENTOLIN HFA) 108 (90 Base) MCG/ACT inhaler Inhale 1 puff into the lungs every 6 (six) hours as needed for wheezing or shortness of breath.    [provider]  albuterol (PROVENTIL) (2.5 MG/3ML) 0.083% nebulizer solution Take 1 vial by nebulization every 6 (six) hours as needed for wheezing or  shortness of breath.  01/28/19   [provider]  montelukast (SINGULAIR) 10 MG tablet Take 10 mg by mouth at bedtime. 03/26/19   [provider]      Allergies    Fish allergy, Fish oil, Lisinopril, and Sulfamethoxazole-trimethoprim    Review of Systems   Review of Systems  Constitutional:  Negative for chills and fever.  HENT:  Negative for ear pain and sore throat.   Eyes:  Negative for pain and visual disturbance.  Respiratory:  Negative for cough and shortness of breath.   Cardiovascular:  Negative for chest pain and palpitations.  Gastrointestinal:  Positive for abdominal pain, nausea and vomiting.  Genitourinary:  Negative for dysuria and hematuria.  Musculoskeletal:  Negative for arthralgias and back pain.  Skin:  Negative for color change and rash.  Neurological:  Negative for seizures and syncope.  All other systems reviewed and are negative.   Physical Exam Updated Vital Signs BP (!) 174/88 (BP Location: Right Arm)   Pulse 93   Temp 98.1 F (36.7 C) (Oral)   Resp 18   Ht 1.626 m (5\' 4" )   Wt 58.1 kg   SpO2 98%   BMI 21.97 kg/m  Physical Exam Vitals and nursing note reviewed.  Constitutional:      General: She is not in acute distress.    Appearance: Normal appearance. She is well-developed.  HENT:     Head: Normocephalic and atraumatic.     Mouth/Throat:     Mouth: Mucous membranes are dry.  Eyes:     Extraocular Movements: Extraocular movements intact.  Conjunctiva/sclera: Conjunctivae normal.     Pupils: Pupils are equal, round, and reactive to light.  Cardiovascular:     Rate and Rhythm: Normal rate and regular rhythm.     Heart sounds: No murmur heard. Pulmonary:     Effort: Pulmonary effort is normal. No respiratory distress.     Breath sounds: Normal breath sounds.  Abdominal:     Palpations: Abdomen is soft.     Tenderness: There is no abdominal tenderness. There is no guarding.  Musculoskeletal:        General: No  swelling.     Cervical back: Normal range of motion and neck supple.  Skin:    General: Skin is warm and dry.     Capillary Refill: Capillary refill takes less than 2 seconds.  Neurological:     General: No focal deficit present.     Mental Status: She is alert and oriented to person, place, and time.  Psychiatric:        Mood and Affect: Mood normal.     ED Results / Procedures / Treatments   Labs (all labs ordered are listed, but only abnormal results are displayed) Labs Reviewed  COMPREHENSIVE METABOLIC PANEL - Abnormal; Notable for the following components:      Result Value   Potassium 3.1 (*)    Glucose, Bld 148 (*)    All other components within normal limits  LIPASE, BLOOD  CBC  URINALYSIS, ROUTINE W REFLEX MICROSCOPIC    EKG EKG Interpretation Date/Time:  Thursday January 09 2023 07:36:34 EST Ventricular Rate:  86 PR Interval:  148 QRS Duration:  144 QT Interval:  425 QTC Calculation: 509 R Axis:   -67  Text Interpretation: Sinus rhythm Multiform ventricular premature complexes RBBB and LAFB No significant change since last tracing Confirmed by Vanetta Mulders [phone]) on 01/09/2023 7:52:19 AM  Radiology No results found.  Procedures Procedures    Medications Ordered in ED Medications  sodium chloride 0.9 % bolus 500 mL (500 mLs Intravenous New Bag/Given 01/09/23 0810)  ondansetron (ZOFRAN) injection 4 mg (4 mg Intravenous Given 01/09/23 2202)    ED Course/ Medical Decision Making/ A&P                                 Medical Decision Making Amount and/or Complexity of Data Reviewed Labs: ordered. Radiology: ordered.  Risk Prescription drug management.   Patient is abdomen is nondistended soft nontender.  Patient's mucous membranes are on the dry side.  Will give a 500 cc bolus of fluid will get labs.  And will get acute abdominal series.  At this point in time do not feel patient needs CT scan abdomen and pelvis and initially it was not  ordered because our CAT scanner was down.  It is now back up.  Will see what labs show and then reassess.  Patient is EKG unchanged from past.  Computer read it as an acute inferior MI.  Patient has no chest pain.  Patient does have a bilateral block.  No concerns for acute cardiac event.  Lipase normal at 33 complete metabolic panel liver function test normal.  Potassium down a little bit at 3.1 and renal functions normal as well.  Iron gap is normal at 11 CBC white count 8.2 hemoglobin 12.5 platelets 296.  Acute abdominal x-ray still pending.  Abdominal film without any acute findings.  Patient states she feels much better.  Will discharge  her with prescription for Zofran to pick up here.  Patient will turn for any new or worse symptoms.  Abdomen still soft and nontender.  Not worried about any acute abdominal process.  This could have been a viral gastritis or possibly the food could have upset her stomach.  Patient nontoxic no acute distress.   Final Clinical Impression(s) / ED Diagnoses Final diagnoses:  Hypokalemia  Nausea and vomiting, unspecified vomiting type  Dehydration    Rx / DC Orders ED Discharge Orders     None         Vanetta Mulders, MD 01/09/23 9147    Vanetta Mulders, MD 01/09/23 [phone]

## 2023-04-03 ENCOUNTER — Encounter: Payer: Self-pay | Admitting: Cardiology

## 2023-04-03 ENCOUNTER — Ambulatory Visit: Payer: Medicare Other | Attending: Cardiology | Admitting: Cardiology

## 2023-04-03 VITALS — BP 132/78 | HR 101 | Ht 65.0 in | Wt 120.1 lb

## 2023-04-03 DIAGNOSIS — I10 Essential (primary) hypertension: Secondary | ICD-10-CM

## 2023-04-03 DIAGNOSIS — G4733 Obstructive sleep apnea (adult) (pediatric): Secondary | ICD-10-CM | POA: Diagnosis not present

## 2023-04-03 NOTE — Progress Notes (Signed)
 Cardiology Office Note:    Date:  04/03/2023   ID:  Dominique Ochoa, DOB 09-May-1940, MRN [id number]  PCP:  Redmond School, NP  Cardiologist:  Garwin Brothers, MD   Referring MD: Redmond School, NP    ASSESSMENT:    1. Essential hypertension   2. OSA (obstructive sleep apnea)    PLAN:    In order of problems listed above:  Primary prevention stressed with the patient.  Importance of compliance with diet medication stressed and patient verbalized standing. She was advised to ambulate and be active to the best of her ability. Essential hypertension: Blood pressure is stable and diet was emphasized.  Lifestyle modification urged. Obstructive sleep apnea: Sleep health issues were discussed and she vocalized understanding.  Questions were answered to her satisfaction Patient will be seen in follow-up appointment in 12 months or earlier if the patient has any concerns.    Medication Adjustments/Labs and Tests Ordered: Current medicines are reviewed at length with the patient today.  Concerns regarding medicines are outlined above.  Orders Placed This Encounter  Procedures   EKG 12-Lead   No orders of the defined types were placed in this encounter.    No chief complaint on file.    History of Present Illness:    Dominique Ochoa is a 83 y.o. female.  Patient has past medical history of essential hypertension and obstructive sleep apnea.  She denies any problems at this time and takes care of activities of daily living.  No chest pain orthopnea or PND.  At the time of my evaluation, the patient is alert awake oriented and in no distress.  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 8/22 bilateral   Hyperglycemia 09/16/2015   Hyperlipidemia 11/16/2019   Hypertension    Hypothyroidism 09/16/2015   Injury of right toe, sequela 04/20/2017   Mild intermittent asthma without complication 11/16/2019   Near syncope 04/20/2017   Normocytic anemia 04/20/2017   OSA (obstructive sleep apnea) 01/24/2017   Formatting of this note might be different from the original. Sx cleared after wt loss   Pain in the chest 09/16/2015   PSVT (paroxysmal supraventricular tachycardia) (HCC) 05/22/2016   Sepsis (HCC) 04/03/2018   Thyroid disease    Ventral hernia with bowel obstruction 04/16/2019   Vitamin D deficiency disease 11/16/2019    Past Surgical History:  Procedure Laterality Date   CHOLECYSTECTOMY N/A 04/04/2018   Procedure: LAPAROSCOPIC CHOLECYSTECTOMY WITH INTRAOPERATIVE CHOLANGIOGRAM;  Surgeon: Ovidio Kin, MD;  Location: [facility];  Service: General;  Laterality: N/A;   INSERTION OF MESH Bilateral 09/11/2020   Procedure: INSERTION OF MESH;  Surgeon: Axel Filler, MD;  Location: [facility] OR;  Service: General;  Laterality: Bilateral;    Current Medications: Current Meds  Medication Sig   albuterol (PROVENTIL HFA;VENTOLIN HFA) 108 (90 Base)  MCG/ACT inhaler Inhale 1 puff into the lungs every 6 (six) hours as needed for wheezing or shortness of breath.   albuterol (PROVENTIL) (2.5 MG/3ML) 0.083% nebulizer solution Take 1 vial by nebulization every 6 (six) hours as needed for wheezing or shortness of breath.    amLODipine (NORVASC) 5 MG tablet Take 5 mg by mouth daily.   cyanocobalamin (,VITAMIN B-12,)  1000 MCG/ML injection Inject 1,000 mcg into the muscle every 30 (thirty) days.   levothyroxine (SYNTHROID, LEVOTHROID) 25 MCG tablet Take 25 mcg by mouth daily before breakfast.   montelukast (SINGULAIR) 10 MG tablet Take 10 mg by mouth at bedtime.   Multiple Vitamin (MULTIVITAMIN WITH MINERALS) TABS tablet Take 1 tablet by mouth daily.   ondansetron (ZOFRAN-ODT) 4 MG disintegrating tablet Take 1 tablet (4 mg total) by mouth every 8 (eight) hours as needed for nausea or vomiting.   VITAMIN D PO Take 1 tablet by mouth daily.     Allergies:   Fish allergy, Fish oil, Lisinopril, and Sulfamethoxazole-trimethoprim   Social History   Socioeconomic History   Marital status: Widowed    Spouse name: Not on file   Number of children: Not on file   Years of education: Not on file   Highest education level: Not on file  Occupational History   Occupation: retired  Tobacco Use   Smoking status: Never    Passive exposure: Never   Smokeless tobacco: Never  Vaping Use   Vaping status: Never Used  Substance and Sexual Activity   Alcohol use: No   Drug use: No   Sexual activity: Not on file  Other Topics Concern   Not on file  Social History Narrative   Not on file   Social Drivers of Health   Financial Resource Strain: Not on file  Food Insecurity: Low Risk  (10/09/2022)   Received from [facility]   Hunger Vital Sign    Worried About Running Out of Food in the Last Year: Never true    Ran Out of Food in the Last Year: Never true  Transportation Needs: No Transportation Needs (10/09/2022)   Received from [employer]    In the past 12 months, has lack of reliable transportation kept you from medical appointments, meetings, work or from getting things needed for daily living? : No  Physical Activity: Not on file  Stress: Not on file  Social Connections: Not on file     Family History: The patient's family history includes Hypertension in her son.  ROS:   Please  see the history of present illness.    All other systems reviewed and are negative.  EKGs/Labs/Other Studies Reviewed:    The following studies were reviewed today: .Marland KitchenEKG Interpretation Date/Time:  Thursday April 03 2023 09:17:25 EST Ventricular Rate:  101 PR Interval:  156 QRS Duration:  140 QT Interval:  380 QTC Calculation: 492 R Axis:   -71  Text Interpretation: Sinus tachycardia with frequent Premature ventricular complexes Right bundle branch block Left anterior fascicular block Bifascicular block Septal infarct , age undetermined When compared with ECG of 09-Jan-2023 07:36, PREVIOUS ECG IS PRESENT Confirmed by Belva Crome [phone]) on 04/03/2023 9:50:32 AM     Recent Labs: 01/09/2023: ALT 13; BUN 15; Creatinine, Ser 0.90; Hemoglobin 12.5; Platelets 296; Potassium 3.1; Sodium 139  Recent Lipid Panel    Component Value Date/Time   CHOL 98 04/04/2018 0222   TRIG 27 04/04/2018 0222   HDL 40 (L) 04/04/2018 0222  CHOLHDL 2.5 04/04/2018 0222   VLDL 5 04/04/2018 0222   LDLCALC 53 04/04/2018 0222    Physical Exam:    VS:  BP 132/78   Pulse (!) 101   Ht 5\' 5"  (1.651 m)   Wt 120 lb 1.3 oz (54.5 kg)   SpO2 97%   BMI 19.98 kg/m     Wt Readings from Last 3 Encounters:  04/03/23 120 lb 1.3 oz (54.5 kg)  01/09/23 128 lb (58.1 kg)  03/13/22 129 lb 1.3 oz (58.6 kg)     GEN: Patient is in no acute distress HEENT: Normal NECK: No JVD; No carotid bruits LYMPHATICS: No lymphadenopathy CARDIAC: Hear sounds regular, 2/6 systolic murmur at the apex. RESPIRATORY:  Clear to auscultation without rales, wheezing or rhonchi  ABDOMEN: Soft, non-tender, non-distended MUSCULOSKELETAL:  No edema; No deformity  SKIN: Warm and dry NEUROLOGIC:  Alert and oriented x 3 PSYCHIATRIC:  Normal affect   Signed, Garwin Brothers, MD  04/03/2023 9:57 AM    [facility]

## 2023-04-03 NOTE — Patient Instructions (Signed)
 Medication Instructions:  Your physician recommends that you continue on your current medications as directed. Please refer to the Current Medication list given to you today.  *If you need a refill on your cardiac medications before your next appointment, please call your pharmacy*   Lab Work: None Ordered If you have labs (blood work) drawn today and your tests are completely normal, you will receive your results only by: MyChart Message (if you have MyChart) OR A paper copy in the mail If you have any lab test that is abnormal or we need to change your treatment, we will call you to review the results.   Testing/Procedures: None Ordered   Follow-Up: At [facility], you and your health needs are our priority.  As part of our continuing mission to provide you with exceptional heart care, we have created designated Provider Care Teams.  These Care Teams include your primary Cardiologist (physician) and Advanced Practice Providers (APPs -  Physician Assistants and Nurse Practitioners) who all work together to provide you with the care you need, when you need it.  We recommend signing up for the patient portal called "MyChart".  Sign up information is provided on this After Visit Summary.  MyChart is used to connect with patients for Virtual Visits (Telemedicine).  Patients are able to view lab/test results, encounter notes, upcoming appointments, etc.  Non-urgent messages can be sent to your provider as well.   To learn more about what you can do with MyChart, go to [email or web address].    Your next appointment:   12 month follow up

## 2023-12-27 ENCOUNTER — Telehealth (HOSPITAL_BASED_OUTPATIENT_CLINIC_OR_DEPARTMENT_OTHER): Payer: Self-pay | Admitting: Emergency Medicine

## 2023-12-27 ENCOUNTER — Emergency Department (HOSPITAL_BASED_OUTPATIENT_CLINIC_OR_DEPARTMENT_OTHER)

## 2023-12-27 ENCOUNTER — Encounter (HOSPITAL_BASED_OUTPATIENT_CLINIC_OR_DEPARTMENT_OTHER): Payer: Self-pay | Admitting: Emergency Medicine

## 2023-12-27 ENCOUNTER — Emergency Department (HOSPITAL_BASED_OUTPATIENT_CLINIC_OR_DEPARTMENT_OTHER)
Admission: EM | Admit: 2023-12-27 | Discharge: 2023-12-27 | Disposition: A | Discharge status: Home / Self Care | Attending: Emergency Medicine | Admitting: Emergency Medicine

## 2023-12-27 DIAGNOSIS — E876 Hypokalemia: Secondary | ICD-10-CM | POA: Insufficient documentation

## 2023-12-27 DIAGNOSIS — R1905 Periumbilic swelling, mass or lump: Secondary | ICD-10-CM | POA: Diagnosis present

## 2023-12-27 DIAGNOSIS — R1909 Other intra-abdominal and pelvic swelling, mass and lump: Secondary | ICD-10-CM

## 2023-12-27 LAB — BASIC METABOLIC PANEL WITH GFR
Anion gap: 14 (ref 5–15)
BUN: 20 mg/dL (ref 8–23)
CO2: 23 mmol/L (ref 22–32)
Calcium: 9.3 mg/dL (ref 8.9–10.3)
Chloride: 104 mmol/L (ref 98–111)
Creatinine, Ser: 0.94 mg/dL (ref 0.44–1.00)
GFR, Estimated: 59 mL/min — ABNORMAL LOW (ref >60)
Glucose, Bld: 105 mg/dL — ABNORMAL HIGH (ref 70–99)
Potassium: 3.4 mmol/L — ABNORMAL LOW (ref 3.5–5.1)
Sodium: 140 mmol/L (ref 135–145)

## 2023-12-27 LAB — CBC WITH DIFFERENTIAL/PLATELET
Abs Immature Granulocytes: 0.01 K/uL (ref 0.00–0.07)
Basophils Absolute: 0 K/uL (ref 0.0–0.1)
Basophils Relative: 1 %
Eosinophils Absolute: 0 K/uL (ref 0.0–0.5)
Eosinophils Relative: 0 %
HCT: 35.9 % — ABNORMAL LOW (ref 36.0–46.0)
Hemoglobin: 12 g/dL (ref 12.0–15.0)
Immature Granulocytes: 0 %
Lymphocytes Relative: 18 %
Lymphs Abs: 1.1 K/uL (ref 0.7–4.0)
MCH: 27.6 pg (ref 26.0–34.0)
MCHC: 33.4 g/dL (ref 30.0–36.0)
MCV: 82.5 fL (ref 80.0–100.0)
Monocytes Absolute: 0.7 K/uL (ref 0.1–1.0)
Monocytes Relative: 11 %
Neutro Abs: 4.5 K/uL (ref 1.7–7.7)
Neutrophils Relative %: 70 %
Platelets: 217 K/uL (ref 150–400)
RBC: 4.35 MIL/uL (ref 3.87–5.11)
RDW: 15.6 % — ABNORMAL HIGH (ref 11.5–15.5)
WBC: 6.3 K/uL (ref 4.0–10.5)
nRBC: 0 % (ref 0.0–0.2)

## 2023-12-27 MED ORDER — AMOXICILLIN-POT CLAVULANATE 875-125 MG PO TABS: 1.0000 | ORAL_TABLET | Freq: Two times a day (BID) | ORAL | 0 refills | Status: DC

## 2023-12-27 MED ORDER — IOHEXOL 300 MG/ML  SOLN
80.0000 mL | Freq: Once | INTRAMUSCULAR | Status: AC | PRN
  Administered 2023-12-27: 80 mL via INTRAVENOUS

## 2023-12-27 NOTE — ED Provider Notes (Signed)
 [place] [facility] Provider Note   CSN: [id number] Arrival date & time: 12/27/23  1346     Patient presents with: Wound Check   Dominique Ochoa is a 83 y.o. female.  She is presenting with drainage from her umbilicus that started today.  She said it smelled bad.  The area has been itching for a couple of days.  She said she has had a lump there for a while and it does not feel larger.  No real pain.  No fevers chills nausea vomiting.  Moving her bowels okay.  On review does look like she has had a bilateral inguinal hernia with bowel obstruction and has mesh.   {Add pertinent medical, surgical, social history, OB history to YEP:67052} The history is provided by the patient.  Wound Check This is a new problem. The problem has not changed since onset.Pertinent negatives include no chest pain, no abdominal pain, no headaches and no shortness of breath. Nothing aggravates the symptoms. Nothing relieves the symptoms. She has tried nothing for the symptoms. The treatment provided no relief.       Prior to Admission medications   Medication Sig Start Date End Date Taking? Authorizing Provider  albuterol  (PROVENTIL  HFA;VENTOLIN  HFA) 108 (90 Base) MCG/ACT inhaler Inhale 1 puff into the lungs every 6 (six) hours as needed for wheezing or shortness of breath.    [provider]  albuterol  (PROVENTIL ) (2.5 MG/3ML) 0.083% nebulizer solution Take 1 vial by nebulization every 6 (six) hours as needed for wheezing or shortness of breath.  01/28/19   [provider]  amLODipine  (NORVASC ) 5 MG tablet Take 5 mg by mouth daily.    [provider]  cyanocobalamin (,VITAMIN B-12,) 1000 MCG/ML injection Inject 1,000 mcg into the muscle every 30 (thirty) days.    [provider]  levothyroxine  (SYNTHROID , LEVOTHROID) 25 MCG tablet Take 25 mcg by mouth daily before breakfast.    [provider]  montelukast  (SINGULAIR ) 10 MG  tablet Take 10 mg by mouth at bedtime. 03/26/19   [provider]  Multiple Vitamin (MULTIVITAMIN WITH MINERALS) TABS tablet Take 1 tablet by mouth daily.    [provider]  ondansetron  (ZOFRAN -ODT) 4 MG disintegrating tablet Take 1 tablet (4 mg total) by mouth every 8 (eight) hours as needed for nausea or vomiting. 01/09/23   Zackowski, Scott, MD  VITAMIN D PO Take 1 tablet by mouth daily.    [provider]    Allergies: Fish allergy, Fish oil, Lisinopril , and Sulfamethoxazole-trimethoprim    Review of Systems  Constitutional:  Negative for fever.  Respiratory:  Negative for shortness of breath.   Cardiovascular:  Negative for chest pain.  Gastrointestinal:  Negative for abdominal pain, constipation, diarrhea, nausea and vomiting.  Neurological:  Negative for headaches.    Updated Vital Signs BP (!) 161/106 (BP Location: Right Arm)   Pulse 65   Temp 97.7 F (36.5 C) (Oral)   Resp 16   Ht 5' 5 (1.651 m)   Wt 54.4 kg   SpO2 97%   BMI 19.97 kg/m   Physical Exam Vitals and nursing note reviewed.  Constitutional:      General: She is not in acute distress.    Appearance: Normal appearance. She is well-developed.  HENT:     Head: Normocephalic and atraumatic.  Eyes:     Conjunctiva/sclera: Conjunctivae normal.  Cardiovascular:     Rate and Rhythm: Normal rate and regular rhythm.  Heart sounds: No murmur heard. Pulmonary:     Effort: Pulmonary effort is normal. No respiratory distress.     Breath sounds: Normal breath sounds.  Abdominal:     Palpations: Abdomen is soft. There is mass.     Tenderness: There is no abdominal tenderness.     Comments: She has a possible mass at her umbilicus.  I do not see any discharge but it does have a foul smell.  There is no surrounding induration.  Musculoskeletal:        General: No deformity.     Cervical back: Neck supple.  Skin:    General: Skin is warm and dry.     Capillary Refill: Capillary refill  takes less than 2 seconds.  Neurological:     Mental Status: She is alert.  Psychiatric:        Mood and Affect: Mood normal.     (all labs ordered are listed, but only abnormal results are displayed) Labs Reviewed  CBC WITH DIFFERENTIAL/PLATELET  BASIC METABOLIC PANEL WITH GFR    EKG: None  Radiology: No results found.  {Document cardiac monitor, telemetry assessment procedure when appropriate:32947} Procedures   Medications Ordered in the ED - No data to display    {Click here for ABCD2, HEART and other calculators REFRESH Note before signing:1}                              Medical Decision Making Amount and/or Complexity of Data Reviewed Radiology: ordered.   This patient complains of ***; this involves an extensive number of treatment Options and is a complaint that carries with it a high risk of complications and morbidity. The differential includes ***  I ordered, reviewed and interpreted labs, which included *** I ordered medication *** and reviewed PMP when indicated. I ordered imaging studies which included *** and I independently    visualized and interpreted imaging which showed *** Additional history obtained from *** Previous records obtained and reviewed *** I consulted *** and discussed lab and imaging findings and discussed disposition.  Cardiac monitoring reviewed, *** Social determinants considered, *** Critical Interventions: ***  After the interventions stated above, I reevaluated the patient and found *** Admission and further testing considered, ***   {Document critical care time when appropriate  Document review of labs and clinical decision tools ie CHADS2VASC2, etc  Document your independent review of radiology images and any outside records  Document your discussion with family members, caretakers and with consultants  Document social determinants of health affecting pt's care  Document your decision making why or why not admission,  treatments were needed:32947:::1}   Final diagnoses:  None    ED Discharge Orders     None

## 2023-12-27 NOTE — ED Notes (Signed)
Urine specimen in lab 

## 2023-12-27 NOTE — Telephone Encounter (Signed)
 Changed pharmacy for Augmentin 

## 2023-12-27 NOTE — Discharge Instructions (Addendum)
 You were seen in the emergency department for drainage from your bellybutton.  Your CAT scan showed a mass there.  We are treating you with antibiotics.  Will be important to follow-up with your primary care doctor and you may also need to see surgery to make sure this does not need to be removed.

## 2023-12-27 NOTE — ED Triage Notes (Signed)
 Pt sts she noticed some greenish liquid draining from umbilicus today; denies pain; no recent surgeries; umbilicus is firm to touch, pt sts this is normal for her

## 2024-01-06 ENCOUNTER — Encounter (HOSPITAL_COMMUNITY): Payer: Self-pay | Admitting: General Surgery

## 2024-01-13 ENCOUNTER — Ambulatory Visit: Payer: Self-pay | Admitting: General Surgery

## 2024-01-13 NOTE — H&P (Signed)
 Chief Complaint: NEW PROBLEM (Umbilical mass)       History of Present Illness: Dominique Ochoa is a 83 y.o. female who is seen today for    History of Present Illness Dominique Ochoa is an 83 year old female with prior umbilical hernia repair who presents with a persistent umbilical mass and drainage.   She has had clear, foul-smelling umbilical drainage with overlying skin changes for about one month, first noticed after changing her body wash, without pain, and did not improve with topical treatment at home.   She notes a persistent, non-tender area beneath the umbilicus for two to three months without bulging or discomfort and denies fever, chills, or malaise.   She had an umbilical hernia repair in 2022 without complications and is not taking antibiotics or antifungals for this problem.            Review of Systems: A complete review of systems was obtained from the patient.  I have reviewed this information and discussed as appropriate with the patient.  See HPI as well for other ROS.   Review of Systems  Constitutional:  Negative for fever.  HENT:  Negative for congestion.   Eyes:  Negative for blurred vision.  Respiratory:  Negative for cough, shortness of breath and wheezing.   Cardiovascular:  Negative for chest pain and palpitations.  Gastrointestinal:  Negative for heartburn.  Genitourinary:  Negative for dysuria.  Musculoskeletal:  Negative for myalgias.  Skin:  Negative for rash.  Neurological:  Negative for dizziness and headaches.  Psychiatric/Behavioral:  Negative for depression and suicidal ideas.   All other systems reviewed and are negative.       Medical History: Past Medical History Past Medical History: Diagnosis Date  Asthma, unspecified asthma severity, unspecified whether complicated, unspecified whether persistent ([facility]-HCC)    GERD (gastroesophageal reflux disease)    Hypertension    Thyroid  disease         Problem List There is no  problem list on file for this patient.     Past Surgical History History reviewed. No pertinent surgical history.     Allergies Allergies Allergen Reactions  Fish Oil Other (See Comments) and Swelling     MD orders    Lisinopril  Swelling     Facial swelling Facial swelling    Sulfamethoxazole-Trimethoprim Itching and Swelling     Facial swelling Facial swelling        Medications Ordered Prior to Encounter Current Outpatient Medications on File Prior to Visit Medication Sig Dispense Refill  albuterol  (ACCUNEB ) 0.63 mg/3 mL nebulizer solution Inhale 0.63 mg into the lungs every 6 (six) hours as needed      amLODIPine  (NORVASC ) 5 MG tablet Take 5 mg by mouth once daily      ergocalciferol, vitamin D2, 1,250 mcg (50,000 unit) capsule Take 1 capsule by mouth once a week      levothyroxine  (SYNTHROID ) 25 MCG tablet TAKE 1 TABLET(25 MCG) BY MOUTH DAILY AT 6 AM      montelukast  (SINGULAIR ) 10 mg tablet Take 10 mg by mouth at bedtime        No current facility-administered medications on file prior to visit.      Family History History reviewed. No pertinent family history.     Tobacco Use History Social History    Tobacco Use Smoking Status Never Smokeless Tobacco Never      Social History Social History    Socioeconomic History  Marital status: Single Tobacco Use  Smoking status: Never  Smokeless tobacco: Never Vaping Use  Vaping status: Never Used Substance and Sexual Activity  Alcohol use: Never  Drug use: Never    Social Drivers of Health    Food Insecurity: Low Risk (10/14/2023)   Received from [facility]   Hunger Vital Sign    Within the past 12 months, you worried that your food would run out before you got money to buy more: Never true    Within the past 12 months, the food you bought just didn't last and you didn't have money to get more. : Never true Transportation Needs: No Transportation Needs (10/14/2023)   Received from [employer]    In the past 12 months, has lack of reliable transportation kept you from medical appointments, meetings, work or from getting things needed for daily living? : No Housing Stability: Unknown (01/13/2024)   Housing Stability Vital Sign    Homeless in the Last Year: No      Objective:     Vitals:   01/13/24 1406 01/13/24 1407 01/13/24 1419 Pulse: 86     Resp: 14     Temp: 36.4 C (97.6 F)     SpO2: 98%     Weight: 54.2 kg (119 lb 6.4 oz)   54 kg (119 lb 0.8 oz) Height: 165.1 cm (5' 5)     PainSc:   0-No pain     Body mass index is 19.81 kg/m.   Physical Exam Constitutional:      Appearance: Normal appearance.  HENT:     Head: Normocephalic and atraumatic.     Mouth/Throat:     Mouth: Mucous membranes are moist.     Pharynx: Oropharynx is clear.  Eyes:     General: No scleral icterus.    Pupils: Pupils are equal, round, and reactive to light.  Cardiovascular:     Rate and Rhythm: Normal rate and regular rhythm.     Pulses: Normal pulses.     Heart sounds: No murmur heard.    No friction rub. No gallop.  Pulmonary:     Effort: Pulmonary effort is normal. No respiratory distress.     Breath sounds: Normal breath sounds. No stridor.  Abdominal:     General: Abdomen is flat.      Comments: 3x3cm umb mass  Musculoskeletal:        General: No swelling.  Skin:    General: Skin is warm.  Neurological:     General: No focal deficit present.     Mental Status: She is alert and oriented to person, place, and time. Mental status is at baseline.  Psychiatric:        Mood and Affect: Mood normal.        Thought Content: Thought content normal.        Judgment: Judgment normal.       Assessment and Plan:   Assessment Diagnoses and all orders for this visit:   Umbilical mass       Assessment & Plan Umbilical mass with drainage Chronic, firm 3 cm subcutaneous umbilical mass with clear, malodorous drainage. Differential includes  subcutaneous infection, abscess, or neoplastic process. CT imaging shows no communication with the abdominal cavity. Prior umbilical hernia repair and possible previous drain site may complicate anatomy. Lack of improvement necessitates surgical excision for definitive diagnosis and management. - Scheduled surgical excision of the umbilical mass under anesthesia with intraoperative drainage and excision of the entire umbilical area as  indicated. - Planned to send excised tissue for pathological evaluation to determine definitive diagnosis. - Provided anticipatory guidance regarding outpatient procedure, expected recovery time (1-2 weeks), and potential postoperative pain/discomfort. - Discussed surgical risks including pain, discomfort, and postoperative infection. Further management will depend on intraoperative findings and pathology results.   Cutaneous candidiasis of the umbilicus Malodor and moist environment of the umbilicus consistent with superficial fungal infection, likely candidiasis. No evidence of systemic infection or deeper involvement. - Prescribed nystatin powder for topical application to the umbilical area to reduce fungal colonization and control odor until surgery. - Advised to keep the area as dry as possible until surgical intervention.     No follow-ups on file.   LYNDA LEOS, MD

## 2024-02-13 ENCOUNTER — Encounter (HOSPITAL_COMMUNITY): Payer: Self-pay

## 2024-02-13 NOTE — Pre-Procedure Instructions (Signed)
 Surgical Instructions   Your procedure is scheduled on Thursday, January 22nd. Report to [facility] Main Entrance A at 08:30 A.M., then check in with the Admitting office. Any questions or running late day of surgery: call [phone]  Questions prior to your surgery date: call [phone], Monday-Friday, 8am-4pm. If you experience any cold or flu symptoms such as cough, fever, chills, shortness of breath, etc. between now and your scheduled surgery, please notify us  at the above number.     Remember:  Do not eat after midnight the night before your surgery   You may drink clear liquids until 07:30 AM the morning of your surgery.   Clear liquids allowed are: Water, Non-Citrus Juices (without pulp), Carbonated Beverages, Clear Tea (no milk, honey, etc.), Black Coffee Only (NO MILK, CREAM OR POWDERED CREAMER of any kind), and Gatorade.  Patient Instructions  The night before surgery:  No food after midnight. ONLY clear liquids after midnight  The day of surgery (if you do NOT have diabetes):  Drink ONE (1) Pre-Surgery Clear Ensure by 07:30 AM the morning of surgery. Drink in one sitting. Do not sip.  This drink was given to you during your hospital  pre-op appointment visit.  Nothing else to drink after completing the  Pre-Surgery Clear Ensure.          If you have questions, please contact your surgeons office.    Take these medicines the morning of surgery with A SIP OF WATER amLODipine  (NORVASC )  levothyroxine  (SYNTHROID , LEVOTHROID)   May take these medicines IF NEEDED: albuterol  (PROVENTIL  HFA;VENTOLIN  HFA)- bring inhaler with you albuterol  (PROVENTIL )   One week prior to surgery, STOP taking any Aspirin  (unless otherwise instructed by your surgeon) Aleve, Naproxen, Ibuprofen , Motrin , Advil , Goody's, BC's, all herbal medications, fish oil, and non-prescription vitamins.                     Do NOT Smoke (Tobacco/Vaping) for 24 hours prior to your procedure.  If  you use a CPAP at night, you may bring your mask/headgear for your overnight stay.   You will be asked to remove any contacts, glasses, piercing's, hearing aid's, dentures/partials prior to surgery. Please bring cases for these items if needed.    Your surgeon will determine if you are to be admitted or discharged the same day.  Patients discharged the day of surgery will not be allowed to drive home, and someone needs to stay with them for 24 hours.  SURGICAL WAITING ROOM VISITATION Patients may have no more than 2 support people in the waiting area - these visitors may rotate.   Pre-op nurse will coordinate an appropriate time for 2 ADULT support persons, who may not rotate, to accompany patient in pre-op.  Children under the age of 28 must have an adult with them who is not the patient and must remain in the main waiting area with an adult.  If the patient needs to stay at the hospital during part of their recovery, the visitor guidelines for inpatient rooms apply.  Please refer to the [facility] website for the visitor guidelines for any additional information.   If you received a COVID test during your pre-op visit  it is requested that you wear a mask when out in public, stay away from anyone that may not be feeling well and notify your surgeon if you develop symptoms. If you have been in contact with anyone that has tested positive in the last 10 days please  notify you surgeon.      Pre-operative CHG Bathing Instructions   You can play a key role in reducing the risk of infection after surgery. Your skin needs to be as free of germs as possible. You can reduce the number of germs on your skin by washing with CHG (chlorhexidine  gluconate) soap before surgery. CHG is an antiseptic soap that kills germs and continues to kill germs even after washing.   DO NOT use if you have an allergy to chlorhexidine /CHG or antibacterial soaps. If your skin becomes reddened or irritated, stop using  the CHG and notify one of our RNs at [phone].              TAKE A SHOWER THE NIGHT BEFORE SURGERY   Please keep in mind the following:  DO NOT shave, including legs and underarms, 48 hours prior to surgery.   You may shave your face before/day of surgery.  Place clean sheets on your bed the night before surgery Use a clean washcloth (not used since being washed) for shower. DO NOT sleep with pet's night before surgery.  CHG Shower Instructions:  Wash your face and private area with normal soap. If you choose to wash your hair, wash first with your normal shampoo.  After you use shampoo/soap, rinse your hair and body thoroughly to remove shampoo/soap residue.  Turn the water OFF and apply half the bottle of CHG soap to a CLEAN washcloth.  Apply CHG soap ONLY FROM YOUR NECK DOWN TO YOUR TOES (washing for 3-5 minutes)  DO NOT use CHG soap on face, private areas, open wounds, or sores.  Pay special attention to the area where your surgery is being performed.  If you are having back surgery, having someone wash your back for you may be helpful. Wait 2 minutes after CHG soap is applied, then you may rinse off the CHG soap.  Pat dry with a clean towel  Put on clean pajamas    Additional instructions for the day of surgery: If you choose, you may shower the morning of surgery with an antibacterial soap.  DO NOT APPLY any lotions, deodorants, cologne, or perfumes.   Do not wear jewelry or makeup Do not wear nail polish, gel polish, artificial nails, or any other type of covering on natural nails (fingers and toes) Do not bring valuables to the hospital. [facility] is not responsible for valuables/personal belongings. Put on clean/comfortable clothes.  Please brush your teeth.  Ask your nurse before applying any prescription medications to the skin.

## 2024-02-13 NOTE — Progress Notes (Signed)
 PCP - Jon Berth, NP Cardiologist - Dr Jennifer Crape  Chest x-ray - n/a EKG - 04/03/23 Stress Test - 09/16/12 ECHO - 03/21/22 Cardiac Cath - n/a  ICD Pacemaker/Loop - n/a  Sleep Study -  Yes CPAP - does not use CPAP, pt. Lost weight  Diabetes - n/a  Aspirin  & Blood Thinner Instructions:  n/a  ERAS - clear liquids til 0730 DOS PRE-SURGERY G2 drink given with instructions.   Anesthesia review: Yes  STOP now taking any Aspirin  (unless otherwise instructed by your surgeon), Aleve, Naproxen, Ibuprofen , Motrin , Advil , Goody's, BC's, all herbal medications, fish oil, and all vitamins.   Coronavirus Screening Do you have any of the following symptoms:  Cough yes/no: No Fever (>100.26F)  yes/no: No Runny nose occasional r/t allergies Sore throat yes/no: No Difficulty breathing/shortness of breath  yes/no: No  Have you traveled in the last 14 days and where? yes/no: No  Patient verbalized understanding of instructions that were given to them at the PAT appointment. Patient was also instructed that they will need to review over the PAT instructions again at home before surgery.

## 2024-02-16 ENCOUNTER — Encounter (HOSPITAL_COMMUNITY)
Admission: RE | Admit: 2024-02-16 | Discharge: 2024-02-16 | Disposition: A | Source: Ambulatory Visit | Attending: General Surgery

## 2024-02-16 ENCOUNTER — Encounter (HOSPITAL_COMMUNITY): Payer: Self-pay

## 2024-02-16 ENCOUNTER — Other Ambulatory Visit: Payer: Self-pay

## 2024-02-16 VITALS — BP 162/71 | HR 50 | Temp 97.8°F | Resp 17 | Ht 65.0 in | Wt 126.0 lb

## 2024-02-16 DIAGNOSIS — Z01812 Encounter for preprocedural laboratory examination: Secondary | ICD-10-CM | POA: Insufficient documentation

## 2024-02-16 DIAGNOSIS — E039 Hypothyroidism, unspecified: Secondary | ICD-10-CM | POA: Insufficient documentation

## 2024-02-16 DIAGNOSIS — G4733 Obstructive sleep apnea (adult) (pediatric): Secondary | ICD-10-CM | POA: Insufficient documentation

## 2024-02-16 DIAGNOSIS — E119 Type 2 diabetes mellitus without complications: Secondary | ICD-10-CM | POA: Insufficient documentation

## 2024-02-16 DIAGNOSIS — I10 Essential (primary) hypertension: Secondary | ICD-10-CM | POA: Diagnosis not present

## 2024-02-16 DIAGNOSIS — J4520 Mild intermittent asthma, uncomplicated: Secondary | ICD-10-CM | POA: Insufficient documentation

## 2024-02-16 DIAGNOSIS — I083 Combined rheumatic disorders of mitral, aortic and tricuspid valves: Secondary | ICD-10-CM | POA: Diagnosis not present

## 2024-02-16 DIAGNOSIS — Z01818 Encounter for other preprocedural examination: Secondary | ICD-10-CM

## 2024-02-16 DIAGNOSIS — K219 Gastro-esophageal reflux disease without esophagitis: Secondary | ICD-10-CM | POA: Insufficient documentation

## 2024-02-16 HISTORY — DX: Presence of dental prosthetic device (complete) (partial): Z97.2

## 2024-02-16 HISTORY — DX: Presence of external hearing-aid: Z97.4

## 2024-02-16 LAB — CBC
HCT: 35.2 % — ABNORMAL LOW (ref 36.0–46.0)
Hemoglobin: 11.5 g/dL — ABNORMAL LOW (ref 12.0–15.0)
MCH: 27.4 pg (ref 26.0–34.0)
MCHC: 32.7 g/dL (ref 30.0–36.0)
MCV: 83.8 fL (ref 80.0–100.0)
Platelets: 213 K/uL (ref 150–400)
RBC: 4.2 MIL/uL (ref 3.87–5.11)
RDW: 15.4 % (ref 11.5–15.5)
WBC: 4.6 K/uL (ref 4.0–10.5)
nRBC: 0 % (ref 0.0–0.2)

## 2024-02-16 LAB — BASIC METABOLIC PANEL WITH GFR
Anion gap: 11 (ref 5–15)
BUN: 17 mg/dL (ref 8–23)
CO2: 25 mmol/L (ref 22–32)
Calcium: 9 mg/dL (ref 8.9–10.3)
Chloride: 103 mmol/L (ref 98–111)
Creatinine, Ser: 0.96 mg/dL (ref 0.44–1.00)
GFR, Estimated: 58 mL/min — ABNORMAL LOW
Glucose, Bld: 108 mg/dL — ABNORMAL HIGH (ref 70–99)
Potassium: 3.2 mmol/L — ABNORMAL LOW (ref 3.5–5.1)
Sodium: 139 mmol/L (ref 135–145)

## 2024-02-17 NOTE — Progress Notes (Signed)
 Anesthesia Chart Review:  84 year old female follows with cardiology for history of HTN and OSA not on CPAP (states no longer needed after weight loss).  Echo 02/2022 showed LVEF 65%, normal wall motion, normal RV, mild to moderate mitral regurgitation.  Nuclear stress 08/2015 was low risk.  Last seen in follow-up by Dr. Edwyna on 04/03/2023, stable at that time, no changes to management, 1 year follow-up recommended.  Other pertinent history includes hypothyroidism, GERD, mild intermittent asthma, CKD 2.  Preop labs reviewed, mild hypokalemia potassium 3.2, mild anemia hemoglobin 11.5, otherwise unremarkable.  EKG 04/03/2023: Sinus tachycardia with frequent PVCs.  Rate 101.  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  Septal infarct, age undetermined.  No significant change.  TTE 03/21/2022:  1. Left ventricular ejection fraction, by estimation, is 60 to 65%. The  left ventricle has normal function. The left ventricle has no regional  wall motion abnormalities. Left ventricular diastolic parameters are  indeterminate.   2. Right ventricular systolic function is normal. The right ventricular  size is normal. There is moderately elevated pulmonary artery systolic  pressure.   3. Left atrial size was mildly dilated.   4. The mitral valve is normal in structure. Mild to moderate mitral valve  regurgitation. No evidence of mitral stenosis.   5. Tricuspid valve regurgitation is moderate.   6. The aortic valve is calcified. There is mild calcification of the  aortic valve. There is mild thickening of the aortic valve. Aortic valve  regurgitation is trivial. Aortic valve sclerosis/calcification is present,  without any evidence of aortic  stenosis.   7. The inferior vena cava is normal in size with greater than 50%  respiratory variability, suggesting right atrial pressure of 3 mmHg.     Lynwood Geofm RIGGERS [facility] Stay [facility]/Anesthesiology Phone [phone] 02/17/2024  3:02 PM

## 2024-02-17 NOTE — Anesthesia Preprocedure Evaluation (Signed)
 "                                  Anesthesia Evaluation  Patient identified by MRN, date of birth, ID band Patient awake    Reviewed: Allergy & Precautions, NPO status , Patient's Chart, lab work & pertinent test results  Airway Mallampati: III  TM Distance: >3 FB Neck ROM: Full    Dental  (+) Dental Advisory Given, Partial Upper, Missing,    Pulmonary neg shortness of breath, asthma , sleep apnea , neg COPD, neg recent URI   breath sounds clear to auscultation       Cardiovascular hypertension, Pt. on medications (-) angina (-) Past MI and (-) CHF (-) dysrhythmias  Rhythm:Regular  1. Left ventricular ejection fraction, by estimation, is 60 to 65%. The  left ventricle has normal function. The left ventricle has no regional  wall motion abnormalities. Left ventricular diastolic parameters are  indeterminate.   2. Right ventricular systolic function is normal. The right ventricular  size is normal. There is moderately elevated pulmonary artery systolic  pressure.   3. Left atrial size was mildly dilated.   4. The mitral valve is normal in structure. Mild to moderate mitral valve  regurgitation. No evidence of mitral stenosis.   5. Tricuspid valve regurgitation is moderate.   6. The aortic valve is calcified. There is mild calcification of the  aortic valve. There is mild thickening of the aortic valve. Aortic valve  regurgitation is trivial. Aortic valve sclerosis/calcification is present,  without any evidence of aortic  stenosis.   7. The inferior vena cava is normal in size with greater than 50%  respiratory variability, suggesting right atrial pressure of 3 mmHg.     Neuro/Psych    GI/Hepatic Neg liver ROS,GERD  Controlled,,  Endo/Other  Hypothyroidism    Renal/GU CRFRenal diseaseLab Results      Component                Value               Date                      NA                       139                 02/16/2024                K                         3.2 (L)             02/16/2024                CO2                      25                  02/16/2024                GLUCOSE                  108 (H)             02/16/2024  BUN                      17                  02/16/2024                CREATININE               0.96                02/16/2024                CALCIUM                  9.0                 02/16/2024                EGFR                     60                  03/21/2022                GFRNONAA                 58 (L)              02/16/2024                Musculoskeletal   Abdominal   Peds  Hematology  (+) Blood dyscrasia, anemia Lab Results      Component                Value               Date                      WBC                      4.6                 02/16/2024                HGB                      11.5 (L)            02/16/2024                HCT                      35.2 (L)            02/16/2024                MCV                      83.8                02/16/2024                PLT                      213                 02/16/2024              Anesthesia Other Findings  Reproductive/Obstetrics                              Anesthesia Physical Anesthesia Plan  ASA: 3  Anesthesia Plan: General   Post-op Pain Management: Tylenol  PO (pre-op)*   Induction: Intravenous  PONV Risk Score and Plan: 4 or greater and Ondansetron  and Dexamethasone   Airway Management Planned: Oral ETT and LMA  Additional Equipment: None  Intra-op Plan:   Post-operative Plan: Extubation in OR  Informed Consent: I have reviewed the patients History and Physical, chart, labs and discussed the procedure including the risks, benefits and alternatives for the proposed anesthesia with the patient or authorized representative who has indicated his/her understanding and acceptance.     Dental advisory given  Plan Discussed with: CRNA  Anesthesia Plan Comments:  (PAT note by Lynwood Hope, PA-C: 84 year old female follows with cardiology for history of HTN and OSA not on CPAP (states no longer needed after weight loss).  Echo 02/2022 showed LVEF 65%, normal wall motion, normal RV, mild to moderate mitral regurgitation.  Nuclear stress 08/2015 was low risk.  Last seen in follow-up by Dr. Edwyna on 04/03/2023, stable at that time, no changes to management, 1 year follow-up recommended.  Other pertinent history includes hypothyroidism, GERD, mild intermittent asthma, CKD 2.  Preop labs reviewed, mild hypokalemia potassium 3.2, mild anemia hemoglobin 11.5, otherwise unremarkable.  EKG 04/03/2023: Sinus tachycardia with frequent PVCs.  Rate 101.  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  Septal infarct, age undetermined.  No significant change.  TTE 03/21/2022: 1. Left ventricular ejection fraction, by estimation, is 60 to 65%. The  left ventricle has normal function. The left ventricle has no regional  wall motion abnormalities. Left ventricular diastolic parameters are  indeterminate.  2. Right ventricular systolic function is normal. The right ventricular  size is normal. There is moderately elevated pulmonary artery systolic  pressure.  3. Left atrial size was mildly dilated.  4. The mitral valve is normal in structure. Mild to moderate mitral valve  regurgitation. No evidence of mitral stenosis.  5. Tricuspid valve regurgitation is moderate.  6. The aortic valve is calcified. There is mild calcification of the  aortic valve. There is mild thickening of the aortic valve. Aortic valve  regurgitation is trivial. Aortic valve sclerosis/calcification is present,  without any evidence of aortic  stenosis.  7. The inferior vena cava is normal in size with greater than 50%  respiratory variability, suggesting right atrial pressure of 3 mmHg.    )         Anesthesia [employer] Evaluation  "

## 2024-02-18 NOTE — Progress Notes (Signed)
 Patient made aware of new surgery time at [phone] and asked to arrive by [phone]. Also instructed to drink clear ensure drink by 0550 and to stop all clear liquids after. Patient voiced understanding.

## 2024-02-18 NOTE — H&P (Signed)
 " Chief Complaint: NEW PROBLEM (Umbilical mass)       History of Present Illness: Dominique Ochoa is a 84 y.o. female who is seen today for    History of Present Illness Dominique Ochoa is an 84 year old female with prior umbilical hernia repair who presents with a persistent umbilical mass and drainage.   She has had clear, foul-smelling umbilical drainage with overlying skin changes for about one month, first noticed after changing her body wash, without pain, and did not improve with topical treatment at home.   She notes a persistent, non-tender area beneath the umbilicus for two to three months without bulging or discomfort and denies fever, chills, or malaise.   She had an umbilical hernia repair in 2022 without complications and is not taking antibiotics or antifungals for this problem.             Review of Systems: A complete review of systems was obtained from the patient.  I have reviewed this information and discussed as appropriate with the patient.  See HPI as well for other ROS.   Review of Systems  Constitutional:  Negative for fever.  HENT:  Negative for congestion.   Eyes:  Negative for blurred vision.  Respiratory:  Negative for cough, shortness of breath and wheezing.   Cardiovascular:  Negative for chest pain and palpitations.  Gastrointestinal:  Negative for heartburn.  Genitourinary:  Negative for dysuria.  Musculoskeletal:  Negative for myalgias.  Skin:  Negative for rash.  Neurological:  Negative for dizziness and headaches.  Psychiatric/Behavioral:  Negative for depression and suicidal ideas.   All other systems reviewed and are negative.       Medical History: Past Medical History Past Medical History: DiagnosisDate            Asthma, unspecified asthma severity, unspecified whether complicated, unspecified whether persistent (HHS-HCC)               GERD (gastroesophageal reflux disease)                   Hypertension                Thyroid   disease                    Problem List There is no problem list on file for this patient.     Past Surgical History History reviewed. No pertinent surgical history.     Allergies Allergies AllergenReactions Fish OilOther (See Comments) and Swelling                         MD orders   LisinoprilSwelling                         Facial swelling Facial swelling   Sulfamethoxazole-TrimethoprimItching and Swelling                         Facial swelling Facial swelling         Medications Ordered Prior to Encounter Current Outpatient Medications on File Prior to Visit MedicationSigDispenseRefill            albuterol  (ACCUNEB ) 0.63 mg/3 mL nebulizer solution        Inhale 0.63 mg into the lungs every 6 (six) hours as needed  amLODIPine  (NORVASC ) 5 MG tablet          Take 5 mg by mouth once daily                                  ergocalciferol, vitamin D2, 1,250 mcg (50,000 unit) capsule Take 1 capsule by mouth once a week                             levothyroxine  (SYNTHROID ) 25 MCG tablet TAKE 1 TABLET(25 MCG) BY MOUTH DAILY AT 6 AM                                montelukast  (SINGULAIR ) 10 mg tablet        Take 10 mg by mouth at bedtime                         No current facility-administered medications on file prior to visit.       Family History History reviewed. No pertinent family history.     Tobacco Use History Social History     Tobacco Use Smoking StatusNever Smokeless TobaccoNever       Social History Social History     Socioeconomic History Marital status:Single Tobacco Use Smoking status:Never Smokeless tobacco:Never Vaping Use Vaping status:Never Used Substance and Sexual Activity Alcohol ldz:Wzczm Drug ldz:Wzczm     Social Drivers of Health     Food Insecurity: Low Risk (10/14/2023)             Received from [facility]             Hunger Vital Sign                        Within the past 12 months,  you worried that your food would run out before you got money to buy more: Never true                        Within the past 12 months, the food you bought just didn't last and you didn't have money to get more. : Never true Transportation Needs: No Transportation Needs (10/14/2023)             Received from [employer]                        In the past 12 months, has lack of reliable transportation kept you from medical appointments, meetings, work or from getting things needed for daily living? : No Housing Stability: Unknown (01/13/2024)             Housing Stability Vital Sign                        Homeless in the Last Year: No       Objective:     Vitals:           Pulse:  86                      Resp:   14  Temp:  36.4 C (97.6 F)                       SpO2:  98%                   Weight:            54.2 kg (119 lb 6.4 oz)                        54 kg (119 lb 0.8 oz) Height: 165.1 cm (5' 5)                         PainSc:                        0-No pain            Body mass index is 19.81 kg/m.   Physical Exam Constitutional:      Appearance: Normal appearance.  HENT:     Head: Normocephalic and atraumatic.     Mouth/Throat:     Mouth: Mucous membranes are moist.     Pharynx: Oropharynx is clear.  Eyes:     General: No scleral icterus.    Pupils: Pupils are equal, round, and reactive to light.  Cardiovascular:     Rate and Rhythm: Normal rate and regular rhythm.     Pulses: Normal pulses.     Heart sounds: No murmur heard.    No friction rub. No gallop.  Pulmonary:     Effort: Pulmonary effort is normal. No respiratory distress.     Breath sounds: Normal breath sounds. No stridor.  Abdominal:     General: Abdomen is flat.       Comments: 3x3cm umb mass  Musculoskeletal:        General: No swelling.  Skin:    General: Skin is warm.  Neurological:     General: No focal deficit present.     Mental Status:  She is alert and oriented to person, place, and time. Mental status is at baseline.  Psychiatric:        Mood and Affect: Mood normal.        Thought Content: Thought content normal.        Judgment: Judgment normal.        Assessment and Plan:   Assessment Diagnoses and all orders for this visit:   Umbilical mass       Assessment & Plan Umbilical mass with drainage Chronic, firm 3 cm subcutaneous umbilical mass with clear, malodorous drainage. Differential includes subcutaneous infection, abscess, or neoplastic process. CT imaging shows no communication with the abdominal cavity. Prior umbilical hernia repair and possible previous drain site may complicate anatomy. Lack of improvement necessitates surgical excision for definitive diagnosis and management. - Scheduled surgical excision of the umbilical mass under anesthesia with intraoperative drainage and excision of the entire umbilical area as indicated. - Planned to send excised tissue for pathological evaluation to determine definitive diagnosis. - Provided anticipatory guidance regarding outpatient procedure, expected recovery time (1-2 weeks), and potential postoperative pain/discomfort. - Discussed surgical risks including pain, discomfort, and postoperative infection. Further management will depend on intraoperative findings and pathology results.   Cutaneous candidiasis of the umbilicus Malodor and moist environment of the umbilicus consistent with superficial fungal infection, likely candidiasis. No evidence of systemic infection or deeper involvement. - Prescribed nystatin powder  for topical application to the umbilical area to reduce fungal colonization and control odor until surgery. - Advised to keep the area as dry as possible until surgical intervention.     No follow-ups on file.   LYNDA LEOS, MD  "

## 2024-02-19 ENCOUNTER — Other Ambulatory Visit: Payer: Self-pay

## 2024-02-19 ENCOUNTER — Ambulatory Visit (HOSPITAL_COMMUNITY): Admitting: Anesthesiology

## 2024-02-19 ENCOUNTER — Encounter (HOSPITAL_COMMUNITY): Payer: Self-pay | Admitting: General Surgery

## 2024-02-19 ENCOUNTER — Ambulatory Visit (HOSPITAL_COMMUNITY)
Admission: RE | Admit: 2024-02-19 | Discharge: 2024-02-19 | Disposition: A | Discharge status: Home / Self Care | Attending: General Surgery | Admitting: General Surgery

## 2024-02-19 ENCOUNTER — Encounter (HOSPITAL_COMMUNITY): Admitting: Physician Assistant

## 2024-02-19 ENCOUNTER — Encounter (HOSPITAL_COMMUNITY)
Admission: RE | Disposition: A | Discharge status: Home / Self Care | Payer: Self-pay | Source: Home / Self Care | Attending: General Surgery

## 2024-02-19 MED ORDER — CHLORHEXIDINE GLUCONATE CLOTH 2 % EX PADS: 6.0000 | MEDICATED_PAD | Freq: Once | CUTANEOUS | Status: DC

## 2024-02-19 MED ORDER — PROPOFOL 500 MG/50ML IV EMUL
INTRAVENOUS | Status: DC | PRN
  Administered 2024-02-19: 50 ug/kg/min via INTRAVENOUS

## 2024-02-19 MED ORDER — SUGAMMADEX SODIUM 200 MG/2ML IV SOLN
INTRAVENOUS | Status: DC | PRN
  Administered 2024-02-19: 200 mg via INTRAVENOUS

## 2024-02-19 MED ORDER — ONDANSETRON HCL 4 MG/2ML IJ SOLN
INTRAMUSCULAR | Status: AC
  Filled 2024-02-19: qty 2

## 2024-02-19 MED ORDER — PHENYLEPHRINE 80 MCG/ML (10ML) SYRINGE FOR IV PUSH (FOR BLOOD PRESSURE SUPPORT)
PREFILLED_SYRINGE | INTRAVENOUS | Status: AC
  Filled 2024-02-19: qty 10

## 2024-02-19 MED ORDER — PROPOFOL 10 MG/ML IV BOLUS
INTRAVENOUS | Status: DC | PRN
  Administered 2024-02-19: 20 mg via INTRAVENOUS
  Administered 2024-02-19: 90 mg via INTRAVENOUS
  Administered 2024-02-19: 60 mg via INTRAVENOUS
  Administered 2024-02-19: 30 mg via INTRAVENOUS

## 2024-02-19 MED ORDER — ROCURONIUM BROMIDE 100 MG/10ML IV SOLN
INTRAVENOUS | Status: DC | PRN
  Administered 2024-02-19: 35 mg via INTRAVENOUS

## 2024-02-19 MED ORDER — FENTANYL CITRATE (PF) 100 MCG/2ML IJ SOLN
INTRAMUSCULAR | Status: AC
  Filled 2024-02-19: qty 2

## 2024-02-19 MED ORDER — FENTANYL CITRATE (PF) 100 MCG/2ML IJ SOLN: 25.0000 ug | INTRAMUSCULAR | Status: DC | PRN

## 2024-02-19 MED ORDER — KETOROLAC TROMETHAMINE 30 MG/ML IJ SOLN
INTRAMUSCULAR | Status: AC
  Filled 2024-02-19: qty 1

## 2024-02-19 MED ORDER — OXYCODONE HCL 5 MG PO TABS: 5.0000 mg | ORAL_TABLET | Freq: Once | ORAL | Status: DC | PRN

## 2024-02-19 MED ORDER — OXYCODONE HCL 5 MG/5ML PO SOLN: 5.0000 mg | Freq: Once | ORAL | Status: DC | PRN

## 2024-02-19 MED ORDER — TRAMADOL HCL 50 MG PO TABS: 50.0000 mg | ORAL_TABLET | Freq: Four times a day (QID) | ORAL | 0 refills | Status: AC | PRN

## 2024-02-19 MED ORDER — CEFAZOLIN SODIUM-DEXTROSE 2-4 GM/100ML-% IV SOLN
2.0000 g | INTRAVENOUS | Status: AC
  Administered 2024-02-19: 2 g via INTRAVENOUS
  Filled 2024-02-19: qty 100

## 2024-02-19 MED ORDER — ACETAMINOPHEN 500 MG PO TABS
1000.0000 mg | ORAL_TABLET | ORAL | Status: AC
  Administered 2024-02-19: 1000 mg via ORAL
  Filled 2024-02-19: qty 2

## 2024-02-19 MED ORDER — ACETAMINOPHEN 10 MG/ML IV SOLN: 1000.0000 mg | Freq: Once | INTRAVENOUS | Status: DC | PRN

## 2024-02-19 MED ORDER — BUPIVACAINE-EPINEPHRINE 0.5% -1:200000 IJ SOLN
INTRAMUSCULAR | Status: DC | PRN
  Administered 2024-02-19: 30 mL

## 2024-02-19 MED ORDER — FENTANYL CITRATE (PF) 100 MCG/2ML IJ SOLN
INTRAMUSCULAR | Status: DC | PRN
  Administered 2024-02-19 (×2): 25 ug via INTRAVENOUS

## 2024-02-19 MED ORDER — ORAL CARE MOUTH RINSE: 15.0000 mL | Freq: Once | OROMUCOSAL | Status: AC

## 2024-02-19 MED ORDER — DEXAMETHASONE SOD PHOSPHATE PF 10 MG/ML IJ SOLN
INTRAMUSCULAR | Status: AC
  Filled 2024-02-19: qty 1

## 2024-02-19 MED ORDER — EPHEDRINE 5 MG/ML INJ
INTRAVENOUS | Status: AC
  Filled 2024-02-19: qty 5

## 2024-02-19 MED ORDER — ENSURE PRE-SURGERY PO LIQD: 296.0000 mL | Freq: Once | ORAL | Status: DC

## 2024-02-19 MED ORDER — PROPOFOL 1000 MG/100ML IV EMUL
INTRAVENOUS | Status: AC
  Filled 2024-02-19: qty 500

## 2024-02-19 MED ORDER — ACETAMINOPHEN 10 MG/ML IV SOLN
INTRAVENOUS | Status: AC
  Filled 2024-02-19: qty 100

## 2024-02-19 MED ORDER — LIDOCAINE 2% (20 MG/ML) 5 ML SYRINGE
INTRAMUSCULAR | Status: AC
  Filled 2024-02-19: qty 5

## 2024-02-19 MED ORDER — 0.9 % SODIUM CHLORIDE (POUR BTL) OPTIME
TOPICAL | Status: DC | PRN
  Administered 2024-02-19: 1000 mL

## 2024-02-19 MED ORDER — BUPIVACAINE-EPINEPHRINE (PF) 0.25% -1:200000 IJ SOLN
INTRAMUSCULAR | Status: AC
  Filled 2024-02-19: qty 30

## 2024-02-19 MED ORDER — LACTATED RINGERS IV SOLN: INTRAVENOUS | Status: DC

## 2024-02-19 MED ORDER — CHLORHEXIDINE GLUCONATE 0.12 % MT SOLN
15.0000 mL | Freq: Once | OROMUCOSAL | Status: AC
  Administered 2024-02-19: 15 mL via OROMUCOSAL
  Filled 2024-02-19: qty 15

## 2024-02-19 MED ORDER — PHENYLEPHRINE HCL-NACL 20-0.9 MG/250ML-% IV SOLN
INTRAVENOUS | Status: DC | PRN
  Administered 2024-02-19: 25 ug/min via INTRAVENOUS

## 2024-02-19 NOTE — Op Note (Signed)
 02/19/2024  8:53 AM  9:50 AM  PATIENT:  Dominique Ochoa  84 y.o. female  PRE-OPERATIVE DIAGNOSIS:  UMBILICAL MASS  POST-OPERATIVE DIAGNOSIS:  UMBILICAL MASS 4X5CM  PROCEDURE:  Procedures: EXCISION, NEOPLASM, ABDOMINAL WALL (N/A)  SURGEON:  Surgeons and Role:    DEWAINE Rubin Calamity, MD - Primary  ASSISTANTS: none   ANESTHESIA:   local and general  EBL:  minimal   BLOOD ADMINISTERED:none  DRAINS: none   LOCAL MEDICATIONS USED:  MARCAINE      SPECIMEN:  Source of Specimen: Umbilical mass, 4 x 5  DISPOSITION OF SPECIMEN:  PATHOLOGY  COUNTS:  YES  TOURNIQUET:  * No tourniquets in log *  DICTATION: .Dragon Dictation  Indication procedure: Patient is an 84 year old female, with a umbilical mass that continue to drain.  Patient underwent CT scan and was found to have some fluid beneath the mass area.  Difficult to discern whether or not this was a chronic draining abscess versus possible skin carcinoma.  Patient was counseled and decided to have this electively removed.  Findings: Patient with a 4 x 5 cm umbilical mass.  This was taken down through the fascia at the umbilicus.  There is no infection that was encountered.  Healthy surrounding tissue was taken circumferentially.  A short stitch was marked the inferior edge and a long stitch marks the right edge.  Detail procedure: After the patient was consented patient taken back to the OR and placed supine position with bilateral SCDs placed.  She underwent general endotracheal intubation.  Patient was prepped draped standard fashion.  A timeout was called all facts verified.  At this time a elliptical incision was made over the umbilical mass at the umbilicus.  Dissection was taken down to the fascia using cautery.  At this point I decided to take the dissection through the fascia into the peritoneum.  The mass was submitted to the dense and adherent to the anterior fascia.  I circumferentially dissected this around and  remove this from the fascial layer.  A ring of tolerated tissue was taken circumferentially around the mass.  At this time a 2-0 PDS x 2 was used to reapproximate the fascia in the midline.  3-0 Vicryl was used to reapproximate the deep dermal and subcutaneous layer.  Skin was reapproximated with 4 Monocryl.  The skin was then dressed with Dermabond.  Patient tolerated procedure well was taken to the recovery in stable condition.    PLAN OF CARE: Discharge to home after PACU  PATIENT DISPOSITION:  PACU - hemodynamically stable.   Delay start of Pharmacological VTE agent (>24hrs) due to surgical blood loss or risk of bleeding: not applicable

## 2024-02-19 NOTE — Transfer of Care (Signed)
 Immediate Anesthesia Transfer of Care Note  Patient: Dominique Ochoa  Procedure(s) Performed: EXCISION, NEOPLASM, ABDOMINAL WALL  Patient Location: PACU  Anesthesia Type:General  Level of Consciousness: awake, alert , oriented, and patient cooperative  Airway & Oxygen Therapy: Patient Spontanous Breathing and Patient connected to face mask oxygen  Post-op Assessment: Report given to RN and Post -op Vital signs reviewed and stable  Post vital signs: Reviewed and stable  Last Vitals:  Vitals Value Taken Time  BP 135/80 02/19/24 10:04  Temp    Pulse 82   Resp 18 02/19/24 10:07  SpO2 99   Vitals shown include unfiled device data.  Last Pain:  Vitals:   02/19/24 0727  TempSrc:   PainSc: 0-No pain         Complications: No notable events documented.

## 2024-02-19 NOTE — Anesthesia Postprocedure Evaluation (Signed)
"   Anesthesia Post Note  Patient: Dominique Ochoa  Procedure(s) Performed: EXCISION, NEOPLASM, ABDOMINAL WALL     Patient location during evaluation: PACU Anesthesia Type: General Level of consciousness: awake and alert Pain management: pain level controlled Vital Signs Assessment: post-procedure vital signs reviewed and stable Respiratory status: spontaneous breathing, nonlabored ventilation and respiratory function stable Cardiovascular status: blood pressure returned to baseline and stable Postop Assessment: no apparent nausea or vomiting Anesthetic complications: no   No notable events documented.  Last Vitals:  Vitals:   02/19/24 1045 02/19/24 1100  BP:    Pulse:    Resp:    Temp:    SpO2: 100% 100%    Last Pain:  Vitals:   02/19/24 1035  TempSrc:   PainSc: 0-No pain                 Sabrine Patchen      "

## 2024-02-19 NOTE — Anesthesia Procedure Notes (Signed)
 Procedure Name: Intubation Date/Time: 02/19/2024 9:26 AM  Performed by: Nada Corean CROME, CRNAPatient Re-evaluated:Patient Re-evaluated prior to induction Oxygen Delivery Method: Circle system utilized Preoxygenation: Pre-oxygenation with 100% oxygen Induction Type: IV induction Ventilation: Mask ventilation without difficulty Laryngoscope Size: Mac and 3 Grade View: Grade I Tube size: 7.0 mm

## 2024-02-19 NOTE — Anesthesia Procedure Notes (Signed)
 Procedure Name: LMA Insertion Date/Time: 02/19/2024 9:12 AM  Performed by: Nada Corean CROME, CRNAPatient Re-evaluated:Patient Re-evaluated prior to induction Oxygen Delivery Method: Circle system utilized Preoxygenation: Pre-oxygenation with 100% oxygen Induction Type: IV induction LMA: LMA inserted LMA Size: 3.0

## 2024-02-19 NOTE — Interval H&P Note (Signed)
 History and Physical Interval Note:  02/19/2024 8:22 AM  Dominique Ochoa  has presented today for surgery, with the diagnosis of UMBILICL MASS.  The various methods of treatment have been discussed with the patient and family. After consideration of risks, benefits and other options for treatment, the patient has consented to  Procedures: EXCISION, NEOPLASM, ABDOMINAL WALL (N/A) as a surgical intervention.  The patient's history has been reviewed, patient examined, no change in status, stable for surgery.  I have reviewed the patient's chart and labs.  Questions were answered to the patient's satisfaction.     Honore Wipperfurth

## 2024-02-20 ENCOUNTER — Encounter (HOSPITAL_COMMUNITY): Payer: Self-pay | Admitting: General Surgery

## 2024-03-03 ENCOUNTER — Encounter (HOSPITAL_BASED_OUTPATIENT_CLINIC_OR_DEPARTMENT_OTHER): Payer: Self-pay | Admitting: Emergency Medicine

## 2024-03-03 ENCOUNTER — Inpatient Hospital Stay (HOSPITAL_BASED_OUTPATIENT_CLINIC_OR_DEPARTMENT_OTHER)
Admission: EM | Admit: 2024-03-03 | Source: Home / Self Care | Attending: Internal Medicine | Admitting: Internal Medicine

## 2024-03-03 ENCOUNTER — Other Ambulatory Visit: Payer: Self-pay

## 2024-03-03 ENCOUNTER — Emergency Department (HOSPITAL_BASED_OUTPATIENT_CLINIC_OR_DEPARTMENT_OTHER)

## 2024-03-03 DIAGNOSIS — N182 Chronic kidney disease, stage 2 (mild): Secondary | ICD-10-CM | POA: Diagnosis present

## 2024-03-03 DIAGNOSIS — I10 Essential (primary) hypertension: Secondary | ICD-10-CM | POA: Diagnosis present

## 2024-03-03 DIAGNOSIS — E039 Hypothyroidism, unspecified: Secondary | ICD-10-CM | POA: Diagnosis present

## 2024-03-03 DIAGNOSIS — I509 Heart failure, unspecified: Principal | ICD-10-CM

## 2024-03-03 DIAGNOSIS — E785 Hyperlipidemia, unspecified: Secondary | ICD-10-CM | POA: Diagnosis present

## 2024-03-03 DIAGNOSIS — E876 Hypokalemia: Secondary | ICD-10-CM | POA: Diagnosis present

## 2024-03-03 DIAGNOSIS — R7989 Other specified abnormal findings of blood chemistry: Secondary | ICD-10-CM | POA: Diagnosis present

## 2024-03-03 DIAGNOSIS — J45909 Unspecified asthma, uncomplicated: Secondary | ICD-10-CM | POA: Diagnosis present

## 2024-03-03 DIAGNOSIS — K219 Gastro-esophageal reflux disease without esophagitis: Secondary | ICD-10-CM | POA: Diagnosis present

## 2024-03-03 DIAGNOSIS — I493 Ventricular premature depolarization: Secondary | ICD-10-CM

## 2024-03-03 LAB — BASIC METABOLIC PANEL WITH GFR
Anion gap: 13 (ref 5–15)
BUN: 23 mg/dL (ref 8–23)
CO2: 24 mmol/L (ref 22–32)
Calcium: 9.1 mg/dL (ref 8.9–10.3)
Chloride: 102 mmol/L (ref 98–111)
Creatinine, Ser: 1.03 mg/dL — ABNORMAL HIGH (ref 0.44–1.00)
GFR, Estimated: 53 mL/min — ABNORMAL LOW
Glucose, Bld: 139 mg/dL — ABNORMAL HIGH (ref 70–99)
Potassium: 3 mmol/L — ABNORMAL LOW (ref 3.5–5.1)
Sodium: 138 mmol/L (ref 135–145)

## 2024-03-03 LAB — CBC
HCT: 35.2 % — ABNORMAL LOW (ref 36.0–46.0)
Hemoglobin: 11.5 g/dL — ABNORMAL LOW (ref 12.0–15.0)
MCH: 27.1 pg (ref 26.0–34.0)
MCHC: 32.7 g/dL (ref 30.0–36.0)
MCV: 82.8 fL (ref 80.0–100.0)
Platelets: 176 10*3/uL (ref 150–400)
RBC: 4.25 MIL/uL (ref 3.87–5.11)
RDW: 15.5 % (ref 11.5–15.5)
WBC: 6 10*3/uL (ref 4.0–10.5)
nRBC: 0 % (ref 0.0–0.2)

## 2024-03-03 LAB — MAGNESIUM: Magnesium: 1.7 mg/dL (ref 1.7–2.4)

## 2024-03-03 LAB — TROPONIN T, HIGH SENSITIVITY
Troponin T High Sensitivity: 43 ng/L — ABNORMAL HIGH (ref 0–19)
Troponin T High Sensitivity: 45 ng/L — ABNORMAL HIGH (ref 0–19)

## 2024-03-03 LAB — PRO BRAIN NATRIURETIC PEPTIDE: Pro Brain Natriuretic Peptide: 24087 pg/mL — ABNORMAL HIGH

## 2024-03-03 MED ORDER — ALBUTEROL SULFATE (2.5 MG/3ML) 0.083% IN NEBU: 2.5000 mg | INHALATION_SOLUTION | Freq: Four times a day (QID) | RESPIRATORY_TRACT | Status: AC | PRN

## 2024-03-03 MED ORDER — POTASSIUM CHLORIDE CRYS ER 20 MEQ PO TBCR
40.0000 meq | EXTENDED_RELEASE_TABLET | ORAL | Status: AC
  Administered 2024-03-03 (×2): 40 meq via ORAL
  Filled 2024-03-03 (×2): qty 2

## 2024-03-03 MED ORDER — MAGNESIUM SULFATE 2 GM/50ML IV SOLN
2.0000 g | Freq: Once | INTRAVENOUS | Status: AC
  Administered 2024-03-03: 2 g via INTRAVENOUS
  Filled 2024-03-03: qty 50

## 2024-03-03 MED ORDER — MONTELUKAST SODIUM 10 MG PO TABS
10.0000 mg | ORAL_TABLET | Freq: Every day | ORAL | Status: AC
  Administered 2024-03-04 – 2024-03-05 (×2): 10 mg via ORAL
  Filled 2024-03-03 (×2): qty 1

## 2024-03-03 MED ORDER — LEVOTHYROXINE SODIUM 25 MCG PO TABS
25.0000 ug | ORAL_TABLET | Freq: Every day | ORAL | Status: AC
  Administered 2024-03-04 – 2024-03-05 (×2): 25 ug via ORAL
  Filled 2024-03-03 (×2): qty 1

## 2024-03-03 MED ORDER — ACETAMINOPHEN 650 MG RE SUPP: 650.0000 mg | Freq: Four times a day (QID) | RECTAL | Status: AC | PRN

## 2024-03-03 MED ORDER — POLYETHYLENE GLYCOL 3350 17 G PO PACK: 17.0000 g | PACK | Freq: Every day | ORAL | Status: AC | PRN

## 2024-03-03 MED ORDER — FUROSEMIDE 10 MG/ML IJ SOLN
40.0000 mg | Freq: Two times a day (BID) | INTRAMUSCULAR | Status: AC
  Administered 2024-03-04 – 2024-03-05 (×4): 40 mg via INTRAVENOUS
  Filled 2024-03-03 (×4): qty 4

## 2024-03-03 MED ORDER — ENOXAPARIN SODIUM 40 MG/0.4ML IJ SOSY
40.0000 mg | PREFILLED_SYRINGE | INTRAMUSCULAR | Status: AC
  Administered 2024-03-04 – 2024-03-05 (×2): 40 mg via SUBCUTANEOUS
  Filled 2024-03-03 (×3): qty 0.4

## 2024-03-03 MED ORDER — FUROSEMIDE 10 MG/ML IJ SOLN
60.0000 mg | Freq: Once | INTRAMUSCULAR | Status: AC
  Administered 2024-03-03: 60 mg via INTRAVENOUS
  Filled 2024-03-03: qty 6

## 2024-03-03 MED ORDER — ACETAMINOPHEN 325 MG PO TABS: 650.0000 mg | ORAL_TABLET | Freq: Four times a day (QID) | ORAL | Status: AC | PRN

## 2024-03-03 MED ORDER — ONDANSETRON HCL 4 MG/2ML IJ SOLN: 4.0000 mg | Freq: Four times a day (QID) | INTRAMUSCULAR | Status: AC | PRN

## 2024-03-03 MED ORDER — TRAMADOL HCL 50 MG PO TABS: 50.0000 mg | ORAL_TABLET | Freq: Four times a day (QID) | ORAL | Status: AC | PRN

## 2024-03-03 MED ORDER — ONDANSETRON HCL 4 MG PO TABS: 4.0000 mg | ORAL_TABLET | Freq: Four times a day (QID) | ORAL | Status: AC | PRN

## 2024-03-03 NOTE — Assessment & Plan Note (Signed)
 Med history pending, no PPI on current list.

## 2024-03-03 NOTE — Assessment & Plan Note (Signed)
 Appears not on statin, med history pending

## 2024-03-03 NOTE — Assessment & Plan Note (Signed)
 BP mildly elevated 153/64 >> 140/91 in the ED. --On IV Lasix  as above --Resume home regimen pending med history & as BP warrants

## 2024-03-03 NOTE — ED Notes (Signed)
 Attempted x 2 to get an IV. Unable to get access.

## 2024-03-03 NOTE — Assessment & Plan Note (Signed)
 Cr 1.03 on admission, stable from 0.96 on 02/16/24.   --Monitor renal function closely with diuresis

## 2024-03-03 NOTE — Assessment & Plan Note (Signed)
 Mild troponin elevation 43>>45. No chest pain or acute ischemic ECG changes.  Consistent with mild demand ischemia. --Obtain EKG and troponin if chest pain --Echo pending for CHF evaluation

## 2024-03-03 NOTE — H&P (Signed)
 "  Telemedicine History and Physical    Patient: Dominique Ochoa FMW:[id number] DOB: 07-14-1940 DOA: 03/03/2024 DOS: the patient was seen and examined on 03/03/2024 PCP: Minta Jon BROCKS, NP  Patient coming from: Home  Referring Provider: Juliene Bicker, DO Telemedicine Provider: Burnard Cunning, DO Provider  Location: [place], [place] Patient Location: [facility] Referring Diagnosis: New onset CHF Patient Name and DOB verified: Dominique Ochoa, [birth year] Patient consented to Telemedicine Evaluation: Yes RN virtual assistant: Darryle Lunger, RN Video encounter time and date: 03/03/2024 6:50 PM   Chief Complaint:  Chief Complaint  Patient presents with   Shortness of Breath   HPI: Dominique Ochoa is a 84 y.o. female with medical history significant of hypertension, hyperlipidemia, asthma, OSA, GERD, PSVT, hypothyroidism, CKD stage II who presented to [facility] ED for evaluation of progressively worsening shortness of breath and bilateral lower extremity swelling.  She reports gradual worsening of symptoms over about the past month.  She denies any episodes of chest pain.  She reports initially feeling like asthma was causing dyspnea but got worse and more severe.  This AM, pt states she could not make it ambulating the short distance to her bathroom she was so short-winded.  She did use albuterol  neb treatment, it helped a little.  She states that, at baseline, her leg swelling goes down at night and comes back during the day.  Swelling has been more persistent and worse than usual.  She denies cough or sore throat, but has had some congestion.  No feer/chills.  Reports baseline poor appetite and feeling full quickly.  No abdominal pain other than indigestion last night, no nausea/vomiting.  She reports high urine output from Lasix  given in the ED.  No Uti symptoms.  ED course -  Initial vitals -temp 98.1 F, HR 74, RR 20, BP 123/64, SpO2 99% on room air. Labs obtained including BMP and CBC were  notable for potassium 3.0, nonfasting glucose 139, creatinine 1.03 (stable from 0.96 in January), hemoglobin stable 11.5. proBNP elevated 24087.0. Troponin minimally elevated 43. Imaging- - 2 view chest x-ray shows stable cardiomegaly and mild bibasilar atelectasis with small bilateral pleural effusions  Patient was treated in the ED with 60 mg IV Lasix  and is being admitted to the hospital for further evaluation and management of new onset CHF as outlined in detail below.   Review of Systems: As mentioned in the history of present illness. All other systems reviewed and are negative.   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   Acute cholecystitis 04/03/2018   Adductovarus rotation of toe, acquired, left 10/10/2021   AKI (acute kidney injury) 09/25/2020   Asthma 09/16/2015   B12 deficiency 02/03/2018   Bowel obstruction (HCC) 04/16/2019   Cardiac murmur 03/13/2022   CKD (chronic kidney disease), 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   Corn of toe 10/10/2021   Does use hearing Ochoa    Elevated troponin 04/03/2018   Essential hypertension 09/16/2015   Gallstone 04/03/2018   GERD (gastroesophageal reflux disease)    History of robot-assisted repair of inguinal hernia 09/25/2020   Formatting of this note might be different from the original. 8/22 bilateral   Hyperglycemia 09/16/2015   Hyperlipidemia 11/16/2019   Hypertension    Hypothyroidism 09/16/2015   Injury of right toe, sequela 04/20/2017   Mild intermittent asthma without complication 11/16/2019   Near syncope 04/20/2017   Normocytic anemia 04/20/2017   OSA (obstructive  sleep apnea) 01/24/2017   Formatting of this note might be different from the original. Sx cleared after wt loss, no CPAP   Pain in the chest 09/16/2015   PSVT (paroxysmal supraventricular tachycardia) 05/22/2016   Sepsis (HCC) 04/03/2018   Thyroid  disease    Ventral hernia with bowel  obstruction 04/16/2019   Vitamin D  deficiency disease 11/16/2019   Wears dentures    full   Past Surgical History:  Procedure Laterality Date   CHOLECYSTECTOMY N/A 04/04/2018   Procedure: LAPAROSCOPIC CHOLECYSTECTOMY WITH INTRAOPERATIVE CHOLANGIOGRAM;  Surgeon: Ethyl Lenis, MD;  Location: [facility] ORS;  Service: General;  Laterality: N/A;   EXCISION OF ABDOMINAL WALL TUMOR N/A 02/19/2024   Procedure: EXCISION, NEOPLASM, ABDOMINAL WALL;  Surgeon: Rubin Calamity, MD;  Location: [facility] OR;  Service: General;  Laterality: N/A;   EYE SURGERY Bilateral    cataracts removed   INSERTION OF MESH Bilateral 09/11/2020   Procedure: INSERTION OF MESH;  Surgeon: Rubin Calamity, MD;  Location: [facility] OR;  Service: General;  Laterality: Bilateral;   REPAIR, HERNIA, INGUINAL, BILATERAL, ROBOT-ASSISTED Bilateral 09/11/2020   Procedure: XI ROBOTIC ASSISTED BILATERAL INGUINAL HERNIA WITH MESH;  Surgeon: Rubin Calamity, MD;  Location: [facility] OR;  Service: General;  Laterality: Bilateral;   Social History:  reports that she has never smoked. She has never been exposed to tobacco smoke. She has never used smokeless tobacco. She reports that she does not drink alcohol and does not use drugs.  Allergies[1]  Family History  Problem Relation Age of Onset   Hypertension Son     Prior to Admission medications  Medication Sig Start Date End Date Taking? Authorizing Provider  albuterol  (PROVENTIL  HFA;VENTOLIN  HFA) 108 (90 Base) MCG/ACT inhaler Inhale 1 puff into the lungs every 6 (six) hours as needed for wheezing or shortness of breath.    [provider]  albuterol  (PROVENTIL ) (2.5 MG/3ML) 0.083% nebulizer solution Take 2.5 mg by nebulization every 6 (six) hours as needed for wheezing or shortness of breath. 01/28/19   [provider]  amLODipine  (NORVASC ) 5 MG tablet Take 5 mg by mouth in the morning.    [provider]  cyanocobalamin (,VITAMIN B-12,) 1000 MCG/ML injection Inject 1,000 mcg into  the muscle every 30 (thirty) days.    [provider]  levothyroxine  (SYNTHROID , LEVOTHROID) 25 MCG tablet Take 25 mcg by mouth daily before breakfast.    [provider]  montelukast  (SINGULAIR ) 10 MG tablet Take 10 mg by mouth at bedtime. 03/26/19   [provider]  Multiple Vitamin (MULTIVITAMIN WITH MINERALS) TABS tablet Take 1 tablet by mouth daily.    [provider]  traMADol  (ULTRAM ) 50 MG tablet Take 1 tablet (50 mg total) by mouth every 6 (six) hours as needed. 02/19/24 02/18/25  Rubin Calamity, MD  VITAMIN D  PO Take 1 tablet by mouth daily.    [provider]    Physical Exam: Vitals:   03/03/24 1534 03/03/24 1537 03/03/24 1830  BP:  (!) 153/64 (!) 140/91  Pulse:  74 86  Resp:  20 (!) 29  Temp:  98.1 F (36.7 C)   SpO2:  99% 96%  Weight: 58 kg     Bedside physical exam was performed by RN listed above. Below exam findings are based on their in person physical exam findings and my observations during virtual encounter.  General exam: awake, alert, no acute distress HEENT: voice clear, mildly hard of hearing  Respiratory system: CTAB with diminished bases bilaterally, no wheezes or rhonchi,  normal respiratory effort. On room air Cardiovascular system: normal S1/S2, RRR, 1+ BLE pitting edema.   Gastrointestinal system: soft, NT, ND,  +bowel sounds. Central nervous system: A&O x 3. no gross focal neurologic deficits, normal speech Skin: dry, intact, normal temperature per RN Psychiatry: normal mood, congruent affect, judgement and insight appear normal   Data Reviewed:  As reviewed in detail above  Assessment and Plan:  * Acute CHF (HCC) Valvular heart disease Prior echo in Feb 2024 showed normal EF 60-65%, indeterminate diastolic parameters, mild-mod MR, mod TR, moderately elevated PASP. Now presenting with progressive dyspnea and bilateral LE pitting edema.  proBNP > 24k. Consistent with decompensated heart  failure. --Admit to telemetry --Echocardiogram --Diuresis: Lasix  40 mg IV BID --Strict Io's & daily weights --Monitor renal function & electrolytes   Hypokalemia K 3.0 in the ED.  Lasix  60 mg IV is ordered, pending administration at this time. --Replace with 40 mEq x 2 doses orally --Monitor & replace K and Mg PRN --Add on Mg level  Elevated troponin Mild troponin elevation 43>>45. No chest pain or acute ischemic ECG changes.  Consistent with mild demand ischemia. --Obtain EKG and troponin if chest pain --Echo pending for CHF evaluation   CKD (chronic kidney disease), stage II Cr 1.03 on admission, stable from 0.96 on 02/16/24.   --Monitor renal function closely with diuresis  Essential hypertension BP mildly elevated 153/64 >> 140/91 in the ED. --On IV Lasix  as above --Resume home regimen pending med history & as BP warrants  Hyperlipidemia Appears not on statin, med history pending  GERD (gastroesophageal reflux disease) Med history pending, no PPI on current list.  Asthma Stable without wheezing.  Dyspnea seems due to fluid overload /CHF. --Albuterol  nebs PRN --Resume Singulair       Advance Care Planning: Code status - full code Pt states she would want all efforts to be resuscitated in the event of cardiac or respiratory arrest  Consults: None at this time  Family Communication: None present during virtual admission encounter.  Severity of Illness: The appropriate patient status for this patient is INPATIENT. Inpatient status is judged to be reasonable and necessary in order to provide the required intensity of service to ensure the patient's safety. The patient's presenting symptoms, physical exam findings, and initial radiographic and laboratory data in the context of their chronic comorbidities is felt to place them at high risk for further clinical deterioration. Furthermore, it is not anticipated that the patient will be medically stable for discharge from  the hospital within 2 midnights of admission.   * I certify that at the point of admission it is my clinical judgment that the patient will require inpatient hospital care spanning beyond 2 midnights from the point of admission due to high intensity of service, high risk for further deterioration and high frequency of surveillance required.*  Author: Burnard DELENA Cunning, DO 03/03/2024 7:40 PM  For on call review [email or web address].      [1]  Allergies Allergen Reactions   Fish Allergy Other (See Comments)    Makes her weak   Fish Oil Other (See Comments) and Swelling    MD orders   Lisinopril  Swelling    Facial swelling   Sulfamethoxazole-Trimethoprim Itching and Swelling    Facial swelling   "

## 2024-03-03 NOTE — ED Triage Notes (Addendum)
 Reports shortness of breath x 1 month getting progressively worse . Denies chest pain  Hx asthma . Denies cough yet some nasal  congestion she said  Alert and oriented x 4   Adds bilateral leg swelling

## 2024-03-03 NOTE — Assessment & Plan Note (Signed)
 Valvular heart disease Prior echo in Feb 2024 showed normal EF 60-65%, indeterminate diastolic parameters, mild-mod MR, mod TR, moderately elevated PASP. Now presenting with progressive dyspnea and bilateral LE pitting edema.  proBNP > 24k. Consistent with decompensated heart failure. --Admit to telemetry --Echocardiogram --Diuresis: Lasix  40 mg IV BID --Strict Io's & daily weights --Monitor renal function & electrolytes

## 2024-03-03 NOTE — Assessment & Plan Note (Addendum)
 K 3.0 in the ED.  Lasix  60 mg IV is ordered, pending administration at this time. --Replace with 40 mEq x 2 doses orally --Monitor & replace K and Mg PRN --Add on Mg level

## 2024-03-03 NOTE — Assessment & Plan Note (Addendum)
 Stable without wheezing.  Dyspnea seems due to fluid overload /CHF. --Albuterol  nebs PRN --Resume Singulair 

## 2024-03-03 NOTE — ED Provider Notes (Signed)
 " [place] [facility] Provider Note   CSN: [id number] Arrival date & time: 03/03/24  1524     Patient presents with: Shortness of Breath   Dominique Ochoa is a 84 y.o. female.   Patient here with shortness of breath leg swelling for the last several days.  Nothing makes it worse or better.  Sounds like maybe valvular heart disease but no history of heart failure.  Denies any chest pain weakness headache fever chills.  Nothing makes it worse or better.  She cannot walk very far without getting very short of breath.  The history is provided by the patient.       Prior to Admission medications  Medication Sig Start Date End Date Taking? Authorizing Provider  albuterol  (PROVENTIL  HFA;VENTOLIN  HFA) 108 (90 Base) MCG/ACT inhaler Inhale 1 puff into the lungs every 6 (six) hours as needed for wheezing or shortness of breath.    [provider]  albuterol  (PROVENTIL ) (2.5 MG/3ML) 0.083% nebulizer solution Take 2.5 mg by nebulization every 6 (six) hours as needed for wheezing or shortness of breath. 01/28/19   [provider]  amLODipine  (NORVASC ) 5 MG tablet Take 5 mg by mouth in the morning.    [provider]  cyanocobalamin (,VITAMIN B-12,) 1000 MCG/ML injection Inject 1,000 mcg into the muscle every 30 (thirty) days.    [provider]  levothyroxine  (SYNTHROID , LEVOTHROID) 25 MCG tablet Take 25 mcg by mouth daily before breakfast.    [provider]  montelukast  (SINGULAIR ) 10 MG tablet Take 10 mg by mouth at bedtime. 03/26/19   [provider]  Multiple Vitamin (MULTIVITAMIN WITH MINERALS) TABS tablet Take 1 tablet by mouth daily.    [provider]  traMADol  (ULTRAM ) 50 MG tablet Take 1 tablet (50 mg total) by mouth every 6 (six) hours as needed. 02/19/24 02/18/25  Rubin Calamity, MD  VITAMIN D  PO Take 1 tablet by mouth daily.    [provider]    Allergies: Fish allergy,  Fish oil, Lisinopril , and Sulfamethoxazole-trimethoprim    Review of Systems  Updated Vital Signs BP (!) 153/64 (BP Location: Left Arm)   Pulse 74   Temp 98.1 F (36.7 C)   Resp 20   Wt 58 kg   SpO2 99%   BMI 21.28 kg/m   Physical Exam Vitals and nursing note reviewed.  Constitutional:      General: She is not in acute distress.    Appearance: She is well-developed.  HENT:     Head: Normocephalic and atraumatic.     Mouth/Throat:     Mouth: Mucous membranes are moist.  Eyes:     Conjunctiva/sclera: Conjunctivae normal.     Pupils: Pupils are equal, round, and reactive to light.  Cardiovascular:     Rate and Rhythm: Normal rate and regular rhythm.     Heart sounds: No murmur heard. Pulmonary:     Effort: Pulmonary effort is normal. No respiratory distress.     Breath sounds: Rales present.  Abdominal:     Palpations: Abdomen is soft.     Tenderness: There is no abdominal tenderness.  Musculoskeletal:        General: No swelling.     Cervical back: Normal range of motion and neck supple.     Right lower leg: Edema present.     Left lower leg: Edema present.  Skin:    General: Skin is warm and dry.     Capillary  Refill: Capillary refill takes less than 2 seconds.  Neurological:     Mental Status: She is alert.  Psychiatric:        Mood and Affect: Mood normal.     (all labs ordered are listed, but only abnormal results are displayed) Labs Reviewed  BASIC METABOLIC PANEL WITH GFR - Abnormal; Notable for the following components:      Result Value   Potassium 3.0 (*)    Glucose, Bld 139 (*)    Creatinine, Ser 1.03 (*)    GFR, Estimated 53 (*)    All other components within normal limits  CBC - Abnormal; Notable for the following components:   Hemoglobin 11.5 (*)    HCT 35.2 (*)    All other components within normal limits  PRO BRAIN NATRIURETIC PEPTIDE - Abnormal; Notable for the following components:   Pro Brain Natriuretic Peptide 24,087.0 (*)    All  other components within normal limits  TROPONIN T, HIGH SENSITIVITY - Abnormal; Notable for the following components:   Troponin T High Sensitivity 43 (*)    All other components within normal limits  TROPONIN T, HIGH SENSITIVITY    EKG: EKG Interpretation Date/Time:  Wednesday March 03 2024 15:52:29 EST Ventricular Rate:  91 PR Interval:  54 QRS Duration:  142 QT Interval:  378 QTC Calculation: 466 R Axis:   -66  Text Interpretation: Sinus tachycardia Paired ventricular premature complexes Confirmed by Ruthe Cornet [phone]) on 03/03/2024 5:44:02 PM  Radiology: [facility] Chest 2 View Result Date: 03/03/2024 CLINICAL DATA:  Progressively worsening shortness of breath x1 month. EXAM: CHEST - 2 VIEW COMPARISON:  January 09, 2023 FINDINGS: There is stable cardiac silhouette enlargement with a prominent left atrial appendage. There is marked severity calcification of the aortic arch. Mild atelectatic changes are noted within the bilateral lung bases with small bilateral pleural effusions. No pneumothorax is identified. Bilateral nipple shadows are present. Multilevel degenerative changes are present throughout the thoracic spine. IMPRESSION: Stable cardiomegaly with mild bibasilar atelectasis with small bilateral pleural effusions. Electronically Signed   By: Suzen Dials M.D.   On: 03/03/2024 16:34     Procedures   Medications Ordered in the ED  furosemide  (LASIX ) injection 60 mg (has no administration in time range)                                    Medical Decision Making Amount and/or Complexity of Data Reviewed Labs: ordered. Radiology: ordered.  Risk Prescription drug management. Decision regarding hospitalization.   Dominique Ochoa is here with shortness of breath and leg swelling.  She looks clinically volume overloaded on exam.  She has history of CKD/sounds like she has a history of valvular heart disease but no specific heart failure.  Lab work was obtained  including troponin BNP chest x-ray.  Vitals are overall unremarkable.  Differential diagnosis includes heart failure volume overload worsening renal failure  Lab work shows proBNP of 24,000.  Troponins 43.  But otherwise labs are unremarkable.  Creatinine 1.  Chest x-ray shows signs of volume overload as well.  Will admit her for heart failure concern.  Have no concern for PE.  No risk factors.  Will give IV Lasix  and admit to medicine for further workup for heart failure.  This chart was dictated using voice recognition software.  Despite best efforts to proofread,  errors can occur which can change the documentation meaning.  Final diagnoses:  Acute on chronic congestive heart failure, unspecified heart failure type [facility])    ED Discharge Orders     None          Ruthe Cornet, DO 03/03/24 1752  "

## 2024-03-04 LAB — BASIC METABOLIC PANEL WITH GFR
Anion gap: 10 (ref 5–15)
BUN: 20 mg/dL (ref 8–23)
CO2: 27 mmol/L (ref 22–32)
Calcium: 9.2 mg/dL (ref 8.9–10.3)
Chloride: 104 mmol/L (ref 98–111)
Creatinine, Ser: 0.99 mg/dL (ref 0.44–1.00)
GFR, Estimated: 55 mL/min — ABNORMAL LOW
Glucose, Bld: 102 mg/dL — ABNORMAL HIGH (ref 70–99)
Potassium: 4 mmol/L (ref 3.5–5.1)
Sodium: 141 mmol/L (ref 135–145)

## 2024-03-04 LAB — CBC
HCT: 32.5 % — ABNORMAL LOW (ref 36.0–46.0)
Hemoglobin: 10.8 g/dL — ABNORMAL LOW (ref 12.0–15.0)
MCH: 27.5 pg (ref 26.0–34.0)
MCHC: 33.2 g/dL (ref 30.0–36.0)
MCV: 82.7 fL (ref 80.0–100.0)
Platelets: 153 10*3/uL (ref 150–400)
RBC: 3.93 MIL/uL (ref 3.87–5.11)
RDW: 15.4 % (ref 11.5–15.5)
WBC: 5.3 10*3/uL (ref 4.0–10.5)
nRBC: 0 % (ref 0.0–0.2)

## 2024-03-04 LAB — TROPONIN T, HIGH SENSITIVITY: Troponin T High Sensitivity: 40 ng/L — ABNORMAL HIGH (ref 0–19)

## 2024-03-04 MED ORDER — AMLODIPINE BESYLATE 5 MG PO TABS
5.0000 mg | ORAL_TABLET | Freq: Every morning | ORAL | Status: DC
  Administered 2024-03-04 – 2024-03-05 (×2): 5 mg via ORAL
  Filled 2024-03-04 (×2): qty 1

## 2024-03-04 NOTE — Initial Assessments (Signed)
"        ° ° °  Medical Center Admission Hold > 4hrs Review  Patient Name: Dominique Ochoa  DOB:12-May-1940 Date of Admission: 03/03/2024  Date of Assessment:03/04/24   -------------------------------------------------------------------------------------------------------------------  Principal Problem (Triage RN note and ED Provider note) AND HPI: CHF  Vitals Review Hemodynamically stable? Yes  Lab Review New critical results or trends. ? Yes: additional troponin for am  Medication Review Appropriate medications are ordered for the patient's primary problem. ? Yes  Plan of Care:  Follow up elctrolytes, diurese and echo  Provider Communication? Yes If yes, provider name Dr Ruthe, Adam   Bed Request and Level of Care Review (Inpatient or Observation) Discussed Disposition With Provider : No Can LOC be downgraded: No   Final Disposition Select one: [ ]  Discharge Home [ ]  ED-to-ED Transfer [ ]  Downgraded LOC [ ]  Awaiting Inpatient/Observation Bed  Additional Notes:  Still  awaiting bed and latest blood work, asked DR Ruthe about a repeat troponin is needed?  -------------------------------------------------------------------------------------------------------------------------  [place] Patient Care Command RN : Flynt Breeze, Hadassah Lat Please contact us  directly via secure chat (search for [facility]) or by calling us  at [phone] [facility]). "

## 2024-03-04 NOTE — Plan of Care (Signed)
 Patient was virtually admitted from [facility] ED on 2/4.  She was assessed at the bedside by provider on 2/5.  In summary, patient is a 84 year old female with PMH HFpEF, hypertension, hyperlipidemia, asthma, OSA, PSVT, GERD, CKD stage II who presented with 1 month of progressively worsening dyspnea and BLE edema. She was admitted for CHF exacerbation with laboratory, clinical and radiological evidence of fluid overloaded status.  At the bedside, patient reports feeling much better with significant improvement in her shortness of breath. She has had 2.5 L  of urine output since admission. Continues to have lower extremity swelling but this is also improving. She is comfortable appearing on room air has no other complaints.  Review of labs shows hypokalemia has resolved (K+ 3.0->4.0) , troponin has peaked at 45, renal function and anemia remains stable.  She remains on Lasix  40 mg twice daily. Repeat echocardiogram still pending.  Refer to H&P on 2/4 for further details of assessment and plan.   Lou Claretta HERO, MD 03/04/2024, 10:45 PM [facility] [place] 41:10

## 2024-03-04 NOTE — Progress Notes (Signed)
"        ° ° °  Medical Center Admission Hold > 4hrs Review  Patient Name: Dominique Ochoa  DOB:May 07, 1940 Date of Admission: 03/03/2024  Date of Assessment:03/04/24   -------------------------------------------------------------------------------------------------------------------  Principal Problem (Triage RN note and ED Provider note) AND HPI: CHF  Vitals Review Hemodynamically stable? Yes  Lab Review New critical results or trends. ? No Troponins flat, K+ improved after replacement Medication Review Appropriate medications are ordered for the patient's primary problem. ? Yes  Plan of Care: --Echocardiogram --Diuresis: Lasix  40 mg IV BID --Strict Io's & daily weights --Monitor renal function & electrolytes --Monitor & replace K and Mg PRN --Resume home regimen pending med history & as BP warrants --Albuterol  nebs PRN --Resume Singulair   Provider Communication? No If yes, provider name N/A   Bed Request and Level of Care Review (Inpatient or Observation) Discussed Disposition With Provider : No Can LOC be downgraded: No   Final Disposition Select one: [ ]  Discharge Home [ ]  ED-to-ED Transfer [ ]  Downgraded LOC [X]  Awaiting Inpatient/Observation Bed  Additional Notes:  Virtual admission completed.  No beds available; awaiting discharges.  -------------------------------------------------------------------------------------------------------------------------  [place] Patient Care Command RN : Lorita Barnie Pereyra Please contact us  directly via secure chat (search for [facility]) or by calling us  at [phone] [facility]). "

## 2024-03-04 NOTE — ED Notes (Signed)
 Pts bed ready .[facility] notified

## 2024-03-05 ENCOUNTER — Other Ambulatory Visit (HOSPITAL_COMMUNITY): Payer: Self-pay

## 2024-03-05 ENCOUNTER — Telehealth (HOSPITAL_COMMUNITY): Payer: Self-pay

## 2024-03-05 ENCOUNTER — Inpatient Hospital Stay (HOSPITAL_COMMUNITY)

## 2024-03-05 DIAGNOSIS — I493 Ventricular premature depolarization: Secondary | ICD-10-CM

## 2024-03-05 LAB — ECHOCARDIOGRAM COMPLETE
Calc EF: 44.7 %
MV M vel: 4.76 m/s
MV Peak grad: 90.6 mmHg
P 1/2 time: 644 ms
Radius: 0.4 cm
S' Lateral: 4.4 cm
Single Plane A2C EF: 48.4 %
Single Plane A4C EF: 40.5 %
Weight: 1848.34 [oz_av]

## 2024-03-05 LAB — COMPREHENSIVE METABOLIC PANEL WITH GFR
ALT: 8 U/L (ref 0–44)
AST: 23 U/L (ref 15–41)
Albumin: 3.5 g/dL (ref 3.5–5.0)
Alkaline Phosphatase: 61 U/L (ref 38–126)
Anion gap: 12 (ref 5–15)
BUN: 21 mg/dL (ref 8–23)
CO2: 26 mmol/L (ref 22–32)
Calcium: 9.1 mg/dL (ref 8.9–10.3)
Chloride: 100 mmol/L (ref 98–111)
Creatinine, Ser: 1.13 mg/dL — ABNORMAL HIGH (ref 0.44–1.00)
GFR, Estimated: 47 mL/min — ABNORMAL LOW
Glucose, Bld: 111 mg/dL — ABNORMAL HIGH (ref 70–99)
Potassium: 3.8 mmol/L (ref 3.5–5.1)
Sodium: 138 mmol/L (ref 135–145)
Total Bilirubin: 1.1 mg/dL (ref 0.0–1.2)
Total Protein: 6.8 g/dL (ref 6.5–8.1)

## 2024-03-05 LAB — CBC
HCT: 36.2 % (ref 36.0–46.0)
Hemoglobin: 12 g/dL (ref 12.0–15.0)
MCH: 27.6 pg (ref 26.0–34.0)
MCHC: 33.1 g/dL (ref 30.0–36.0)
MCV: 83.2 fL (ref 80.0–100.0)
Platelets: 173 10*3/uL (ref 150–400)
RBC: 4.35 MIL/uL (ref 3.87–5.11)
RDW: 15.4 % (ref 11.5–15.5)
WBC: 5 10*3/uL (ref 4.0–10.5)
nRBC: 0 % (ref 0.0–0.2)

## 2024-03-05 LAB — SURGICAL PATHOLOGY

## 2024-03-05 LAB — MAGNESIUM: Magnesium: 1.7 mg/dL (ref 1.7–2.4)

## 2024-03-05 MED ORDER — MAGNESIUM SULFATE 2 GM/50ML IV SOLN
2.0000 g | Freq: Once | INTRAVENOUS | Status: AC
  Administered 2024-03-05: 2 g via INTRAVENOUS
  Filled 2024-03-05: qty 50

## 2024-03-05 MED ORDER — DAPAGLIFLOZIN PROPANEDIOL 10 MG PO TABS
10.0000 mg | ORAL_TABLET | Freq: Every day | ORAL | Status: AC
  Administered 2024-03-05: 10 mg via ORAL
  Filled 2024-03-05: qty 1

## 2024-03-05 MED ORDER — SPIRONOLACTONE 12.5 MG HALF TABLET: 12.5000 mg | ORAL_TABLET | Freq: Every day | ORAL | Status: AC

## 2024-03-05 MED ORDER — METOPROLOL TARTRATE 25 MG PO TABS
25.0000 mg | ORAL_TABLET | Freq: Two times a day (BID) | ORAL | Status: AC
  Administered 2024-03-05: 25 mg via ORAL
  Filled 2024-03-05: qty 1

## 2024-03-05 MED ORDER — PANTOPRAZOLE SODIUM 40 MG PO TBEC: 40.0000 mg | DELAYED_RELEASE_TABLET | Freq: Every day | ORAL | Status: AC

## 2024-03-05 MED ORDER — VITAMIN D 25 MCG (1000 UNIT) PO TABS
1000.0000 [IU] | ORAL_TABLET | Freq: Every day | ORAL | Status: AC
  Administered 2024-03-05: 1000 [IU] via ORAL
  Filled 2024-03-05: qty 1

## 2024-03-05 NOTE — Progress Notes (Signed)
 " PROGRESS NOTE    Dominique Ochoa  FMW:[id number] DOB: [birth year] DOA: 03/03/2024 PCP: Minta Jon BROCKS, NP    Chief Complaint  Patient presents with   Shortness of Breath    Brief Narrative:  Patient pleasant 84 year old female history of hypertension, hyperlipidemia, asthma, OSA, GERD, PSVT, hypothyroidism, CKD stage II presented to ED due to progressive worsening shortness of breath, bilateral lower extremity edema worsening over the past month.  Patient admitted for acute CHF exacerbation.  Cardiology consulted.   Assessment & Plan:   Principal Problem:   Acute CHF (HCC) Active Problems:   Hypokalemia   Essential hypertension   CKD (chronic kidney disease), stage II   Elevated troponin   Asthma   GERD (gastroesophageal reflux disease)   Hyperlipidemia   Hypothyroidism  #1 new onset acute systolic CHF exacerbation - Patient presenting with 1 month history of worsening progressive shortness of breath, worsening bilateral lower extremity edema. - Prior 2D echo from February 2024 with EF of 60 to 65%, indeterminate diastolic parameters, mild to moderate MR, moderate TR, moderately elevated PASP. - proBNP noted on admission elevated at 24,087. - Troponins elevated but flat and at 43-40 5-40. - 2D echo done with a EF of 45 to 50%, left ventricular global hypokinesis, mild LVH, left ventricular diastolic parameters indeterminate.  Mildly reduced right ventricular systolic function.  Moderately elevated PASP.  Severely dilated left atrial size.  Small pericardial effusion present.  Mild to moderate MVR.  No evidence of mitral stenosis.  Moderate TVR.  Mild AVR. - Patient placed on IV Lasix  with urine output of 500 cc recorded over the past 24 hours since admission. - Patient with some clinical improvement. - Patient seen in consultation by cardiology and patient started on spironolactone  12.5 mg daily, Farxiga  10 mg daily, Lopressor . - Cardiology recommending continuation of Lasix   40 mg IV twice daily. -Norvasc  discontinued per cardiology. - Appreciate cardiology input and recommendations.  2.  Hypertension -BP stable. - Continue IV Lasix . - Norvasc  discontinued per cardiology and patient started on spironolactone  12.5 mg daily. - Lopressor  to be started.  3.  Hypothyroidism -Continue Synthroid .  4.  Hypokalemia -Repleted. - Monitor with diuresis. - Patient started on spironolactone  per cardiology.  5.  Elevated troponin -Patient mild elevated troponin which are flattened. - No ischemic changes noted on EKG. - Likely secondary to demand ischemia secondary to problem #1. - Repeat 2D echo done with a EF of 45 to 50%, left ventricular global hypokinesis, mild LVH, left ventricular diastolic parameters indeterminate.  Mildly reduced right ventricular systolic function.  Moderately elevated PASP.  Severely dilated left atrial size.  Small pericardial effusion present.  Mild to moderate MVR.  No evidence of mitral stenosis.  Moderate TVR.  Mild AVR. - Cardiology consulted and following.  4. CKD stage II -Stable. - Monitor with diuresis.  5.  Hyperlipidemia - Patient not on a statin. - Check a fasting lipid panel.  6.  GERD -PPI.  7.  Asthma -Stable. - Continue Singulair . - Albuterol  nebs as needed.  8.  PVCs -Keep potassium approximately 4, magnesium  approximately 2. - Patient started on Lopressor  per cardiology.   DVT prophylaxis: Lovenox  Code Status: Full Family Communication: Updated 3 sons at bedside as well as patient. Disposition: Likely home when clinically improved and cleared by cardiology.  Status is: Inpatient Remains inpatient appropriate because: Severity of illness.   Consultants:  Cardiology  Procedures 2D echo 03/05/2024 Chest x-ray 03/03/2024   Antimicrobials:  Anti-infectives (From admission,  onward)    None         Subjective: Patient laying in bed.  Patient still with shortness of breath however states improved  significantly since admission.  Patient states has significant urine output.  Still with lower extremity edema.  Denies any chest pain.  No abdominal pain.  Sons at bedside.  Objective: Vitals:   03/05/24 0420 03/05/24 0421 03/05/24 0715 03/05/24 1150  BP: 114/70  113/74 120/81  Pulse: 83  78 88  Resp: (!) 22  20 (!) 22  Temp: 97.9 F (36.6 C)  97.8 F (36.6 C) 97.6 F (36.4 C)  TempSrc: Oral  Oral Oral  SpO2: 99%  98% 100%  Weight:  52.4 kg      Intake/Output Summary (Last 24 hours) at 03/05/2024 1210 Last data filed at 03/05/2024 0900 Gross per 24 hour  Intake 220 ml  Output 1100 ml  Net -880 ml   Filed Weights   03/03/24 1534 03/05/24 0421  Weight: 58 kg 52.4 kg    Examination:  General exam: Appears calm and comfortable  Respiratory system: Bibasilar crackles.  No wheezing.  Fair air movement.  No use of accessory muscles of respiration. Cardiovascular system: S1 & S2 heard, RRR. No JVD, murmurs, rubs, gallops or clicks.  2-3+ bilateral lower extremity edema.  Gastrointestinal system: Abdomen is nondistended, soft and nontender. No organomegaly or masses felt. Normal bowel sounds heard. Central nervous system: Alert and oriented. No focal neurological deficits. Extremities: 2-3+ bilateral lower extremity edema.  Symmetric 5 x 5 power. Skin: No rashes, lesions or ulcers Psychiatry: Judgement and insight appear normal. Mood & affect appropriate.     Data Reviewed: I have personally reviewed following labs and imaging studies  CBC: Recent Labs  Lab 03/03/24 1600 03/04/24 0257 03/05/24 1000  WBC 6.0 5.3 5.0  HGB 11.5* 10.8* 12.0  HCT 35.2* 32.5* 36.2  MCV 82.8 82.7 83.2  PLT 176 153 173    Basic Metabolic Panel: Recent Labs  Lab 03/03/24 1600 03/03/24 1755 03/04/24 0257 03/05/24 1000  NA 138  --  141 138  K 3.0*  --  4.0 3.8  CL 102  --  104 100  CO2 24  --  27 26  GLUCOSE 139*  --  102* 111*  BUN 23  --  20 21  CREATININE 1.03*  --  0.99 1.13*   CALCIUM 9.1  --  9.2 9.1  MG  --  1.7  --  1.7    GFR: Estimated Creatinine Clearance: 30.1 mL/min (A) (by C-G formula based on SCr of 1.13 mg/dL (H)).  Liver Function Tests: Recent Labs  Lab 03/05/24 1000  AST 23  ALT 8  ALKPHOS 61  BILITOT 1.1  PROT 6.8  ALBUMIN 3.5    CBG: No results for input(s): GLUCAP in the last 168 hours.   No results found for this or any previous visit (from the past 240 hours).       Radiology Studies: DG Chest 2 View Result Date: 03/03/2024 CLINICAL DATA:  Progressively worsening shortness of breath x1 month. EXAM: CHEST - 2 VIEW COMPARISON:  January 09, 2023 FINDINGS: There is stable cardiac silhouette enlargement with a prominent left atrial appendage. There is marked severity calcification of the aortic arch. Mild atelectatic changes are noted within the bilateral lung bases with small bilateral pleural effusions. No pneumothorax is identified. Bilateral nipple shadows are present. Multilevel degenerative changes are present throughout the thoracic spine. IMPRESSION: Stable cardiomegaly with mild bibasilar  atelectasis with small bilateral pleural effusions. Electronically Signed   By: Suzen Dials M.D.   On: 03/03/2024 16:34        Scheduled Meds:  amLODipine   5 mg Oral q AM   cholecalciferol   1,000 Units Oral Daily   enoxaparin  (LOVENOX ) injection  40 mg Subcutaneous Q24H   furosemide   40 mg Intravenous BID   levothyroxine   25 mcg Oral QAC breakfast   montelukast   10 mg Oral QHS   Continuous Infusions:  magnesium  sulfate bolus IVPB       LOS: 1 day    Time spent: 40 minutes    Toribio Hummer, MD [facility]   To contact the attending provider between 7A-7P or the covering provider during after hours 7P-7A, please log into the web site [email or web address] and access using universal [place] password for that web site. If you do not have the password, please call the hospital operator.  03/05/2024, 12:10 PM     "

## 2024-03-05 NOTE — Progress Notes (Signed)
" °  Echocardiogram 2D Echocardiogram has been performed.  Dominique Ochoa 03/05/2024, 2:34 PM "

## 2024-03-05 NOTE — TOC CM/SW Note (Addendum)
 Transition of Care [facility]) - Inpatient Brief Assessment   Patient Details  Name: Ascencion Stegner MRN: [id number] Date of Birth: 03-02-1940  Transition of Care [facility]) CM/SW Contact:    Waddell Barnie Rama, RN Phone Number: 03/05/2024, 4:30 PM   Clinical Narrative: From home with son, indep, has PCP and insurance on file, states has no HH services in place at this time or DME at home.  States family member will transport them home at [employer] and family is support system, states gets medications from [facility] in [place], but she is ok with [facility] pharmacy filling meds prior to dc.  Pta self ambulatory.   There are no ICM needs identified  at this time.  Please place consult for ICM needs.  Patient states she does not need any HH services.   Transition of Care Asessment: Insurance and Status: Insurance coverage has been reviewed Patient has primary care physician: Yes Home environment has been reviewed: home with son Prior level of function:: indep Prior/Current Home Services: No current home services Social Drivers of Health Review: SDOH reviewed no interventions necessary Readmission risk has been reviewed: Yes Transition of care needs: no transition of care needs at this time

## 2024-03-05 NOTE — Consult Note (Addendum)
 "  Cardiology Consultation  Patient ID: Dominique Ochoa MRN: [id number]; DOB: [birth year]  Admit date: 03/03/2024 Date of Consult: 03/05/2024  PCP:  Minta Jon BROCKS, NP   [facility] Providers Cardiologist:  Jennifer JONELLE Crape, MD     Patient Profile: Dominique Ochoa is a 84 y.o. female with a hx of hypertension, paroxysmal SVT, OSA, asthma, GERD, hypothyroidism, CKD 3a, recent excision of abdominal mass 01/2024, who is being seen 03/05/2024 for the evaluation of CHF at the request of Dr. Sebastian.  History of Present Illness: [name] has past medical history stated above.  She presented to the ER at [facility] on 03/03/2024 complaining of shortness of breath over the last month.  She reported that there was nothing that necessarily made her shortness of breath better or worse, noting that she cannot walk far without becoming very short of breath.  Relevant workup includes: Metabolic panel overall unremarkable creatinine 1.03 ? 0.99 ? 1.13.  CBC unremarkable.  Troponin mildly elevated and flat 43 ? 45 ? 40.  proBNP significantly elevated 24,087. CXR shows cardiomegaly, small bilateral pleural effusions. EKG shows sinus with no acute ischemic changes. Echo ordered this admission, pending results.  She was virtually admitted from [facility] to [facility] for further evaluation and management.  She was given IV Lasix  60 mg x 1 dose and started on IV Lasix  40 mg twice daily starting 03/04/2024.Chart indicates that she has had net -3.3 L, noted to have 2.5 L out Feb. 4 thru 5.   She is a patient of Dr. Crape, last seen March 2025. Home medications include: amlodipine  5 mg daily.  His last note indicates that she been doing fairly well at this time.  Echocardiogram from February 2024: LVEF 60 to 65%, no RWMA, normal RV systolic function, moderately elevated PASP, mild LAE, mild to moderate MR, moderate TR, normal IVC.   After speaking with the patient, she  agrees with the history as stated above. She tells me that her sons tells her that she needs to be more meaningful regarding her salt intake with her diet. She tells me that she was slowly noticing that her tolerance for activity was decreasing and she was unable to complete simple tasks without having to take breaks. This was a progressive process over the last month. She has been receiving IV diuresis for about two days and reports significant improvement in her symptoms. She denies any tobacco, alcohol or illicit drug use.   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   Acute cholecystitis 04/03/2018   Adductovarus rotation of toe, acquired, left 10/10/2021   AKI (acute kidney injury) 09/25/2020   Asthma 09/16/2015   B12 deficiency 02/03/2018   Bowel obstruction (HCC) 04/16/2019   Cardiac murmur 03/13/2022   CKD (chronic kidney disease), 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   Corn of toe 10/10/2021   Does use hearing aid    Elevated troponin 04/03/2018   Essential hypertension 09/16/2015   Gallstone 04/03/2018   GERD (gastroesophageal reflux disease)    History of robot-assisted repair of inguinal hernia 09/25/2020   Formatting of this note might be different from the original. 8/22 bilateral   Hyperglycemia 09/16/2015   Hyperlipidemia 11/16/2019   Hypertension    Hypothyroidism 09/16/2015   Injury of right toe, sequela 04/20/2017   Mild intermittent asthma without complication 11/16/2019   Near syncope 04/20/2017   Normocytic anemia  04/20/2017   OSA (obstructive sleep apnea) 01/24/2017   Formatting of this note might be different from the original. Sx cleared after wt loss, no CPAP   Pain in the chest 09/16/2015   PSVT (paroxysmal supraventricular tachycardia) 05/22/2016   Sepsis (HCC) 04/03/2018   Thyroid  disease    Ventral hernia with bowel obstruction 04/16/2019   Vitamin D  deficiency disease 11/16/2019    Wears dentures    full   Past Surgical History:  Procedure Laterality Date   CHOLECYSTECTOMY N/A 04/04/2018   Procedure: LAPAROSCOPIC CHOLECYSTECTOMY WITH INTRAOPERATIVE CHOLANGIOGRAM;  Surgeon: Ethyl Lenis, MD;  Location: [facility] ORS;  Service: General;  Laterality: N/A;   EXCISION OF ABDOMINAL WALL TUMOR N/A 02/19/2024   Procedure: EXCISION, NEOPLASM, ABDOMINAL WALL;  Surgeon: Rubin Calamity, MD;  Location: [facility] OR;  Service: General;  Laterality: N/A;   EYE SURGERY Bilateral    cataracts removed   INSERTION OF MESH Bilateral 09/11/2020   Procedure: INSERTION OF MESH;  Surgeon: Rubin Calamity, MD;  Location: [facility] OR;  Service: General;  Laterality: Bilateral;   REPAIR, HERNIA, INGUINAL, BILATERAL, ROBOT-ASSISTED Bilateral 09/11/2020   Procedure: XI ROBOTIC ASSISTED BILATERAL INGUINAL HERNIA WITH MESH;  Surgeon: Rubin Calamity, MD;  Location: [facility] OR;  Service: General;  Laterality: Bilateral;    Home Medications:  Prior to Admission medications  Medication Sig Start Date End Date Taking? Authorizing Provider  albuterol  (PROVENTIL  HFA;VENTOLIN  HFA) 108 (90 Base) MCG/ACT inhaler Inhale 1 puff into the lungs every 6 (six) hours as needed for wheezing or shortness of breath.   Yes [provider]  albuterol  (PROVENTIL ) (2.5 MG/3ML) 0.083% nebulizer solution Take 2.5 mg by nebulization every 6 (six) hours as needed for wheezing or shortness of breath. 01/28/19  Yes [provider]  amLODipine  (NORVASC ) 5 MG tablet Take 5 mg by mouth in the morning.   Yes [provider]  cyanocobalamin (,VITAMIN B-12,) 1000 MCG/ML injection Inject 1,000 mcg into the muscle every 30 (thirty) days.   Yes [provider]  levothyroxine  (SYNTHROID , LEVOTHROID) 25 MCG tablet Take 25 mcg by mouth daily before breakfast.   Yes [provider]  montelukast  (SINGULAIR ) 10 MG tablet Take 10 mg by mouth at bedtime. 03/26/19  Yes [provider]  Multiple Vitamin  (MULTIVITAMIN WITH MINERALS) TABS tablet Take 1 tablet by mouth daily.   Yes [provider]  traMADol  (ULTRAM ) 50 MG tablet Take 1 tablet (50 mg total) by mouth every 6 (six) hours as needed. 02/19/24 02/18/25 Yes Rubin Calamity, MD  VITAMIN D  PO Take 1 tablet by mouth daily.   Yes [provider]   Scheduled Meds:  amLODipine   5 mg Oral q AM   cholecalciferol   1,000 Units Oral Daily   enoxaparin  (LOVENOX ) injection  40 mg Subcutaneous Q24H   furosemide   40 mg Intravenous BID   levothyroxine   25 mcg Oral QAC breakfast   montelukast   10 mg Oral QHS   Continuous Infusions:   PRN Meds: acetaminophen  **OR** acetaminophen , albuterol , ondansetron  **OR** ondansetron  (ZOFRAN ) IV, polyethylene glycol, traMADol   Allergies:   Allergies[1]  Social History:   Social History   Socioeconomic History   Marital status: Widowed    Spouse name: Not on file   Number of children: Not on file   Years of education: Not on file   Highest education level: Not on file  Occupational History   Occupation: retired  Tobacco Use   Smoking status: Never    Passive exposure: Never   Smokeless tobacco:  Never  Vaping Use   Vaping status: Never Used  Substance and Sexual Activity   Alcohol use: No   Drug use: No   Sexual activity: Not Currently    Birth control/protection: Post-menopausal  Other Topics Concern   Not on file  Social History Narrative   Not on file   Social Drivers of Health   Tobacco Use: Low Risk (03/03/2024)   Patient History    Smoking Tobacco Use: Never    Smokeless Tobacco Use: Never    Passive Exposure: Never  Financial Resource Strain: Not on file  Food Insecurity: Patient Declined (03/04/2024)   Epic    Worried About Programme Researcher, Broadcasting/film/video in the Last Year: Patient declined    Barista in the Last Year: Patient declined  Transportation Needs: No Transportation Needs (03/04/2024)   Epic    Lack of Transportation (Medical): No    Lack of Transportation  (Non-Medical): No  Physical Activity: Not on file  Stress: Not on file  Social Connections: Unknown (03/04/2024)   Social Connection and Isolation Panel    Frequency of Communication with Friends and Family: Three times a week    Frequency of Social Gatherings with Friends and Family: Three times a week    Attends Religious Services: More than 4 times per year    Active Member of Clubs or Organizations: Patient declined    Attends Banker Meetings: Patient declined    Marital Status: Widowed  Intimate Partner Violence: Not At Risk (03/04/2024)   Epic    Fear of Current or Ex-Partner: No    Emotionally Abused: No    Physically Abused: No    Sexually Abused: No  Depression (PHQ2-9): Not on file  Alcohol Screen: Not on file  Housing: Unknown (03/04/2024)   Epic    Unable to Pay for Housing in the Last Year: No    Number of Times Moved in the Last Year: 0    Homeless in the Last Year: Patient declined  Utilities: Not At Risk (03/04/2024)   Epic    Threatened with loss of utilities: No  Health Literacy: Not on file    Family History:   Family History  Problem Relation Age of Onset   Hypertension Son     ROS:  Please see the history of present illness.  All other ROS reviewed and negative.     Physical Exam/Data: Vitals:   03/05/24 0421 03/05/24 0715 03/05/24 1000 03/05/24 1150  BP:  113/74  120/81  Pulse:  78  88  Resp:  20  (!) 22  Temp:  97.8 F (36.6 C)  97.6 F (36.4 C)  TempSrc:  Oral  Oral  SpO2:  98% 94% 100%  Weight: 52.4 kg       Intake/Output Summary (Last 24 hours) at 03/05/2024 1507 Last data filed at 03/05/2024 1345 Gross per 24 hour  Intake 220 ml  Output 1650 ml  Net -1430 ml      03/05/2024    4:21 AM 03/03/2024    3:34 PM 02/19/2024    7:01 AM  Last 3 Weights  Weight (lbs) 115 lb 8.3 oz 127 lb 13.9 oz 126 lb  Weight (kg) 52.4 kg 58 kg 57.153 kg     Body mass index is 19.22 kg/m.   General:  in no acute distress, resting comfortably on  room air HEENT: normal Neck: no JVD Vascular:  Distal pulses 2+ bilaterally Cardiac:  normal S1, S2; RRR; +  SEM at apex  Lungs:  clear to auscultation bilaterally Abd: soft, nontender Ext: nonpitting puffy edema, improved per patient  Musculoskeletal:  No deformities Skin: warm and dry  Neuro:  no focal abnormalities noted Psych:  Normal affect   Telemetry:  Telemetry was personally reviewed and demonstrates:  sinus rhythm, frequent PVCs  Relevant CV Studies:  Echocardiogram, 03/05/2024 Ordered, pending results   Echocardiogram, 03/21/2022 Left ventricular ejection fraction, by estimation, is 60 to 65% . The left ventricle has normal function. The left ventricle has no regional wall motion abnormalities. Left ventricular diastolic parameters are indeterminate.  Right ventricular systolic function is normal. The right ventricular size is normal. There is moderately elevated pulmonary artery systolic pressure.  Left atrial size was mildly dilated.  The mitral valve is normal in structure. Mild to moderate mitral valve regurgitation. No evidence of mitral stenosis.  Tricuspid valve regurgitation is moderate. The aortic valve is calcified. There is mild calcification of the aortic valve. There is mild thickening of the aortic valve. Aortic valve regurgitation is trivial. Aortic valve sclerosis/ calcification is present, without any evidence of aortic stenosis.  The inferior vena cava is normal in size with greater than 50% respiratory variability, suggesting right atrial pressure of 3 mmHg.  Long-term monitor, 12/06/2019 Cardiac rhythm throughout is sinus with average, minimum maximum heart rates of 77, 56 and 156 bpm.  Right bundle branch block is noted.   There were no pauses of 3 seconds or greater and no episodes of second or third-degree AV node block or sinus node exit block.   Ventricular ectopy was frequent with PVCs 487 couplets 45 triplets and 4 runs of PVCs the longest 5  complexes.   Supraventricular ectopy was occasional there were 13 episodes of atrial tachycardia.  There were no episodes of atrial fibrillation or flutter.  There was one strip called sinus tachycardia which is atrial tachycardia.   Accelerated idioventricular rhythm is seen at a rate of 67 bpm.  Nuclear stress test, 09/17/2015 1. No reversible ischemia or infarction. 2. Normal left ventricular wall motion. 3. Left ventricular ejection fraction 79% 4. Non invasive risk stratification*: Low  Laboratory Data: High Sensitivity Troponin:  No results for input(s): TROPONINIHS in the last 720 hours.  Recent Labs  Lab 03/03/24 1600 03/03/24 1804 03/04/24 0755  TRNPT 43* 45* 40*      Chemistry Recent Labs  Lab 03/03/24 1600 03/03/24 1755 03/04/24 0257 03/05/24 1000  NA 138  --  141 138  K 3.0*  --  4.0 3.8  CL 102  --  104 100  CO2 24  --  27 26  GLUCOSE 139*  --  102* 111*  BUN 23  --  20 21  CREATININE 1.03*  --  0.99 1.13*  CALCIUM 9.1  --  9.2 9.1  MG  --  1.7  --  1.7  GFRNONAA 53*  --  55* 47*  ANIONGAP 13  --  10 12    Recent Labs  Lab 03/05/24 1000  PROT 6.8  ALBUMIN 3.5  AST 23  ALT 8  ALKPHOS 61  BILITOT 1.1   Lipids No results for input(s): CHOL, TRIG, HDL, LABVLDL, LDLCALC, CHOLHDL in the last 168 hours.  Hematology Recent Labs  Lab 03/03/24 1600 03/04/24 0257 03/05/24 1000  WBC 6.0 5.3 5.0  RBC 4.25 3.93 4.35  HGB 11.5* 10.8* 12.0  HCT 35.2* 32.5* 36.2  MCV 82.8 82.7 83.2  MCH 27.1 27.5 27.6  MCHC 32.7 33.2 33.1  RDW 15.5 15.4 15.4  PLT 176 153 173   Thyroid  No results for input(s): TSH, FREET4 in the last 168 hours.  BNP Recent Labs  Lab 03/03/24 1600  PROBNP 24,087.0*    DDimer No results for input(s): DDIMER in the last 168 hours.  Radiology/Studies:  ECHOCARDIOGRAM COMPLETE Result Date: 03/05/2024    ECHOCARDIOGRAM REPORT   Patient Name:   KEM PARCHER Date of Exam: 03/05/2024 Medical Rec #:  [id number]            Height:       65.0 in Accession #:    [id number]          Weight:       115.5 lb Date of Birth:  1940/06/23           BSA:          1.567 m Patient Age:    83 years            BP:           120/81 mmHg Patient Gender: F                   HR:           85 bpm. Exam Location:  Inpatient Procedure: 2D Echo (Both Spectral and Color Flow Doppler were utilized during            procedure). Indications:    acute diastolic chf  History:        Patient has prior history of Echocardiogram examinations, most                 recent 03/21/2022. CHF, chronic kidney disease; Risk                 Factors:Hypertension, Dyslipidemia and Sleep Apnea.  Sonographer:    Tinnie Barefoot RDCS Referring Phys: [id number] KELLY A GRIFFITH IMPRESSIONS  1. Left ventricular ejection fraction, by estimation, is 45 to 50%. The left ventricle has mildly decreased function. The left ventricle demonstrates global hypokinesis. There is mild left ventricular hypertrophy. Left ventricular diastolic parameters are indeterminate.  2. Right ventricular systolic function is mildly reduced. The right ventricular size is mildly enlarged. There is moderately elevated pulmonary artery systolic pressure. The estimated right ventricular systolic pressure is 57.0 mmHg.  3. Left atrial size was severely dilated.  4. A small pericardial effusion is present.  5. The mitral valve is normal in structure. Mild to moderate mitral valve regurgitation. No evidence of mitral stenosis.  6. Tricuspid valve regurgitation is moderate.  7. The aortic valve is tricuspid. Aortic valve regurgitation is mild. Aortic valve sclerosis/calcification is present, without any evidence of aortic stenosis.  8. The inferior vena cava is dilated in size with <50% respiratory variability, suggesting right atrial pressure of 15 mmHg. FINDINGS  Left Ventricle: Left ventricular ejection fraction, by estimation, is 45 to 50%. The left ventricle has mildly decreased function. The left ventricle  demonstrates global hypokinesis. The left ventricular internal cavity size was normal in size. There is  mild left ventricular hypertrophy. Left ventricular diastolic parameters are indeterminate. Right Ventricle: The right ventricular size is mildly enlarged. No increase in right ventricular wall thickness. Right ventricular systolic function is mildly reduced. There is moderately elevated pulmonary artery systolic pressure. The tricuspid regurgitant velocity is 3.24 m/s, and with an assumed right atrial pressure of 15 mmHg, the estimated right ventricular systolic pressure is 57.0 mmHg. Left Atrium: Left atrial size was severely dilated. Right  Atrium: Right atrial size was normal in size. Pericardium: A small pericardial effusion is present. Mitral Valve: The mitral valve is normal in structure. Mild to moderate mitral valve regurgitation. No evidence of mitral valve stenosis. Tricuspid Valve: The tricuspid valve is normal in structure. Tricuspid valve regurgitation is moderate. Aortic Valve: The aortic valve is tricuspid. Aortic valve regurgitation is mild. Aortic regurgitation PHT measures 644 msec. Aortic valve sclerosis/calcification is present, without any evidence of aortic stenosis. Pulmonic Valve: The pulmonic valve was not well visualized. Pulmonic valve regurgitation is trivial. Aorta: The aortic root and ascending aorta are structurally normal, with no evidence of dilitation. Venous: The inferior vena cava is dilated in size with less than 50% respiratory variability, suggesting right atrial pressure of 15 mmHg. IAS/Shunts: The interatrial septum was not well visualized.  LEFT VENTRICLE PLAX 2D LVIDd:         5.30 cm     Diastology LVIDs:         4.40 cm     LV e' medial:  4.90 cm/s LV PW:         1.10 cm     LV e' lateral: 5.98 cm/s LV IVS:        1.10 cm LVOT diam:     1.90 cm LV SV:         29 LV SV Index:   18 LVOT Area:     2.84 cm  LV Volumes (MOD) LV vol d, MOD A2C: 72.5 ml LV vol d, MOD A4C:  76.0 ml LV vol s, MOD A2C: 37.4 ml LV vol s, MOD A4C: 45.2 ml LV SV MOD A2C:     35.1 ml LV SV MOD A4C:     76.0 ml LV SV MOD BP:      33.5 ml RIGHT VENTRICLE            IVC RV Basal diam:  3.30 cm    IVC diam: 2.20 cm RV S prime:     8.81 cm/s                            PULMONARY VEINS                            Diastolic Velocity: 45.50 cm/s                            S/D Velocity:       1.00                            Systolic Velocity:  47.50 cm/s LEFT ATRIUM           Index        RIGHT ATRIUM           Index LA diam:      4.80 cm 3.06 cm/m   RA Area:     18.70 cm LA Vol (A4C): 81.3 ml 51.90 ml/m  RA Volume:   45.80 ml  29.24 ml/m  AORTIC VALVE             PULMONIC VALVE LVOT Vmax:   62.90 cm/s  PR End Diast Vel: 9.67 msec LVOT Vmean:  39.200 cm/s LVOT VTI:    0.102 m AI PHT:      644 msec  AORTA Ao Root diam:  2.90 cm Ao Asc diam:  3.30 cm MR Peak grad:    90.6 mmHg    TRICUSPID VALVE MR Mean grad:    55.0 mmHg    TR Peak grad:   42.0 mmHg MR Vmax:         476.00 cm/s  TR Vmax:        324.00 cm/s MR Vmean:        343.0 cm/s MR PISA:         1.01 cm     SHUNTS MR PISA Eff ROA: 8 mm        Systemic VTI:  0.10 m MR PISA Radius:  0.40 cm      Systemic Diam: 1.90 cm Lonni Nanas MD Electronically signed by Lonni Nanas MD Signature Date/Time: 03/05/2024/2:50:00 PM    Final    DG Chest 2 View Result Date: 03/03/2024 CLINICAL DATA:  Progressively worsening shortness of breath x1 month. EXAM: CHEST - 2 VIEW COMPARISON:  January 09, 2023 FINDINGS: There is stable cardiac silhouette enlargement with a prominent left atrial appendage. There is marked severity calcification of the aortic arch. Mild atelectatic changes are noted within the bilateral lung bases with small bilateral pleural effusions. No pneumothorax is identified. Bilateral nipple shadows are present. Multilevel degenerative changes are present throughout the thoracic spine. IMPRESSION: Stable cardiomegaly with mild bibasilar  atelectasis with small bilateral pleural effusions. Electronically Signed   By: Suzen Dials M.D.   On: 03/03/2024 16:34   Assessment and Plan:  Acute CHF Hypertension Presented with progressive dyspnea, bilateral LE edema proBNP 24,087 CXR showed cardiomegaly, pleural effusions Started on IV Lasix  Creatinine 1.03 ? 0.99 ? 1.13, BUN stable Troponin 43 ? 45 ? 40  Echo 02/2022: LVEF 60 to 65%, normal RV systolic function, moderately elevated PASP Pending echo this admission Normotensive, most recent BP 120/81 Net -3.9 L so far this admission  Updating echocardiogram, will follow results -- may need to add on GDMT pending results Pending EF, may benefit from coronary CTA as an outpatient to evaluate  Currently on IV Lasix  40 mg BID, continue at this dose  Continue to monitor strict I's and O's, daily weights, daily BMPs Will discontinue amlodipine  to leave room for GDMT Consider starting SGLT2i today, will check prices with pharmacy  Suspect can add BB tomorrow and ARB/ARNI/MRA once not on significant IV diuresis   Mild to moderate MR Moderate TR Noted on echo from February 2024 Patient presents progressive dyspnea, lower extremity edema Updating echocardiogram, will follow results  Frequent PVCs Paroxysmal SVT Noted 2.5% burden on monitor from 11/2019 On telemetry has frequent PVCs Reports palpitations Would likely benefit from repeat monitor as an outpatient to assess burden  Continue to monitor and replete electrolytes  Continue to monitor on telemetry   Per primary Asthma GERD CKD stage 3a Electrolyte disturbances  Hypothyroidism  Risk Assessment/Risk Scores:      [place]  Heart Association (NYHA) Functional Class NYHA Class II   For questions or updates, please contact [facility] Please consult [email or web address] for contact info under    Signed, Waddell DELENA Donath, PA-C  03/05/2024 3:07 PM     [1]  Allergies Allergen Reactions   Fish Allergy  Other (See Comments)    Makes her weak   Fish Oil Other (See Comments) and Swelling    MD orders   Lisinopril  Swelling    Facial swelling   Sulfamethoxazole-Trimethoprim Itching and Swelling    Facial swelling   "

## 2024-03-05 NOTE — Telephone Encounter (Signed)
 Pharmacy Patient Advocate Encounter  Insurance verification completed.    The patient is insured through [facility]. Patient has Medicare and is not eligible for a copay card, but may be able to apply for patient assistance or Medicare RX Payment Plan (Patient Must reach out to their plan, if eligible for payment plan), if available.    Ran test claim for Jardiance 10mg  tablet and the current 30 day co-pay is $12.65.  Ran test claim for Farxiga  10mg  tablet and the current 30 day co-pay is $12.65.  Ran test claim for Entresto 24-26mg  tablet and the current 30 day co-pay is $5.10.  This test claim was processed through [facility]- copay amounts may vary at other pharmacies due to pharmacy/plan contracts, or as the patient moves through the different stages of their insurance plan.
